# Patient Record
Sex: Male | Born: 1960
Health system: Southern US, Community
[De-identification: ages and names within clinical notes are randomized; demographics above are authoritative.]

## PROBLEM LIST (undated history)

## (undated) DIAGNOSIS — M549 Dorsalgia, unspecified: Secondary | ICD-10-CM

## (undated) DIAGNOSIS — E785 Hyperlipidemia, unspecified: Secondary | ICD-10-CM

## (undated) DIAGNOSIS — Z9189 Other specified personal risk factors, not elsewhere classified: Secondary | ICD-10-CM

## (undated) DIAGNOSIS — Z87442 Personal history of urinary calculi: Secondary | ICD-10-CM

## (undated) DIAGNOSIS — N529 Male erectile dysfunction, unspecified: Secondary | ICD-10-CM

## (undated) DIAGNOSIS — U071 COVID-19: Secondary | ICD-10-CM

## (undated) DIAGNOSIS — M810 Age-related osteoporosis without current pathological fracture: Secondary | ICD-10-CM

## (undated) DIAGNOSIS — I10 Essential (primary) hypertension: Secondary | ICD-10-CM

## (undated) HISTORY — DX: Hyperlipidemia, unspecified: E78.5

## (undated) HISTORY — PX: FRACTURE SURGERY: SHX138

## (undated) HISTORY — PX: CYST EXCISION: SHX5701

## (undated) HISTORY — DX: Other specified personal risk factors, not elsewhere classified: Z91.89

## (undated) HISTORY — DX: Male erectile dysfunction, unspecified: N52.9

## (undated) HISTORY — DX: Dorsalgia, unspecified: M54.9

## (undated) HISTORY — DX: Age-related osteoporosis without current pathological fracture: M81.0

## (undated) HISTORY — PX: SPINE SURGERY: SHX786

## (undated) HISTORY — PX: TONSILLECTOMY: SUR1361

## (undated) HISTORY — DX: Essential (primary) hypertension: I10

## (undated) HISTORY — PX: BACK SURGERY: SHX140

## (undated) HISTORY — PX: VASECTOMY: SHX75

---

## 1993-06-10 DIAGNOSIS — Z87442 Personal history of urinary calculi: Secondary | ICD-10-CM

## 1993-06-10 HISTORY — DX: Personal history of urinary calculi: Z87.442

## 2000-09-30 ENCOUNTER — Ambulatory Visit (HOSPITAL_COMMUNITY): Admission: RE | Admit: 2000-09-30 | Discharge: 2000-09-30 | Payer: Self-pay | Admitting: Neurological Surgery

## 2000-09-30 ENCOUNTER — Encounter: Payer: Self-pay | Admitting: Neurological Surgery

## 2000-10-02 ENCOUNTER — Encounter: Admission: RE | Admit: 2000-10-02 | Discharge: 2000-11-25 | Payer: Self-pay | Admitting: Neurological Surgery

## 2001-03-02 ENCOUNTER — Encounter: Payer: Self-pay | Admitting: Neurological Surgery

## 2001-03-02 ENCOUNTER — Ambulatory Visit (HOSPITAL_COMMUNITY): Admission: RE | Admit: 2001-03-02 | Discharge: 2001-03-02 | Payer: Self-pay | Admitting: Neurological Surgery

## 2001-03-24 ENCOUNTER — Encounter: Payer: Self-pay | Admitting: Internal Medicine

## 2001-03-24 ENCOUNTER — Ambulatory Visit (HOSPITAL_COMMUNITY): Admission: RE | Admit: 2001-03-24 | Discharge: 2001-03-24 | Payer: Self-pay | Admitting: Internal Medicine

## 2003-03-24 ENCOUNTER — Encounter: Payer: Self-pay | Admitting: Emergency Medicine

## 2003-03-24 ENCOUNTER — Observation Stay (HOSPITAL_COMMUNITY): Admission: EM | Admit: 2003-03-24 | Discharge: 2003-03-25 | Payer: Self-pay | Admitting: Emergency Medicine

## 2003-03-25 ENCOUNTER — Encounter: Payer: Self-pay | Admitting: Internal Medicine

## 2004-05-29 ENCOUNTER — Ambulatory Visit: Payer: Self-pay | Admitting: Internal Medicine

## 2004-05-30 ENCOUNTER — Ambulatory Visit: Payer: Self-pay | Admitting: Internal Medicine

## 2005-09-04 ENCOUNTER — Emergency Department (HOSPITAL_COMMUNITY): Admission: EM | Admit: 2005-09-04 | Discharge: 2005-09-04 | Payer: Self-pay | Admitting: Emergency Medicine

## 2006-09-08 ENCOUNTER — Ambulatory Visit: Payer: Self-pay | Admitting: Internal Medicine

## 2006-09-10 LAB — CONVERTED CEMR LAB
Albumin: 4.5 g/dL (ref 3.5–5.2)
BUN: 14 mg/dL (ref 6–23)
Basophils Absolute: 0 10*3/uL (ref 0.0–0.1)
Bilirubin Urine: NEGATIVE
CO2: 32 meq/L (ref 19–32)
Chloride: 98 meq/L (ref 96–112)
Cholesterol: 213 mg/dL (ref 0–200)
Creatinine, Ser: 1.1 mg/dL (ref 0.4–1.5)
Eosinophils Absolute: 0.1 10*3/uL (ref 0.0–0.6)
Eosinophils Relative: 2.2 % (ref 0.0–5.0)
Ketones, ur: NEGATIVE mg/dL
Monocytes Relative: 11.1 % — ABNORMAL HIGH (ref 3.0–11.0)
Neutro Abs: 3.1 10*3/uL (ref 1.4–7.7)
Neutrophils Relative %: 50.6 % (ref 43.0–77.0)
Nitrite: NEGATIVE
Sodium: 136 meq/L (ref 135–145)
Specific Gravity, Urine: 1.015 (ref 1.000–1.03)
Urobilinogen, UA: 0.2 (ref 0.0–1.0)
VLDL: 15 mg/dL (ref 0–40)
pH: 8 (ref 5.0–8.0)

## 2006-10-24 ENCOUNTER — Emergency Department (HOSPITAL_COMMUNITY): Admission: EM | Admit: 2006-10-24 | Discharge: 2006-10-24 | Payer: Self-pay | Admitting: Emergency Medicine

## 2007-10-14 ENCOUNTER — Encounter: Payer: Self-pay | Admitting: *Deleted

## 2007-10-14 DIAGNOSIS — M549 Dorsalgia, unspecified: Secondary | ICD-10-CM | POA: Insufficient documentation

## 2007-10-14 DIAGNOSIS — F528 Other sexual dysfunction not due to a substance or known physiological condition: Secondary | ICD-10-CM | POA: Insufficient documentation

## 2007-10-14 DIAGNOSIS — Z9089 Acquired absence of other organs: Secondary | ICD-10-CM | POA: Insufficient documentation

## 2007-10-14 DIAGNOSIS — I10 Essential (primary) hypertension: Secondary | ICD-10-CM | POA: Insufficient documentation

## 2007-10-14 DIAGNOSIS — Z9189 Other specified personal risk factors, not elsewhere classified: Secondary | ICD-10-CM | POA: Insufficient documentation

## 2007-12-17 ENCOUNTER — Telehealth: Payer: Self-pay | Admitting: Internal Medicine

## 2008-03-11 ENCOUNTER — Ambulatory Visit: Payer: Self-pay | Admitting: Internal Medicine

## 2008-03-11 LAB — CONVERTED CEMR LAB
AST: 27 units/L (ref 0–37)
Albumin: 4.3 g/dL (ref 3.5–5.2)
Basophils Absolute: 0.1 10*3/uL (ref 0.0–0.1)
Bilirubin Urine: NEGATIVE
Bilirubin, Direct: 0.2 mg/dL (ref 0.0–0.3)
Eosinophils Relative: 3.1 % (ref 0.0–5.0)
GFR calc non Af Amer: 85 mL/min
HCT: 40.6 % (ref 39.0–52.0)
Hemoglobin, Urine: NEGATIVE
Leukocytes, UA: NEGATIVE
MCHC: 34.8 g/dL (ref 30.0–36.0)
MCV: 90 fL (ref 78.0–100.0)
Monocytes Absolute: 0.6 10*3/uL (ref 0.1–1.0)
Monocytes Relative: 9.8 % (ref 3.0–12.0)
Neutro Abs: 3.4 10*3/uL (ref 1.4–7.7)
Nitrite: NEGATIVE
PSA: 0.59 ng/mL (ref 0.10–4.00)
TSH: 1.47 microintl units/mL (ref 0.35–5.50)
Total Protein: 7.6 g/dL (ref 6.0–8.3)
Triglycerides: 90 mg/dL (ref 0–149)
Urine Glucose: NEGATIVE mg/dL
Urobilinogen, UA: 0.2 (ref 0.0–1.0)
VLDL: 18 mg/dL (ref 0–40)

## 2009-01-17 ENCOUNTER — Telehealth: Payer: Self-pay | Admitting: Internal Medicine

## 2009-04-14 ENCOUNTER — Ambulatory Visit: Payer: Self-pay | Admitting: Internal Medicine

## 2009-04-14 LAB — CONVERTED CEMR LAB
AST: 22 units/L (ref 0–37)
Albumin: 4.5 g/dL (ref 3.5–5.2)
BUN: 15 mg/dL (ref 6–23)
Basophils Absolute: 0 10*3/uL (ref 0.0–0.1)
Calcium: 9.6 mg/dL (ref 8.4–10.5)
Chloride: 99 meq/L (ref 96–112)
Creatinine, Ser: 1.1 mg/dL (ref 0.4–1.5)
Glucose, Bld: 105 mg/dL — ABNORMAL HIGH (ref 70–99)
HDL: 55.5 mg/dL (ref >39.00)
Ketones, ur: NEGATIVE mg/dL
MCV: 92.1 fL (ref 78.0–100.0)
Monocytes Absolute: 0.6 10*3/uL (ref 0.1–1.0)
Neutrophils Relative %: 59 % (ref 43.0–77.0)
Nitrite: NEGATIVE
RBC: 4.38 M/uL (ref 4.22–5.81)
Specific Gravity, Urine: 1.01 (ref 1.000–1.030)
TSH: 1.87 microintl units/mL (ref 0.35–5.50)
Urine Glucose: NEGATIVE mg/dL
VLDL: 14 mg/dL (ref 0.0–40.0)
WBC: 6.5 10*3/uL (ref 4.5–10.5)

## 2009-04-18 ENCOUNTER — Ambulatory Visit: Payer: Self-pay | Admitting: Internal Medicine

## 2009-04-18 DIAGNOSIS — E785 Hyperlipidemia, unspecified: Secondary | ICD-10-CM | POA: Insufficient documentation

## 2009-05-10 ENCOUNTER — Telehealth: Payer: Self-pay | Admitting: Internal Medicine

## 2009-06-01 ENCOUNTER — Ambulatory Visit: Payer: Self-pay | Admitting: Internal Medicine

## 2009-06-01 LAB — CONVERTED CEMR LAB
Cholesterol: 191 mg/dL (ref 0–200)
HDL: 69.2 mg/dL (ref >39.00)

## 2009-06-05 ENCOUNTER — Encounter: Payer: Self-pay | Admitting: Internal Medicine

## 2010-03-16 ENCOUNTER — Ambulatory Visit: Payer: Self-pay | Admitting: Internal Medicine

## 2010-04-17 ENCOUNTER — Ambulatory Visit: Payer: Self-pay | Admitting: Internal Medicine

## 2010-04-17 LAB — CONVERTED CEMR LAB
Basophils Relative: 0.6 % (ref 0.0–3.0)
Bilirubin Urine: NEGATIVE
Bilirubin, Direct: 0.1 mg/dL (ref 0.0–0.3)
CO2: 30 meq/L (ref 19–32)
Calcium: 10.1 mg/dL (ref 8.4–10.5)
Creatinine, Ser: 0.9 mg/dL (ref 0.4–1.5)
Eosinophils Absolute: 0.3 10*3/uL (ref 0.0–0.7)
GFR calc non Af Amer: 90.32 mL/min (ref >60)
HCT: 39.3 % (ref 39.0–52.0)
HDL: 62.2 mg/dL (ref >39.00)
Hemoglobin, Urine: NEGATIVE
LDL Cholesterol: 99 mg/dL (ref 0–99)
Lymphocytes Relative: 35.6 % (ref 12.0–46.0)
Lymphs Abs: 2.5 10*3/uL (ref 0.7–4.0)
MCV: 91.4 fL (ref 78.0–100.0)
Monocytes Absolute: 0.7 10*3/uL (ref 0.1–1.0)
Neutro Abs: 3.4 10*3/uL (ref 1.4–7.7)
Neutrophils Relative %: 49.4 % (ref 43.0–77.0)
Platelets: 272 10*3/uL (ref 150.0–400.0)
Potassium: 5.4 meq/L — ABNORMAL HIGH (ref 3.5–5.1)
Total CHOL/HDL Ratio: 3
Total Protein, Urine: NEGATIVE mg/dL
Urine Glucose: NEGATIVE mg/dL
VLDL: 22.8 mg/dL (ref 0.0–40.0)
WBC: 6.9 10*3/uL (ref 4.5–10.5)
pH: 5.5 (ref 5.0–8.0)

## 2010-04-23 ENCOUNTER — Encounter: Payer: Self-pay | Admitting: Internal Medicine

## 2010-04-23 ENCOUNTER — Ambulatory Visit: Payer: Self-pay | Admitting: Internal Medicine

## 2010-07-10 NOTE — Assessment & Plan Note (Signed)
Summary: cpx/bcbs/jss   Vital Signs:  Patient profile:   50 year old male Height:      68 inches Weight:      163 pounds BMI:     24.87 O2 Sat:      97 % on Room air Temp:     98.5 degrees F oral Pulse rate:   79 / minute BP sitting:   124 / 90  (left arm) Cuff size:   regular  Vitals Entered By: Bill Salinas CMA (April 23, 2010 1:38 PM)  O2 Flow:  Room air CC: cpx/ ab  Vision Screening:      Vision Comments: normal eye exam with Dr Lorin Picket sept 2011   Primary Care Provider:  Jacques Navy MD  CC:  cpx/ ab.  History of Present Illness: Patient presents for annual physical exam. He is doing well with no new medical problems Shoulder/upper back pain is a lot better. He is interested in Vasectomy.   Current Medications (verified): 1)  Lotensin Hct 20-25 Mg  Tabs (Benazepril-Hydrochlorothiazide) .... Take Once Daily 2)  Simvastatin 20 Mg Tabs (Simvastatin) .Marland Kitchen.. 1 By Mouth Qpm For Cholesterol 3)  Cialis 20 Mg Tabs (Tadalafil) .Marland Kitchen.. 1 Tab Prn 4)  Cyclobenzaprine Hcl 10 Mg Tabs (Cyclobenzaprine Hcl) .Marland Kitchen.. 1 By Mouth Three Times A Day For Back Spasm  Allergies (verified): No Known Drug Allergies  Past History:  Past Medical History: Last updated: 10/14/2007 Hx of ERECTILE DYSFUNCTION, MILD (ICD-302.72) HYPERTENSION (ICD-401.9) Hx of BACK PAIN (ICD-724.5) CHICKENPOX, HX OF (ICD-V15.9)  Past Surgical History: Last updated: 10/14/2007 TONSILLECTOMY, HX OF (ICD-V45.79)    Family History: Last updated: 2008/03/14 Father - deceased @ 45 - leukemia, CAD/PCI-PTCA Mother - 1938: Parkinson's, macular degeneration Neg- Prostate or colon cancer  Social History: [employer] 2nd marriage '06-'08; 2rd marriage Dec '10 1 son - '91, step-son '91, step-daughter '93 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    Physical Exam  General:  WNWD white male in no distress Head:  normocephalic, atraumatic, and no abnormalities observed.   Eyes:  vision grossly intact, pupils equal, pupils round, pupils reactive to light, corneas and lenses clear, and no injection.   Ears:  External ear exam shows no significant lesions or deformities.  Otoscopic examination reveals clear canals, tympanic membranes are intact bilaterally without bulging, retraction, inflammation or discharge. Hearing is grossly normal bilaterally. Nose:  no external deformity and no external erythema.   Mouth:  Oral mucosa and oropharynx without lesions or exudates.  Teeth in good repair. Neck:  supple, full ROM, no masses, no thyromegaly, and no carotid bruits.   Chest Wall:  no deformities, no tenderness, and no masses.   Lungs:  Normal respiratory effort, chest expands symmetrically. Lungs are clear to auscultation, no crackles or wheezes. Heart:  Normal rate and regular rhythm. S1 and S2 normal without gallop, murmur, click, rub or other extra sounds. Abdomen:  soft, non-tender, normal bowel sounds, no distention, no guarding, and no hepatomegaly.   Prostate:  deferred to normal PSA Msk:  normal ROM, no joint tenderness, no joint swelling, no joint warmth, no joint  deformities, and no joint instability.   Pulses:  2+ radial and DP pulses Extremities:  No clubbing, cyanosis, edema, or deformity noted with normal full range of motion of all joints.   Neurologic:  alert & oriented X3, cranial nerves II-XII intact, strength normal in all extremities, sensation intact to light touch, gait normal, and DTRs symmetrical  and normal.   Skin:  turgor normal, color normal, no rashes, no suspicious lesions, and no ulcerations.   Cervical Nodes:  no anterior cervical adenopathy and no posterior cervical adenopathy.   Psych:  Oriented X3, memory intact for recent and remote, normally interactive, good eye contact, and not anxious appearing.     Impression & Recommendations:  Problem # 1:  HYPERLIPIDEMIA, MILD (ICD-272.4) Excellent response to low dose simvastatin with LDL 99!  Plan - continue present dose.  His updated medication list for this problem includes:    Simvastatin 20 Mg Tabs (Simvastatin) .Marland Kitchen... 1 by mouth qpm for cholesterol  Problem # 2:  HYPERTENSION (ICD-401.9)  His updated medication list for this problem includes:    Lotensin Hct 20-25 Mg Tabs (Benazepril-hydrochlorothiazide) .Marland Kitchen... Take once daily  BP today: 124/90 Prior BP: 120/82 (03/16/2010)  Very good control. Tolerating medication without problems.  Plan - continue present medications.  Problem # 3:  Hx of BACK PAIN (ICD-724.5) Doing well.  Plan - myofascial release using tennis ball or alabaster egg to the rhomboids and to the pectoralis major insertion.  His updated medication list for this problem includes:    Cyclobenzaprine Hcl 10 Mg Tabs (Cyclobenzaprine hcl) .Marland Kitchen... 1 by mouth three times a day for back spasm  Problem # 4:  Preventive Health Care (ICD-V70.0) Unremarkable history. Exam is normal. Lab results are excellent. Current with immunizations. Is at the age for colonoscopy. Will refer to GI. EKG with T-wave inversion V1 and V2 which is most likely a normal variant given absence of signs or symptoms of ischemia.   In summary - Patient is doing well.             Will refer to Dr. Isabel Caprice for vasectomy  Complete Medication List: 1)  Lotensin Hct 20-25 Mg Tabs (Benazepril-hydrochlorothiazide) .... Take once daily 2)  Simvastatin 20 Mg Tabs (Simvastatin) .Marland Kitchen.. 1 by mouth qpm for cholesterol 3)  Cialis 20 Mg Tabs  (Tadalafil) .Marland Kitchen.. 1 tab prn 4)  Cyclobenzaprine Hcl 10 Mg Tabs (Cyclobenzaprine hcl) .Marland Kitchen.. 1 by mouth three times a day for back spasm  Other Orders: Admin 1st Vaccine (16109) Flu Vaccine 6yrs + (60454) Urology Referral (Urology) EKG w/ Interpretation (93000)   Patient: Devin Stanton Note: All result statuses are Final unless otherwise noted.  Tests: (1) Lipid Panel (LIPID)   Cholesterol               184 mg/dL                   0-981     ATP III Classification            Desirable:  < 200 mg/dL                    Borderline High:  200 - 239 mg/dL               High:  > = 240 mg/dL   Triglycerides             114.0 mg/dL  0.0-149.0     Normal:  <150 mg/dL     Borderline High:  045 - 199 mg/dL   HDL                       40.98 mg/dL                 >11.91   VLDL Cholesterol          22.8 mg/dL                  4.7-82.9   LDL Cholesterol           99 mg/dL                    5-62  CHO/HDL Ratio:  CHD Risk                             3                    Men          Women     1/2 Average Risk     3.4          3.3     Average Risk          5.0          4.4     2X Average Risk          9.6          7.1     3X Average Risk          15.0          11.0                           Tests: (2) BMP (METABOL)   Sodium                    138 mEq/L                   135-145   Potassium            [H]  5.4 mEq/L                   3.5-5.1     specimen not hemolyzed   Chloride                  101 mEq/L                   96-112   Carbon Dioxide            30 mEq/L                    19-32   Glucose              [H]  104 mg/dL                   13-08   BUN                       22 mg/dL                    6-57   Creatinine                0.9  mg/dL                   1.6-1.0   Calcium                   10.1 mg/dL                  9.6-04.5   GFR                       90.32 mL/min                >60  Tests: (3) CBC Platelet w/Diff (CBCD)   White Cell Count          6.9 K/uL                     4.5-10.5   Red Cell Count            4.30 Mil/uL                 4.22-5.81   Hemoglobin                13.5 g/dL                   40.9-81.1   Hematocrit                39.3 %                      39.0-52.0   MCV                       91.4 fl                     78.0-100.0   MCHC                      34.4 g/dL                   91.4-78.2   RDW                       12.5 %                      11.5-14.6   Platelet Count            272.0 K/uL                  150.0-400.0   Neutrophil %              49.4 %                      43.0-77.0   Lymphocyte %              35.6 %                      12.0-46.0   Monocyte %                10.8 %                      3.0-12.0   Eosinophils%              3.6 %  0.0-5.0   Basophils %               0.6 %                       0.0-3.0   Neutrophill Absolute      3.4 K/uL                    1.4-7.7   Lymphocyte Absolute       2.5 K/uL                    0.7-4.0   Monocyte Absolute         0.7 K/uL                    0.1-1.0  Eosinophils, Absolute                             0.3 K/uL                    0.0-0.7   Basophils Absolute        0.0 K/uL                    0.0-0.1  Tests: (4) Hepatic/Liver Function Panel (HEPATIC)   Total Bilirubin           0.7 mg/dL                   5.3-6.6   Direct Bilirubin          0.1 mg/dL                   4.4-0.3   Alkaline Phosphatase      66 U/L                      39-117   AST                       26 U/L                      0-37   ALT                       31 U/L                      0-53   Total Protein             7.3 g/dL                    4.7-4.2   Albumin                   4.5 g/dL                    5.9-5.6  Tests: (5) TSH (TSH)   FastTSH                   1.40 uIU/mL                 0.35-5.50  Tests: (6) UDip Only (UDIP)   Color                     LT. YELLOW       RANGE:  Yellow;Lt.  Yellow   Clarity                   CLEAR                       Clear   Specific  Gravity          1.025                       1.000 - 1.030   Urine Ph                  5.5                         5.0-8.0   Protein                   NEGATIVE                    Negative   Urine Glucose             NEGATIVE                    Negative   Ketones                   NEGATIVE                    Negative   Urine Bilirubin           NEGATIVE                    Negative   Blood                     NEGATIVE                    Negative   Urobilinogen              0.2                         0.0 - 1.0   Leukocyte Esterace        NEGATIVE                    Negative   Nitrite                   NEGATIVE                    Negative  Tests: (7) Prostate Specific Antigen (PSA)   PSA-Hyb                   0.58 ng/mL                  0.10-4.00  Orders Added: 1)  Admin 1st Vaccine [90471] 2)  Flu Vaccine 42yrs + [29562] 3)  Urology Referral [Urology] 4)  Est. Patient 40-64 years [99396] 5)  EKG w/ Interpretation [93000]  Flu Vaccine Consent Questions     Do you have a history of severe allergic reactions to this vaccine? no    Any prior history of allergic reactions to egg and/or gelatin? no    Do you have a sensitivity to the preservative Thimersol? no    Do you have a past history of Guillan-Barre Syndrome? no    Do you  currently have an acute febrile illness? no    Have you ever had a severe reaction to latex? no    Vaccine information given and explained to patient? yes    Are you currently pregnant? no    Lot Number:[id number]   Exp Date:12/08/2010   Site Given  Left Deltoid [id number]]          .[email or web address]

## 2010-07-10 NOTE — Assessment & Plan Note (Signed)
Summary: ?pulled muscle in the middle of his back-lb   Vital Signs:  Patient profile:   50 year old male Height:      68 inches Weight:      160 pounds BMI:     24.42 O2 Sat:      96 % on Room air Temp:     97.3 degrees F oral Pulse rate:   74 / minute BP sitting:   120 / 82  (left arm) Cuff size:   regular  Vitals Entered By: Bill Salinas CMA (March 16, 2010 4:13 PM)  O2 Flow:  Room air CC: ov for evaluation of pulled muscle in back/ ab   Primary Care Provider:  Jacques Navy MD  CC:  ov for evaluation of pulled muscle in back/ ab.  History of Present Illness: Patient presents for acute on-set low thoracic level back pain. He had pain associated with a golf swing. He has had no paresthesia, muscle weakness. He reports pain with movement and with deep inspiration. He has pain if he coughs or laughs. No prior injury of this type.  Current Medications (verified): 1)  Lotensin Hct 20-25 Mg  Tabs (Benazepril-Hydrochlorothiazide) .... Take Once Daily 2)  Simvastatin 20 Mg Tabs (Simvastatin) .Marland Kitchen.. 1 By Mouth Qpm For Cholesterol 3)  Cialis 20 Mg Tabs (Tadalafil) .Marland Kitchen.. 1 Tab Prn  Allergies (verified): No Known Drug Allergies  Past History:  Past Medical History: Last updated: 10/14/2007 Hx of ERECTILE DYSFUNCTION, MILD (ICD-302.72) HYPERTENSION (ICD-401.9) Hx of BACK PAIN (ICD-724.5) CHICKENPOX, HX OF (ICD-V15.9)  Past Surgical History: Last updated: 10/14/2007 TONSILLECTOMY, HX OF (ICD-V45.79)   PSH reviewed for relevance, FH reviewed for relevance  Review of Systems  The patient denies anorexia, weight loss, chest pain, syncope, dyspnea on exertion, abdominal pain, severe indigestion/heartburn, muscle weakness, difficulty walking, and enlarged lymph nodes.    Physical Exam  General:  WNWD white male who is uncomfortable but in no acute distress Head:  normocephalic and atraumatic.   Neck:  supple and full ROM.   Chest Wall:  no deformities.   Lungs:  normal  respiratory effort.   Heart:  normal rate and regular rhythm.   Msk:  back exam: able to stand without assist, flex to 160 degrees, nl gait, toe/heel walk, step up to exam. Nl DTR's at patellar tendon, nl sensation. Pulses:  2+ radial Neurologic:  alert & oriented X3 and cranial nerves II-XII intact.   Skin:  turgor normal and color normal.   Psych:  normally interactive, good eye contact, and not anxious appearing.     Impression & Recommendations:  Problem # 1:  BACK STRAIN, ACUTE (ICD-847.9) Exam without radicular findings. suspect pulled muscles from forcefull golf swing.  Plan - cyclobenzaprine 10mg  three times a day as needed ( not to fly if taking)           NSIADs - alleve 2 tabs two times a day           heat and massage.  Complete Medication List: 1)  Lotensin Hct 20-25 Mg Tabs (Benazepril-hydrochlorothiazide) .... Take once daily 2)  Simvastatin 20 Mg Tabs (Simvastatin) .Marland Kitchen.. 1 by mouth qpm for cholesterol 3)  Cialis 20 Mg Tabs (Tadalafil) .Marland Kitchen.. 1 tab prn 4)  Cyclobenzaprine Hcl 10 Mg Tabs (Cyclobenzaprine hcl) .Marland Kitchen.. 1 by mouth three times a day for back spasm Prescriptions: CYCLOBENZAPRINE HCL 10 MG TABS (CYCLOBENZAPRINE HCL) 1 by mouth three times a day for back spasm  #30 x 1  Entered and Authorized by:   Jacques Navy MD   Signed by:   Jacques Navy MD on 03/16/2010   Method used:   Electronically to        [facility]* (retail)       [address] [place], [place]  [address]       Ph: [phone] or [phone]       Fax: [phone]   RxID:   [id number]

## 2010-10-26 NOTE — Discharge Summary (Signed)
NAME:  Devin Stanton, Devin Stanton                        ACCOUNT NO.:  [id number]   MEDICAL RECORD NO.:  [id number]                   PATIENT TYPE:  INP   LOCATION:  2029                                 FACILITY:  [facility]   PHYSICIAN:  Dr. Juanda Chance                          DATE OF BIRTH:  [birth year]   DATE OF ADMISSION:  03/24/2003  DATE OF DISCHARGE:  03/25/2003                           DISCHARGE SUMMARY - REFERRING   HISTORY:  Devin Stanton is a 50 year old white male who presented to [facility] Emergency Room complaining of chest discomfort.  He stated that since  the preceding Monday afternoon, he has had a left-sided axilla gas pain or  indigestion.  It initially started while he was spreading pine straw Monday  afternoon and has been constant ever since.  It has waxed and waned between  a 6 and a 3, and he feels that it is worse with minimal activity.  However,  he denies any change with movement or prior occurrences.  He has also noted  shortness of breath since Monday, which has also been constant and again  worse with activity.  He denies any cough or pleuritic component.  He denies  associated nausea, vomiting, or diaphoresis.  He called Dr. Debby Bud' office  on Wednesday and left a message, however, has not heard from him.  His  fiancee called Dr. Trisha Mangle, who is the patient's sister, who in turn  referred him to the emergency room for evaluation.  Once in the emergency  room, he was placed on IV nitroglycerin which reduced his discomfort to a 1.   PAST MEDICAL HISTORY:  Notable for hypertension, hyperlipidemia with the  last check approximately three years ago.  He also has chronic back pain and  he feels that he has disk problems in S1, S3, S4, and T3.  His history is  also notable for an early family history with his brother having coronary  artery disease prior to the age of 68 and his father having his first PCI in  his 73s.   LABORATORY DATA:  Admission H&H was 15.0 and 44.0, PT  12.6, D-dimer less  than 0.22.  Sodium 137, potassium 4.1, BUN 18, creatinine 1.3.  ER panel x3  was negative for myocardial infarction.  CK, total MB, and troponin were  negative.  Fasting lipids showed a fasting cholesterol of 199, triglycerides  slightly elevated at 158, HDL 54, LDL slightly elevated at 113.  Chest x-ray  did not show any active disease.  EKG showed sinus bradycardia, early R  wave, nonspecific ST-T wave changes.   HOSPITAL COURSE:  Devin Stanton was admitted to the unit 2000.  He was  continued on IV nitroglycerin and oxygen as well as a nonsteroidal.  As his  comfort continued, his nitroglycerin was increased once, however, he  complained of severe headache and continued chest  discomfort despite  measures.  On the morning of October 15, he underwent stress Cardiolite  since all his markers were unremarkable.  On stress testing, he exercised  well into stage V using the new Bruce protocol.  The test was terminated  secondary to dyspnea.  He did not have any EKG changes.  Imaging showed an  EF of 50% without wall motion abnormalities, no scarring or signs of  ischemia.  Dr. Juanda Chance felt that since the test was negative, he could be  discharged with followup with Dr. Debby Bud.  We will start him on any  hyperlipidemia agent at this time.  We encouraged the patient to maintain  low-salt-fat-cholesterol diet and to possibly have his cholesterol panel  checked again in several months for improvement of his triglycerides and  LDL.  If his laboratory values remain elevated, he should consider statin therapy.  His activities were not restricted.  He was asked to make a followup  appointment with Dr. Debby Bud.  He was asked to continue his Benazepril HCT  20/25 daily.  We asked him to start baby aspirin 81 mg daily and Motrin 800  mg t.i.d. for one week.      Joellyn Rued, P.A. [facility]                    Dr. Juanda Chance    EW/MEDQ  D:  03/25/2003  T:  03/25/2003  Job:  [id number]   cc:    Rosalyn Gess. Norins, M.D. [facility]

## 2010-10-26 NOTE — H&P (Signed)
NAME:  Devin Stanton, Devin Stanton                        ACCOUNT NO.:  [id number]   MEDICAL RECORD NO.:  [id number]                   PATIENT TYPE:  INP   LOCATION:  1826                                 FACILITY:  [facility]   PHYSICIAN:  Olga Millers, M.D.                DATE OF BIRTH:  [birth year]   DATE OF ADMISSION:  03/24/2003  DATE OF DISCHARGE:                                HISTORY & PHYSICAL   HISTORY OF PRESENT ILLNESS:  Devin Stanton is a pleasant 50 year old male  with no prior cardiac history who we were asked to evaluate for chest pain.  The patient typically does not have chest pain with exertion nor does he  have dyspnea on exertion, orthopnea, PND, pedal edema, palpitations,  presyncope, or syncope.  This past Monday, the patient was unloading pine  needles and developed left-sided chest pain.  It is towards the axilla and  radiates to the left lower extremity and also to the left neck area.  The  pain is described as a pressure and indigestion.  There is no associated  nausea, vomiting, or diaphoresis, but there is shortness of breath.  The  pain is not pleuritic nor is it positional, although it does increase with  exertion.  It is not related to food.  His pain has been continuous since  Monday and he presented to the emergency room today.  It did improve with  Percocet.  He also has significant dyspnea on exertion, but he denies any  orthopnea, PND, pedal edema, palpitations, presyncope, or syncope.  He has  not been on any recent trips and he has not injured his legs.   ALLERGIES:  He has no known drug allergies.   MEDICATIONS:  Benazepril hydrochlorothiazide 20/25 mg tablets one p.o.  daily.   PAST MEDICAL HISTORY:  Significant for hypertension and hyperlipidemia.  There is no diabetes mellitus.   PAST SURGICAL HISTORY:  He has had a prior tonsillectomy.  He has also had a  history of back problems.   SOCIAL HISTORY:  He does not smoke.  He does occasionally consume  alcohol.  There is no drug use.   FAMILY HISTORY:  Strongly positive for coronary artery disease.   REVIEW OF SYSTEMS:  He denies any headaches, fevers, or chills.  There is no  productive cough or hemoptysis.  There is no dysphagia, odynophagia, melena,  or hematochezia.  There is no dysuria or hematuria.  There is no rash or  seizure activity.  There is no orthopnea, PND, or pedal edema.  There is no  claudication.  The remaining systems are negative.   PHYSICAL EXAMINATION:  VITAL SIGNS:  Blood pressure 102/58, pulse 70.  He is  100% on 2 L.  GENERAL APPEARANCE:  He is well developed, well nourished, and in no acute  distress.  He does not appear to be depressed.  There is no peripheral  clubbing.  HEENT:  Unremarkable with normal eyelids.  NECK:  Supple.  No lymphadenopathy bilaterally.  There are no bruits noted.  There is no jugular venous distention and no thyromegaly noted.  CHEST:  Clear to auscultation.  Normal expansion.  CARDIOVASCULAR:  Regular rate and rhythm.  Normal S1 and S2.  There are no  murmurs, rubs, or gallops noted.  ABDOMEN:  Nontender, nondistended.  Positive bowel sounds.  No  hepatosplenomegaly.  No masses appreciated.  There is no abdominal bruit.  Of note, I cannot appreciate tenderness in the left chest area.  EXTREMITIES:  He has 2+ femoral pulses bilaterally and no bruits.  His  extremities show no edema and I can palpate no cords.  He had a negative  Homan's bilaterally.  He has 2+ posterior tibial pulses bilaterally.  NEUROLOGIC:  Exam is grossly intact.   LABORATORY DATA:  His electrocardiogram shows normal sinus rhythm with no  acute ST changes.  The chest x-ray shows no acute disease.  His hemoglobin  and hematocrit are 15 and 44, respectively.  His BUN and creatinine are 18  and 1.3, respectively.  His initial set of enzymes are negative.   DIAGNOSES:  1. Atypical chest pain.  2. Hypertension.  3. Hyperlipidemia.   PLAN:  Devin Stanton  presents with complaints of chest pain.  His symptoms  are somewhat atypical in that they have been continuous for the past 48  hours and his initial enzymes and electrocardiogram are unremarkable.  The  pain may be musculoskeletal in etiology.  We will admit and rule out  myocardial infarction with serial enzymes.  If they are negative, we will  plan a stress Cardiolite tomorrow morning for risk stratification as he does  have multiple risk factors.  We will also check a D-dimer to screen for  pulmonary embolus, although he does not have risk factors.  However, he does  have significant dyspnea.  Finally, we will add a nonsteroidal as he does  have pain in the left neck and left upper extremity area and this may be  musculoskeletal.  We will make further recommendations once we have the  above information.                                                Olga Millers, M.D.    BC/MEDQ  D:  03/24/2003  T:  03/24/2003  Job:  [id number]

## 2010-10-26 NOTE — Assessment & Plan Note (Signed)
[facility]                           PRIMARY CARE OFFICE NOTE   NAME:Devin Stanton, Devin Stanton                     MRN:          [id number]  DATE:09/08/2006                            DOB:          1961/01/15    Devin Stanton is a pleasant 50 year old Caucasian gentleman who presents  for a physical exam.  He was last seen May 30, 2004.  In the  interval the patient has been well with no medical problems of note,  except for mild positional vertigo.  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  He has q. 6 months flight  physical.   PAST SURGICAL HISTORY:  Tonsillectomy.   PAST MEDICAL HISTORY:  1. Chickenpox.  2. Fully immunized.  3. Back pain with radiation to his legs with a full neurosurgical      evaluation, which was unremarkable.  No surgeries were required.      Symptoms are self limited.  4. Hypertension.   CURRENT MEDICATIONS:  Lotensin 20/25 mg once daily.   HABITS:  Tobacco none.  Alcohol rare.   ALLERGIES:  No known drug allergies.   FAMILY HISTORY:  Positive for coronary artery disease, negative for  colon cancer, prostate cancer, diabetes.   SOCIAL HISTORY:  The patient attended [employer].  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  He did have assignments including [place].  Currently he  is a Recruitment consultant for EMS.  He is married now for 2 years, which is  his second marriage.  He has a son 33, he has a stepson 16 and  stepdaughter 15.  His marriage is in good condition.   REVIEW OF SYSTEMS:  Negative for any constitutional, cardiovascular,  respiratory, GI, GU or musculoskeletal complaints, except for mild back  pain.   EXAMINATION:  Temperature was 98.2, blood pressure 132/82, pulse 76,  weight 167.  GENERAL APPEARANCE:  A well-nourished, well-developed gentleman in no  acute distress.  HEENT:  Normocephalic, atraumatic, EAC's  and TM's were normal,  oropharynx with native dentition in good repair, no buccal or palate  lesions were noted, posterior pharynx was clear, conjunctivae and  sclerae were clear, PERRLA, EOMI. Funduscopic exam deferred to  ophthalmology.  NECK:  Supple without thyromegaly.  NODES:  No adenopathy was noted in the cervical, supraclavicular,  inguinal regions.  CHEST:  No CVA tenderness.  LUNGS:  Clear with no rales, wheezes or rhonchi.  CARDIOVASCULAR:  With 2+ radial pulses, no jugular venous distension or  carotid bruits.  He had a quiet pericardium with a regular rate and  rhythm without murmurs, rubs or gallops.  ABDOMEN:  Soft, no guarding, no rebound, no organosplenomegaly was  noted.  GENITALIA:  Normal bilateral distended testicles without masses.  RECTAL:  Normal sphincter tone was noted, prostate was smooth, flat with  no nodules.  EXTREMITIES:  Without clubbing, cyanosis, edema or deformity.  NEUROLOGIC:  Nonfocal.   Laboratory ordered and pending.  Includes a lipid panel, comprehensive  metabolic panel.  ASSESSMENT/PLAN:  This is a healthy appearing gentleman with no active  medical problems, except for mild hypertension that is well controlled  on his present medical regimen.  He will continue the same and refill  prescriptions provided.   Orthostatic hypotension, I discussed with the patient carotid  dysautonomia, reassured him this is a minor problem that did not require  further evaluation or workup.   The patient has asked to return to see me in 2 years for general  physical exam or on an as needed basis.  We will be happy to fill his  prescriptions for him without office visit in the interval.     Rosalyn Gess. Norins, MD  Electronically Signed    MEN/MedQ  DD: 09/09/2006  DT: 09/09/2006  Job #: [id number]   cc:   Ilene Qua

## 2011-01-02 ENCOUNTER — Encounter (HOSPITAL_COMMUNITY): Payer: Self-pay

## 2011-01-02 ENCOUNTER — Ambulatory Visit (INDEPENDENT_AMBULATORY_CARE_PROVIDER_SITE_OTHER): Payer: Federal, State, Local not specified - PPO | Admitting: Internal Medicine

## 2011-01-02 ENCOUNTER — Other Ambulatory Visit (INDEPENDENT_AMBULATORY_CARE_PROVIDER_SITE_OTHER): Payer: Federal, State, Local not specified - PPO

## 2011-01-02 ENCOUNTER — Encounter: Payer: Self-pay | Admitting: Internal Medicine

## 2011-01-02 ENCOUNTER — Ambulatory Visit (HOSPITAL_COMMUNITY)
Admission: RE | Admit: 2011-01-02 | Discharge: 2011-01-02 | Disposition: A | Discharge status: Home / Self Care | Payer: Federal, State, Local not specified - PPO | Source: Ambulatory Visit | Attending: Internal Medicine | Admitting: Internal Medicine

## 2011-01-02 DIAGNOSIS — N509 Disorder of male genital organs, unspecified: Secondary | ICD-10-CM

## 2011-01-02 DIAGNOSIS — N50811 Right testicular pain: Secondary | ICD-10-CM

## 2011-01-02 DIAGNOSIS — I700 Atherosclerosis of aorta: Secondary | ICD-10-CM | POA: Insufficient documentation

## 2011-01-02 DIAGNOSIS — R339 Retention of urine, unspecified: Secondary | ICD-10-CM | POA: Insufficient documentation

## 2011-01-02 DIAGNOSIS — R197 Diarrhea, unspecified: Secondary | ICD-10-CM

## 2011-01-02 DIAGNOSIS — R1031 Right lower quadrant pain: Secondary | ICD-10-CM | POA: Insufficient documentation

## 2011-01-02 DIAGNOSIS — Z87442 Personal history of urinary calculi: Secondary | ICD-10-CM | POA: Insufficient documentation

## 2011-01-02 DIAGNOSIS — N400 Enlarged prostate without lower urinary tract symptoms: Secondary | ICD-10-CM | POA: Insufficient documentation

## 2011-01-02 LAB — COMPREHENSIVE METABOLIC PANEL
ALT: 25 U/L (ref 0–53)
BUN: 17 mg/dL (ref 6–23)
CO2: 31 mEq/L (ref 19–32)
Calcium: 9.5 mg/dL (ref 8.4–10.5)
Chloride: 103 mEq/L (ref 96–112)
Creatinine, Ser: 1 mg/dL (ref 0.4–1.5)
GFR: 84.83 mL/min (ref >60.00)
Glucose, Bld: 105 mg/dL — ABNORMAL HIGH (ref 70–99)
Potassium: 4.9 mEq/L (ref 3.5–5.1)
Total Protein: 7.9 g/dL (ref 6.0–8.3)

## 2011-01-02 LAB — CBC WITH DIFFERENTIAL/PLATELET
Basophils Relative: 0.4 % (ref 0.0–3.0)
Eosinophils Absolute: 0.1 10*3/uL (ref 0.0–0.7)
Eosinophils Relative: 1.5 % (ref 0.0–5.0)
Lymphocytes Relative: 26.5 % (ref 12.0–46.0)
Lymphs Abs: 2 10*3/uL (ref 0.7–4.0)
MCV: 90.6 fl (ref 78.0–100.0)
Monocytes Absolute: 0.8 10*3/uL (ref 0.1–1.0)
Monocytes Relative: 10.8 % (ref 3.0–12.0)
Neutrophils Relative %: 60.8 % (ref 43.0–77.0)
Platelets: 233 10*3/uL (ref 150.0–400.0)
RBC: 4.16 Mil/uL — ABNORMAL LOW (ref 4.22–5.81)
RDW: 12.3 % (ref 11.5–14.6)
WBC: 7.5 10*3/uL (ref 4.5–10.5)

## 2011-01-02 LAB — URINALYSIS
Bilirubin Urine: NEGATIVE
Hgb urine dipstick: NEGATIVE
Leukocytes, UA: NEGATIVE
Nitrite: NEGATIVE
Urine Glucose: NEGATIVE

## 2011-01-02 MED ORDER — SILODOSIN 8 MG PO CAPS: 8.0000 mg | ORAL_CAPSULE | Freq: Every day | ORAL | Status: DC

## 2011-01-02 MED ORDER — IOHEXOL 300 MG/ML  SOLN
100.0000 mL | Freq: Once | INTRAMUSCULAR | Status: AC | PRN
  Administered 2011-01-02: 100 mL via INTRAVENOUS

## 2011-01-02 MED ORDER — CIPROFLOXACIN HCL 500 MG PO TABS: 500.0000 mg | ORAL_TABLET | Freq: Two times a day (BID) | ORAL | Status: AC

## 2011-01-02 NOTE — Assessment & Plan Note (Signed)
Poss stone - get a CT Labs

## 2011-01-02 NOTE — Progress Notes (Signed)
  Subjective:    Patient ID: Devin Stanton, male    DOB: 01/28/1961, 50 y.o.   MRN: [id number]  HPI  C/o LLQ abd pain x 3 d off and 6-7/10; 6 stools a day - large and watery. Pain is irrad in R testicle, severe. Once, his urine flow stopped  Review of Systems  Constitutional: Negative for fever, appetite change, fatigue and unexpected weight change.  HENT: Negative for nosebleeds, congestion, sore throat, sneezing, trouble swallowing and neck pain.   Eyes: Negative for itching and visual disturbance.  Respiratory: Negative for cough.   Cardiovascular: Negative for chest pain, palpitations and leg swelling.  Gastrointestinal: Positive for nausea and diarrhea. Negative for blood in stool and abdominal distention.  Genitourinary: Positive for difficulty urinating and testicular pain (R). Negative for frequency and hematuria.  Musculoskeletal: Negative for back pain, joint swelling and gait problem.  Skin: Negative for rash.  Neurological: Negative for dizziness, tremors, speech difficulty and weakness.  Psychiatric/Behavioral: Negative for confusion, sleep disturbance, dysphoric mood and agitation. The patient is not nervous/anxious.        Objective:   Physical Exam  Constitutional: He is oriented to person, place, and time. He appears well-developed.  HENT:  Mouth/Throat: Oropharynx is clear and moist.  Eyes: Conjunctivae are normal. Pupils are equal, round, and reactive to light.  Neck: Normal range of motion. No JVD present. No thyromegaly present.  Cardiovascular: Normal rate, regular rhythm, normal heart sounds and intact distal pulses.  Exam reveals no gallop and no friction rub.   No murmur heard. Pulmonary/Chest: Effort normal and breath sounds normal. No respiratory distress. He has no wheezes. He has no rales. He exhibits no tenderness.  Abdominal: Soft. Bowel sounds are normal. He exhibits no distension and no mass. There is tenderness (LLQ>RLQ; no mass no rebound). There  is no rebound and no guarding.  Genitourinary: Rectum normal and penis normal. No penile tenderness.       B testes NT  Musculoskeletal: Normal range of motion. He exhibits no edema and no tenderness.  Lymphadenopathy:    He has no cervical adenopathy.  Neurological: He is alert and oriented to person, place, and time. He has normal reflexes. No cranial nerve deficit. He exhibits normal muscle tone. Coordination normal.  Skin: Skin is warm and dry. No rash noted.  Psychiatric: He has a normal mood and affect. His behavior is normal. Judgment and thought content normal.          Assessment & Plan:    It is not clear to me what is going on... R/o stone, appendicitis, colitis etc. Hold diuretic and Simvastatin

## 2011-01-02 NOTE — Assessment & Plan Note (Signed)
Immodium Cipro

## 2011-01-02 NOTE — Patient Instructions (Signed)
Rapaflo 1 a day Imodium 1-2 4 times a day as needed for loose stools

## 2011-01-02 NOTE — Assessment & Plan Note (Signed)
CT abd See meds

## 2011-01-02 NOTE — Assessment & Plan Note (Signed)
Referred pain

## 2011-01-03 ENCOUNTER — Telehealth: Payer: Self-pay | Admitting: Internal Medicine

## 2011-01-03 NOTE — Telephone Encounter (Signed)
Pt informed

## 2011-01-03 NOTE — Telephone Encounter (Signed)
Left mess for patient to call back.  

## 2011-01-03 NOTE — Telephone Encounter (Signed)
Devin Stanton, please, inform patient that all labs and abd CT are normal: no stones and no appendicitis - good news. I would cont to take Cipro and Imodium for diarrhea. May have passed/passing a stone. Take Rapaflo if needed. Thx

## 2011-04-08 ENCOUNTER — Encounter: Payer: Self-pay | Admitting: Internal Medicine

## 2011-04-08 ENCOUNTER — Ambulatory Visit (INDEPENDENT_AMBULATORY_CARE_PROVIDER_SITE_OTHER): Payer: Federal, State, Local not specified - PPO | Admitting: Internal Medicine

## 2011-04-08 ENCOUNTER — Other Ambulatory Visit (INDEPENDENT_AMBULATORY_CARE_PROVIDER_SITE_OTHER): Payer: Federal, State, Local not specified - PPO

## 2011-04-08 DIAGNOSIS — Z0000 Encounter for general adult medical examination without abnormal findings: Secondary | ICD-10-CM

## 2011-04-08 DIAGNOSIS — E785 Hyperlipidemia, unspecified: Secondary | ICD-10-CM

## 2011-04-08 DIAGNOSIS — I10 Essential (primary) hypertension: Secondary | ICD-10-CM

## 2011-04-08 DIAGNOSIS — Z135 Encounter for screening for eye and ear disorders: Secondary | ICD-10-CM

## 2011-04-08 DIAGNOSIS — Z1211 Encounter for screening for malignant neoplasm of colon: Secondary | ICD-10-CM

## 2011-04-08 LAB — COMPREHENSIVE METABOLIC PANEL
ALT: 32 U/L (ref 0–53)
Albumin: 4.7 g/dL (ref 3.5–5.2)
Alkaline Phosphatase: 56 U/L (ref 39–117)
Calcium: 9.5 mg/dL (ref 8.4–10.5)
Creatinine, Ser: 1 mg/dL (ref 0.4–1.5)
GFR: 88.87 mL/min (ref >60.00)
Glucose, Bld: 104 mg/dL — ABNORMAL HIGH (ref 70–99)
Total Protein: 7.9 g/dL (ref 6.0–8.3)

## 2011-04-08 LAB — LIPID PANEL
Total CHOL/HDL Ratio: 2
Triglycerides: 61 mg/dL (ref 0.0–149.0)
VLDL: 12.2 mg/dL (ref 0.0–40.0)

## 2011-04-08 LAB — HEPATIC FUNCTION PANEL
ALT: 32 U/L (ref 0–53)
Alkaline Phosphatase: 56 U/L (ref 39–117)

## 2011-04-08 LAB — TSH: TSH: 1.25 u[IU]/mL (ref 0.35–5.50)

## 2011-04-08 NOTE — Progress Notes (Signed)
Subjective:    Patient ID: Devin Stanton, male    DOB: October 08, 1960, 50 y.o.   MRN: [id number]  HPI Presents for general physical exam. In the interval he has had no major illness, no injury, and no surgery except for vasectomy. He is feeling good and doing well.   Past Medical History  Diagnosis Date  . ED (erectile dysfunction)   . Backache, unspecified   . Unspecified personal history presenting hazards to health   . Unspecified essential hypertension     controlled  . Hyperlipidemia     controlled medically  . Nephrolithiasis     1995   Past Surgical History  Procedure Date  . Tonsillectomy    Family History  Problem Relation Age of Onset  . Parkinsonism Mother   . Macular degeneration Mother   . Leukemia Father   . Coronary artery disease Father   . Heart disease Brother     CABG x 5   History   Social History  . Marital Status: Married    Spouse Name: N/A    Number of Children: 3  . Years of Education: 16   Occupational History  . pilot     Social History Main Topics  . Smoking status: Current Some Day Smoker    Types: Cigars  . Smokeless tobacco: Current User  . Alcohol Use: 1.5 oz/week    3 drink(s) per week     wine or scotch  . Drug Use: No  . Sexually Active: Yes -- Male partner(s)   Other Topics Concern  . Not on file   Social History Narrative   [employer]. 1st -'87 - 6 years/divorced. 2nd marriage- '06-08, 3rd marriage 12/101 son- '91,step son '91, step daughter '93. 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         Review of Systems Constitutional:  Negative for fever, chills, activity change and unexpected weight change.  HEENT:  Negative for hearing loss, ear pain, congestion, neck stiffness and postnasal drip. Negative for sore throat or swallowing problems. Negative for dental complaints.   Eyes: Negative for vision loss or change in visual acuity.    Respiratory: Negative for chest tightness and wheezing. Negative for DOE.   Cardiovascular: Negative for chest pain or palpitations. No decreased exercise tolerance Gastrointestinal: No change in bowel habit. No bloating or gas. No reflux or indigestion Genitourinary: Negative for urgency, frequency, flank pain and difficulty urinating.  Musculoskeletal: Negative for myalgias, back pain, arthralgias and gait problem.  Neurological: Negative for dizziness, tremors, weakness and headaches.  Hematological: Negative for adenopathy.  Psychiatric/Behavioral: Negative for behavioral problems and dysphoric mood.       Objective:   Physical Exam Vital signs reviewed Gen'l: Well nourished well developed white male in no acute distress  HEENT: Head: Normocephalic and atraumatic. Right Ear: External ear normal. EAC/TM nl. Left Ear: External ear normal.  EAC/TM nl. Nose: Nose normal. Mouth/Throat: Oropharynx is clear and moist. Dentition - native, in good repair. No buccal or palatal lesions. Posterior pharynx clear. Eyes: Conjunctivae and sclera clear. EOM intact. Pupils are equal, round, and reactive to light. Right eye exhibits no discharge. Left eye exhibits no discharge. Neck: Normal range of motion. Neck supple. No JVD present. No tracheal deviation present. No thyromegaly present.  Cardiovascular: Normal rate, regular rhythm, no gallop, no friction rub, no murmur heard.      Quiet precordium. 2+ radial and DP pulses .  No carotid bruits Pulmonary/Chest: Effort normal. No respiratory distress or increased WOB, no wheezes, no rales. No chest wall deformity or CVAT. Abdominal: Soft. Bowel sounds are normal in all quadrants. He exhibits no distension, no tenderness, no rebound or guarding, No heptosplenomegaly  Genitourinary:  deferred Musculoskeletal: Normal range of motion. He exhibits no edema and no tenderness.       Small and large joints without redness, synovial thickening or deformity. Full  range of motion preserved about all small, median and large joints.  Lymphadenopathy:    He has no cervical or supraclavicular adenopathy.  Neurological: He is alert and oriented to person, place, and time. CN II-XII intact. DTRs 2+ and symmetrical biceps, radial and patellar tendons. Cerebellar function normal with no tremor, rigidity, normal gait and station.  Skin: Skin is warm and dry. No rash noted. No erythema.  Psychiatric: He has a normal mood and affect. His behavior is normal. Thought content normal.   Lab Results  Component Value Date   WBC 7.5 01/02/2011   HGB 12.9* 01/02/2011   HCT 37.7* 01/02/2011   PLT 233.0 01/02/2011   GLUCOSE 104* 04/08/2011   CHOL 188 04/08/2011   TRIG 61.0 04/08/2011   HDL 77.90 04/08/2011   LDLDIRECT 167.2 04/14/2009   LDLCALC 98 04/08/2011   ALT 32 04/08/2011   ALT 32 04/08/2011   AST 28 04/08/2011   AST 28 04/08/2011   NA 132* 04/08/2011   K 4.4 04/08/2011   CL 95* 04/08/2011   CREATININE 1.0 04/08/2011   BUN 15 04/08/2011   CO2 30 04/08/2011   TSH 1.25 04/08/2011   PSA 0.58 04/17/2010           Assessment & Plan:

## 2011-04-09 DIAGNOSIS — Z0000 Encounter for general adult medical examination without abnormal findings: Secondary | ICD-10-CM | POA: Insufficient documentation

## 2011-04-09 NOTE — Assessment & Plan Note (Signed)
BP Readings from Last 3 Encounters:  04/08/11 132/88  01/02/11 114/84  04/23/10 124/90   Good control of BP on present medications.  Plan- no change in medications.

## 2011-04-09 NOTE — Assessment & Plan Note (Signed)
Lab reveals good control with LDL below goal of 100 or less.  Plan - he will continue his present medication regimen

## 2011-04-09 NOTE — Assessment & Plan Note (Signed)
Interval history is benign. Physical exam is normal. Lab results are in normal limits. He is due, age 50, for colorectal cancer screening. Immunizations: Tetanus June '08; flu shot today.  In summary - a very nice man who is medically stable and doing well. He will continue his healthy lifestyle and is encouraged to find time for regular aerobic exercise. He will return in 1 year or as needed.

## 2011-04-10 ENCOUNTER — Encounter: Payer: Self-pay | Admitting: Internal Medicine

## 2011-04-12 ENCOUNTER — Encounter: Payer: Self-pay | Admitting: Gastroenterology

## 2011-04-24 ENCOUNTER — Encounter: Payer: Self-pay | Admitting: Gastroenterology

## 2011-04-24 ENCOUNTER — Other Ambulatory Visit: Payer: Self-pay | Admitting: Internal Medicine

## 2011-04-24 ENCOUNTER — Ambulatory Visit (AMBULATORY_SURGERY_CENTER): Payer: Federal, State, Local not specified - PPO | Admitting: *Deleted

## 2011-04-24 VITALS — Ht 67.0 in | Wt 165.5 lb

## 2011-04-24 DIAGNOSIS — Z1211 Encounter for screening for malignant neoplasm of colon: Secondary | ICD-10-CM

## 2011-04-24 MED ORDER — PEG-KCL-NACL-NASULF-NA ASC-C 100 G PO SOLR: 1.0000 | Freq: Once | ORAL | Status: DC

## 2011-05-08 ENCOUNTER — Ambulatory Visit (AMBULATORY_SURGERY_CENTER): Payer: Federal, State, Local not specified - PPO | Admitting: Gastroenterology

## 2011-05-08 ENCOUNTER — Encounter: Payer: Self-pay | Admitting: Gastroenterology

## 2011-05-08 VITALS — BP 138/86 | HR 69 | Temp 97.7°F | Resp 17 | Ht 67.0 in | Wt 165.0 lb

## 2011-05-08 DIAGNOSIS — Z1211 Encounter for screening for malignant neoplasm of colon: Secondary | ICD-10-CM

## 2011-05-08 MED ORDER — SODIUM CHLORIDE 0.9 % IV SOLN: 500.0000 mL | INTRAVENOUS | Status: DC

## 2011-05-08 NOTE — Progress Notes (Signed)
Patient did not experience any of the following events: a burn prior to discharge; a fall within the facility; wrong site/side/patient/procedure/implant event; or a hospital transfer or hospital admission upon discharge from the facility. (G8907) Patient did not have preoperative order for IV antibiotic SSI prophylaxis. (G8918)  

## 2011-05-08 NOTE — Patient Instructions (Signed)
FOLLOW THE INSTRUCTIONS ON THE BLUE AND GREEN POST PROCEDURE INSTRUCTIONS SHEETS.  CONTINUE YOUR MEDICATIONS.  NEXT COLONOSCOPY IN 10 YEARS.

## 2011-05-09 ENCOUNTER — Telehealth: Payer: Self-pay | Admitting: *Deleted

## 2011-05-09 NOTE — Telephone Encounter (Signed)
No id on voicemail, no message left 

## 2011-07-17 ENCOUNTER — Encounter: Payer: Self-pay | Admitting: Internal Medicine

## 2011-07-17 ENCOUNTER — Ambulatory Visit (INDEPENDENT_AMBULATORY_CARE_PROVIDER_SITE_OTHER): Payer: Federal, State, Local not specified - PPO | Admitting: Internal Medicine

## 2011-07-17 VITALS — BP 118/84 | HR 91 | Temp 98.3°F | Resp 14 | Wt 163.0 lb

## 2011-07-17 DIAGNOSIS — IMO0002 Reserved for concepts with insufficient information to code with codable children: Secondary | ICD-10-CM

## 2011-07-17 DIAGNOSIS — N509 Disorder of male genital organs, unspecified: Secondary | ICD-10-CM

## 2011-07-17 MED ORDER — BENAZEPRIL-HYDROCHLOROTHIAZIDE 20-25 MG PO TABS: ORAL_TABLET | ORAL | Status: DC

## 2011-07-17 MED ORDER — SIMVASTATIN 20 MG PO TABS: ORAL_TABLET | ORAL | Status: DC

## 2011-07-17 NOTE — Progress Notes (Signed)
  Subjective:    Patient ID: Devin Stanton, male    DOB: 1960/07/09, 51 y.o.   MRN: [id number]  HPI Devin Stanton presents for a 3 month h/o pain in the left scrotum with intercourse. He has had no blood in his ejaculate, no low abdomen or low back pain, no urinary track symptoms, no pain at other times. He has had no trauma, no weight loss, no night sweats, no adenopathy. He has had vasectomy  Past Medical History  Diagnosis Date  . ED (erectile dysfunction)   . Backache, unspecified   . Unspecified personal history presenting hazards to health   . Unspecified essential hypertension     controlled  . Hyperlipidemia     controlled medically  . Nephrolithiasis     1995   Past Surgical History  Procedure Date  . Tonsillectomy   . Vasectomy    Family History  Problem Relation Age of Onset  . Parkinsonism Mother   . Macular degeneration Mother   . Leukemia Father   . Coronary artery disease Father   . Heart disease Brother     CABG x 5  . Colon cancer Neg Hx   . Esophageal cancer Neg Hx   . Stomach cancer Neg Hx    History   Social History  . Marital Status: Married    Spouse Name: N/A    Number of Children: 3  . Years of Education: 16   Occupational History  . pilot     Social History Main Topics  . Smoking status: Never Smoker   . Smokeless tobacco: Current User  . Alcohol Use: 1.5 oz/week    3 drink(s) per week     wine or scotch  . Drug Use: No  . Sexually Active: Yes -- Male partner(s)   Other Topics Concern  . Not on file   Social History Narrative   [place]. 1st -'87 - 6 years/divorced. 2nd marriage- '06-08, 3rd marriage 12/101 son- '91,step son '91, step daughter '93. 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       Review of Systems System review is negative for any constitutional, cardiac, pulmonary, GI or neuro symptoms or complaints other than as described in the HPI.     Objective:   Physical Exam Filed Vitals:   07/17/11 1602  BP: 118/84  Pulse: 91  Temp: 98.3 F (36.8 C)  Resp: 14   Gen'l - WNWD white man in no distress Pulm - normal Cor - RRR GU - normal penis, bilaterally descended mildly atrophic testicles, easily palpated vesectomy clip, no tenderness at the epididymis, tender at the spermatic cord above what feels like a vasectomy clip. No varicocele or hydrocele.       Assessment & Plan:  Testicular/spermatic cord tenderness - may be a symptomatic spermatocele that is small, neoplasm vs other.  Plan - U/s scrotum            May need urology consult.

## 2011-07-19 ENCOUNTER — Ambulatory Visit
Admission: RE | Admit: 2011-07-19 | Discharge: 2011-07-19 | Disposition: A | Discharge status: Home / Self Care | Payer: Federal, State, Local not specified - PPO | Source: Ambulatory Visit | Attending: Internal Medicine | Admitting: Internal Medicine

## 2011-07-19 ENCOUNTER — Other Ambulatory Visit: Payer: Self-pay | Admitting: Internal Medicine

## 2011-07-19 DIAGNOSIS — IMO0002 Reserved for concepts with insufficient information to code with codable children: Secondary | ICD-10-CM

## 2011-07-22 ENCOUNTER — Telehealth: Payer: Self-pay

## 2011-07-22 DIAGNOSIS — N50811 Right testicular pain: Secondary | ICD-10-CM

## 2011-07-22 NOTE — Telephone Encounter (Signed)
Pt called requesting test results from 02/08.

## 2011-07-22 NOTE — Telephone Encounter (Signed)
Called pt - U/S normal. Will refer to Dr. Isabel Caprice

## 2011-09-04 ENCOUNTER — Telehealth: Payer: Self-pay

## 2011-09-04 DIAGNOSIS — E785 Hyperlipidemia, unspecified: Secondary | ICD-10-CM

## 2011-09-04 MED ORDER — ROSUVASTATIN CALCIUM 10 MG PO TABS: 10.0000 mg | ORAL_TABLET | Freq: Every day | ORAL | Status: DC

## 2011-09-04 NOTE — Telephone Encounter (Signed)
Pt called stating that although he has ben taking Simvastatin for several years he is now noticing muscle pain and cramps. Pt is requesting Rx be switched to Crestor, please advise.

## 2011-09-04 NOTE — Telephone Encounter (Signed)
Unusual to have late ons-set "Statin" related symptoms but OK.  Crestor 10 mg qd, #30, refill x 3. Ordered, med list updated Lab in 3 weeks - entered

## 2011-09-05 NOTE — Telephone Encounter (Signed)
Patient informed. 

## 2011-10-08 ENCOUNTER — Other Ambulatory Visit (INDEPENDENT_AMBULATORY_CARE_PROVIDER_SITE_OTHER): Payer: Federal, State, Local not specified - PPO

## 2011-10-08 DIAGNOSIS — E785 Hyperlipidemia, unspecified: Secondary | ICD-10-CM

## 2011-10-08 LAB — LIPID PANEL
Cholesterol: 161 mg/dL (ref 0–200)
LDL Cholesterol: 82 mg/dL (ref 0–99)
Triglycerides: 66 mg/dL (ref 0.0–149.0)
VLDL: 13.2 mg/dL (ref 0.0–40.0)

## 2011-10-08 LAB — HEPATIC FUNCTION PANEL
ALT: 32 U/L (ref 0–53)
Albumin: 3.9 g/dL (ref 3.5–5.2)
Total Protein: 7 g/dL (ref 6.0–8.3)

## 2011-10-11 ENCOUNTER — Encounter: Payer: Self-pay | Admitting: Internal Medicine

## 2012-01-13 ENCOUNTER — Other Ambulatory Visit: Payer: Self-pay | Admitting: Internal Medicine

## 2012-03-02 ENCOUNTER — Ambulatory Visit (INDEPENDENT_AMBULATORY_CARE_PROVIDER_SITE_OTHER): Payer: Federal, State, Local not specified - PPO | Admitting: Internal Medicine

## 2012-03-02 ENCOUNTER — Encounter: Payer: Self-pay | Admitting: Internal Medicine

## 2012-03-02 VITALS — BP 128/80 | HR 82 | Temp 97.8°F | Resp 16 | Wt 166.0 lb

## 2012-03-02 DIAGNOSIS — I10 Essential (primary) hypertension: Secondary | ICD-10-CM

## 2012-03-02 DIAGNOSIS — Z125 Encounter for screening for malignant neoplasm of prostate: Secondary | ICD-10-CM

## 2012-03-02 DIAGNOSIS — Z23 Encounter for immunization: Secondary | ICD-10-CM

## 2012-03-02 DIAGNOSIS — E785 Hyperlipidemia, unspecified: Secondary | ICD-10-CM

## 2012-03-02 DIAGNOSIS — Z0000 Encounter for general adult medical examination without abnormal findings: Secondary | ICD-10-CM

## 2012-03-02 NOTE — Assessment & Plan Note (Signed)
Interval hx is negative. Physical exam in normal. Lab results are in normal limits. He is current with colorectal cancer screening. Discussed pros and cons of prostate cancer screening (USPHCTF recommendations reviewed and ACU April '13 recommendations) and he defers evaluation at this time with last PSA in '11 of 0.58. Immunizations are up to date.  In summary - a very nice man who is medically stable and doing well. He is due for a repeat physical exam in 2 years, labs in 1 year.

## 2012-03-02 NOTE — Assessment & Plan Note (Signed)
Great response to Crestor with LDL better than goal of 100 or less. LFTs normal  Plan- continue present medications.

## 2012-03-02 NOTE — Patient Instructions (Addendum)
Good exam. Report to follow. ACA (ObamaCare) - better than doing nothing.

## 2012-03-02 NOTE — Progress Notes (Signed)
Subjective:    Patient ID: Devin Stanton, male    DOB: 1960-07-27, 51 y.o.   MRN: [id number]  HPI Mr. Blosser presents for routine medical exam. In the interval since his last visit he has been healthy with no major illness, surgery or injury. Feeling good, doing good.  Past Medical History  Diagnosis Date  . ED (erectile dysfunction)   . Backache, unspecified   . Unspecified personal history presenting hazards to health   . Unspecified essential hypertension     controlled  . Hyperlipidemia     controlled medically  . Nephrolithiasis     1995   Past Surgical History  Procedure Date  . Tonsillectomy   . Vasectomy    Family History  Problem Relation Age of Onset  . Parkinsonism Mother   . Macular degeneration Mother   . Leukemia Father   . Coronary artery disease Father   . Heart disease Brother     CABG x 5  . Colon cancer Neg Hx   . Esophageal cancer Neg Hx   . Stomach cancer Neg Hx    History   Social History  . Marital Status: Married    Spouse Name: N/A    Number of Children: 3  . Years of Education: 16   Occupational History  . pilot     Social History Main Topics  . Smoking status: Never Smoker   . Smokeless tobacco: Current User  . Alcohol Use: 1.5 oz/week    3 drink(s) per week     wine or scotch  . Drug Use: No  . Sexually Active: Yes -- Male partner(s)   Other Topics Concern  . Not on file   Social History Narrative   [place]. 1st -'87 - 6 years/divorced. 2nd marriage- '06-08, 3rd marriage 12/101 son- '91,step son '91, step daughter '93. 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    Current Outpatient Prescriptions on File Prior to Visit  Medication Sig Dispense Refill  . benazepril-hydrochlorthiazide (LOTENSIN HCT) 20-25 MG per tablet TAKE 1 TABLET BY MOUTH ONCE DAILY  90 tablet  1  . CRESTOR 10 MG tablet TAKE 1 TABLET BY MOUTH DAILY  30 tablet  6      Review  of Systems Constitutional:  Negative for fever, chills, activity change and unexpected weight change.  HEENT:  Negative for hearing loss, ear pain, congestion, neck stiffness and postnasal drip. Negative for sore throat or swallowing problems. Negative for dental complaints.   Eyes: Negative for vision loss or change in visual acuity.  Respiratory: Negative for chest tightness and wheezing. Negative for DOE.   Cardiovascular: Negative for chest pain or palpitations. No decreased exercise tolerance Gastrointestinal: No change in bowel habit. No bloating or gas. No reflux or indigestion Genitourinary: Negative for urgency, frequency, flank pain and difficulty urinating.  Musculoskeletal: Negative for myalgias, back pain, arthralgias and gait problem.  Neurological: Negative for dizziness, tremors, weakness and headaches.  Hematological: Negative for adenopathy.  Psychiatric/Behavioral: Negative for behavioral problems and dysphoric mood.       Objective:   Physical Exam Filed Vitals:   03/02/12 1404  BP: 128/80  Pulse: 82  Temp: 97.8 F (36.6 C)  Resp: 16   Wt Readings from Last 3 Encounters:  03/02/12 166 lb (75.297 kg)  07/17/11 163 lb (73.936 kg)  05/08/11 165 lb (74.844 kg)   Gen'l: Well nourished well developed white male in no  acute distress  HEENT: Head: Normocephalic and atraumatic. Right Ear: External ear normal. EAC/TM nl. Left Ear: External ear normal.  EAC/TM nl. Nose: Nose normal. Mouth/Throat: Oropharynx is clear and moist. Dentition - native, in good repair. No buccal or palatal lesions. Posterior pharynx clear. Eyes: Conjunctivae and sclera clear. EOM intact. Pupils are equal, round, and reactive to light. Right eye exhibits no discharge. Left eye exhibits no discharge. Neck: Normal range of motion. Neck supple. No JVD present. No tracheal deviation present. No thyromegaly present.  Cardiovascular: Normal rate, regular rhythm, no gallop, no friction rub, no murmur  heard.      Quiet precordium. 2+ radial and DP pulses . No carotid bruits Pulmonary/Chest: Effort normal. No respiratory distress or increased WOB, no wheezes, no rales. No chest wall deformity or CVAT. Abdomen: Soft. Bowel sounds are normal in all quadrants. He exhibits no distension, no tenderness, no rebound or guarding, No heptosplenomegaly  Genitourinary:   Musculoskeletal: Normal range of motion. He exhibits no edema and no tenderness.       Small and large joints without redness, synovial thickening or deformity. Full range of motion preserved about all small, median and large joints.  Lymphadenopathy:    He has no cervical or supraclavicular adenopathy.  Neurological: He is alert and oriented to person, place, and time. CN II-XII intact. DTRs 2+ and symmetrical biceps, radial and patellar tendons. Cerebellar function normal with no tremor, rigidity, normal gait and station.  Skin: Skin is warm and dry. No rash noted. No erythema.  Psychiatric: He has a normal mood and affect. His behavior is normal. Thought content normal.   Lab Results  Component Value Date   WBC 7.5 01/02/2011   HGB 12.9* 01/02/2011   HCT 37.7* 01/02/2011   PLT 233.0 01/02/2011   GLUCOSE 104* 04/08/2011   CHOL 161 10/08/2011   TRIG 66.0 10/08/2011   HDL 65.80 10/08/2011   LDLDIRECT 167.2 04/14/2009   LDLCALC 82 10/08/2011   ALT 32 10/08/2011   AST 23 10/08/2011   NA 132* 04/08/2011   K 4.4 04/08/2011   CL 95* 04/08/2011   CREATININE 1.0 04/08/2011   BUN 15 04/08/2011   CO2 30 04/08/2011   TSH 1.25 04/08/2011   PSA 0.58 04/17/2010         Assessment & Plan:

## 2012-03-02 NOTE — Assessment & Plan Note (Signed)
BP Readings from Last 3 Encounters:  03/02/12 128/80  07/17/11 118/84  05/08/11 138/86   Very good control. Chemistries are normal  Plan- continue present medicatons

## 2012-03-05 ENCOUNTER — Telehealth: Payer: Self-pay | Admitting: Internal Medicine

## 2012-03-05 NOTE — Telephone Encounter (Signed)
New Problem:    Patient's wife called in wanting to know if Dr. Patty Sermons would be willing to see her husband.  Please call back, was aware that you were out of the office when this message was taken.

## 2012-03-06 NOTE — Telephone Encounter (Signed)
Patient has strong family history and his family sees  Dr. Patty Sermons.  Scheduled an appointment for patient to be seen in November.  Did offer appointment next week but wife said he had to work. Per wife patient just needs cardiac workup

## 2012-03-11 ENCOUNTER — Other Ambulatory Visit (INDEPENDENT_AMBULATORY_CARE_PROVIDER_SITE_OTHER): Payer: Federal, State, Local not specified - PPO

## 2012-03-11 DIAGNOSIS — I10 Essential (primary) hypertension: Secondary | ICD-10-CM

## 2012-03-11 DIAGNOSIS — Z125 Encounter for screening for malignant neoplasm of prostate: Secondary | ICD-10-CM

## 2012-03-11 DIAGNOSIS — E785 Hyperlipidemia, unspecified: Secondary | ICD-10-CM

## 2012-03-11 LAB — COMPREHENSIVE METABOLIC PANEL
CO2: 28 mEq/L (ref 19–32)
Calcium: 9.8 mg/dL (ref 8.4–10.5)
Chloride: 95 mEq/L — ABNORMAL LOW (ref 96–112)
Creatinine, Ser: 1.1 mg/dL (ref 0.4–1.5)
GFR: 74.77 mL/min (ref >60.00)
Glucose, Bld: 104 mg/dL — ABNORMAL HIGH (ref 70–99)
Sodium: 132 mEq/L — ABNORMAL LOW (ref 135–145)
Total Bilirubin: 1.1 mg/dL (ref 0.3–1.2)
Total Protein: 7.5 g/dL (ref 6.0–8.3)

## 2012-03-11 LAB — PSA: PSA: 0.47 ng/mL (ref 0.10–4.00)

## 2012-03-11 LAB — HEPATIC FUNCTION PANEL
AST: 27 U/L (ref 0–37)
Albumin: 4.5 g/dL (ref 3.5–5.2)
Total Bilirubin: 1.1 mg/dL (ref 0.3–1.2)

## 2012-03-11 LAB — LIPID PANEL
HDL: 75.4 mg/dL (ref >39.00)
Triglycerides: 72 mg/dL (ref 0.0–149.0)

## 2012-04-20 ENCOUNTER — Other Ambulatory Visit: Payer: Self-pay | Admitting: Internal Medicine

## 2012-04-23 ENCOUNTER — Encounter: Payer: Self-pay | Admitting: Cardiology

## 2012-04-23 ENCOUNTER — Ambulatory Visit (HOSPITAL_COMMUNITY): Payer: Federal, State, Local not specified - PPO | Attending: Cardiovascular Disease

## 2012-04-23 ENCOUNTER — Ambulatory Visit (INDEPENDENT_AMBULATORY_CARE_PROVIDER_SITE_OTHER): Payer: Federal, State, Local not specified - PPO | Admitting: Cardiology

## 2012-04-23 VITALS — BP 130/88 | HR 70 | Resp 18 | Ht 67.0 in | Wt 167.4 lb

## 2012-04-23 DIAGNOSIS — R0989 Other specified symptoms and signs involving the circulatory and respiratory systems: Secondary | ICD-10-CM | POA: Insufficient documentation

## 2012-04-23 DIAGNOSIS — I369 Nonrheumatic tricuspid valve disorder, unspecified: Secondary | ICD-10-CM | POA: Insufficient documentation

## 2012-04-23 DIAGNOSIS — Z8249 Family history of ischemic heart disease and other diseases of the circulatory system: Secondary | ICD-10-CM

## 2012-04-23 DIAGNOSIS — E7800 Pure hypercholesterolemia, unspecified: Secondary | ICD-10-CM

## 2012-04-23 DIAGNOSIS — I10 Essential (primary) hypertension: Secondary | ICD-10-CM | POA: Insufficient documentation

## 2012-04-23 DIAGNOSIS — I119 Hypertensive heart disease without heart failure: Secondary | ICD-10-CM

## 2012-04-23 DIAGNOSIS — I080 Rheumatic disorders of both mitral and aortic valves: Secondary | ICD-10-CM | POA: Insufficient documentation

## 2012-04-23 DIAGNOSIS — I379 Nonrheumatic pulmonary valve disorder, unspecified: Secondary | ICD-10-CM | POA: Insufficient documentation

## 2012-04-23 DIAGNOSIS — R0609 Other forms of dyspnea: Secondary | ICD-10-CM | POA: Insufficient documentation

## 2012-04-23 NOTE — Progress Notes (Signed)
Devin Stanton is an 51 y.o. male.   Chief Complaint: Dyspnea and a strong family history of cardiovascular disease HPI: This 51 year old gentleman is seen by me for the first time today.  He is self-referred.  Dr. Debby Bud is his primary care provider.  I am familiar with his family because I have helped to look after 2 of his brothers, his mother, and his deceased father.  There is a strong history of premature cardiovascular history in the family.  One of his brothers recently had a emergency repair of an aortic dissection and another brother has coronary artery disease.  His father died of ischemic heart disease.  His mother has hypercholesterolemia. The patient has been very concerned about his own health particularly since his brother had his aortic dissection.  The patient has a history of essential hypertension and a history of hypercholesterolemia.  He does not have any history of known ischemic heart disease.  He had a nuclear stress test in 2004 which showed no ischemia and his ejection fraction was 50%.  That study was done at [facility] hospital.    The patient has had an abnormal EKG with anterior wall T wave inversion in the V1 through V3 leads.  His electrocardiogram has also shown possible left atrial enlargement.  The patient has been on medication for high blood pressure and his blood pressure today is slightly high which he attributes to having just finished a prednisone Dosepak for treatment of a skin rash.  The patient has noted exertional dyspnea.  He has not been experiencing any chest pain.  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  He works 6 days and and 6 days off.  On his off days he plays golf but does not get any regular aerobic exercise.  Past Medical History  Diagnosis Date  . ED (erectile dysfunction)   . Backache, unspecified   . Unspecified personal history presenting hazards to health   . Unspecified essential hypertension     controlled  .  Hyperlipidemia     controlled medically  . Nephrolithiasis     1995    Past Surgical History  Procedure Date  . Tonsillectomy   . Vasectomy     Family History  Problem Relation Age of Onset  . Parkinsonism Mother   . Macular degeneration Mother   . Leukemia Father   . Coronary artery disease Father   . Heart disease Brother     CABG x 5  . Colon cancer Neg Hx   . Esophageal cancer Neg Hx   . Stomach cancer Neg Hx    Social History:  reports that he has never smoked. He uses smokeless tobacco. He reports that he drinks about 1.5 ounces of alcohol per week. He reports that he does not use illicit drugs.  Allergies: No Known Allergies  Medications: Benazepril 20/25 one daily                      Crestor 10 mg daily    Review of systems reveals no history of peptic ulcer disease or GI bleeding.  No cough or sputum production.  He has had a recent persistent skin rash which has responded to a recent prednisone Dosepak.  All other systems negative except as noted above  Blood pressure 130/88, pulse 70, resp. rate 18, height 5\' 7"  (1.702 m), weight 167 lb 6.4 oz (75.932 kg), SpO2 98.00%. The patient appears to be in  no distress.  Repeat blood pressure by me sitting using the right arm was 132/92 Head and neck exam reveals that the pupils are equal and reactive.  The extraocular movements are full.  There is no scleral icterus.  Mouth and pharynx are benign.  No lymphadenopathy.  No carotid bruits.  The jugular venous pressure is normal.  Thyroid is not enlarged or tender.  Chest is clear to percussion and auscultation.  No rales or rhonchi.  Expansion of the chest is symmetrical.  Heart reveals no abnormal lift or heave.  First and second heart sounds are normal.  There is no murmur gallop rub or click.  The abdomen is soft and nontender.  Bowel sounds are normoactive.  There is no hepatosplenomegaly or mass.  There are no abdominal bruits.  Extremities reveal no phlebitis or  edema.  Pedal pulses are good.  There is no cyanosis or clubbing.  Neurologic exam is normal strength and no lateralizing weakness.  No sensory deficits.  Integument reveals no rash.  EKG shows normal sinus rhythm, possible left atrial enlargement, and anterior T wave inversion unchanged since 04/08/11  Assessment/Plan 1.  Essential hypertension 2.  hypercholesterolemia on Crestor with most recent LDL 101 3. Dyspnea 4.. abnormal EKG 5. strong family history of ischemic heart disease 6.. tobacco abuse--smokeless tobacco  Plan: I have encouraged him to start getting more regular moderate exercise.  We will have him return for an echocardiogram and for a treadmill Myoview.  He will watch his diet more carefully in regards to dietary salt intake and continue low-cholesterol diet.  No change in his medications at this point other than to and a baby aspirin daily.  After receiving the results of the stress test we will plan to get him a more formalized exercise prescription.  Fortunately a new health club and fitness center is opening next month close to his home.  Lorice Lafave 04/23/2012, 52:15 PM

## 2012-04-23 NOTE — Patient Instructions (Addendum)
Your physician has requested that you have an echocardiogram. Echocardiography is a painless test that uses sound waves to create images of your heart. It provides your doctor with information about the size and shape of your heart and how well your heart's chambers and valves are working. This procedure takes approximately one hour. There are no restrictions for this procedure.  Your physician has requested that you have en exercise stress myoview. For further information please visit [email or web address] Please follow instruction sheet, as given.   ADD ASPIRIN 81 MG (BABY ASPIRIN) DAILY  Work on low cholesterol and sodium (salt) diet

## 2012-04-23 NOTE — Progress Notes (Signed)
Echocardiogram performed.  

## 2012-04-24 ENCOUNTER — Other Ambulatory Visit (HOSPITAL_COMMUNITY): Payer: Federal, State, Local not specified - PPO

## 2012-04-28 ENCOUNTER — Ambulatory Visit (HOSPITAL_COMMUNITY): Payer: Federal, State, Local not specified - PPO | Attending: Cardiology | Admitting: Radiology

## 2012-04-28 ENCOUNTER — Telehealth: Payer: Self-pay | Admitting: *Deleted

## 2012-04-28 VITALS — Ht 67.0 in | Wt 160.0 lb

## 2012-04-28 DIAGNOSIS — R0602 Shortness of breath: Secondary | ICD-10-CM

## 2012-04-28 DIAGNOSIS — Z8249 Family history of ischemic heart disease and other diseases of the circulatory system: Secondary | ICD-10-CM

## 2012-04-28 DIAGNOSIS — R079 Chest pain, unspecified: Secondary | ICD-10-CM

## 2012-04-28 DIAGNOSIS — I119 Hypertensive heart disease without heart failure: Secondary | ICD-10-CM | POA: Insufficient documentation

## 2012-04-28 DIAGNOSIS — E7800 Pure hypercholesterolemia, unspecified: Secondary | ICD-10-CM

## 2012-04-28 MED ORDER — TECHNETIUM TC 99M SESTAMIBI GENERIC - CARDIOLITE
10.0000 | Freq: Once | INTRAVENOUS | Status: AC | PRN
  Administered 2012-04-28: 10 via INTRAVENOUS

## 2012-04-28 MED ORDER — TECHNETIUM TC 99M SESTAMIBI GENERIC - CARDIOLITE
30.0000 | Freq: Once | INTRAVENOUS | Status: AC | PRN
  Administered 2012-04-28: 30 via INTRAVENOUS

## 2012-04-28 NOTE — Telephone Encounter (Signed)
Advised of echo  

## 2012-04-28 NOTE — Progress Notes (Signed)
[facility] SITE 3 NUCLEAR MED [id number] [place] [address] [phone]  Cardiology Nuclear Med Study  Devin Stanton is a 51 y.o. male     MRN : [id number]     DOB: 1961-05-02  Procedure Date: 04/28/2012  Nuclear Med Background Indication for Stress Test:  Evaluation for Ischemia History: No prior known history of CAD, Abnormal EKG, '04 MPS: (-) ischemia EF: 50%, and 04-23-12 Echo: EF=60-65%, mild LVH Cardiac Risk Factors: Strong, Premature Family History - CAD, Hypertension, Lipids and Smoker  Symptoms: Chronic Chest Pain (dull-ache) with/without exertion intermittent x years, worse with certain movements at present 1-2/10,  DOE, Fatigue and Fatigue with Exertion   Nuclear Pre-Procedure Caffeine/Decaff Intake:  None NPO After: 8:30pm   Lungs:  clear O2 Sat: 97% on room air. IV 0.9% NS with Angio Cath:  20g  IV Site: R Antecubital  IV Started by:  Milana Na, EMT-P  Chest Size (in):  42/44 Cup Size: n/a  Height: 5\' 7"  (1.702 m)  Weight:  160 lb (72.576 kg)  BMI:  Body mass index is 25.06 kg/(m^2). Tech Comments:  Took Lotensin Hct this am    Nuclear Med Study 1 or 2 day study: 1 day  Stress Test Type:  Stress  Reading MD: Marca Ancona, MD  Order Authorizing Provider:  Cassell Clement, MD  Resting Radionuclide: Technetium 8m Sestamibi  Resting Radionuclide Dose: 11.0 mCi   Stress Radionuclide:  Technetium 15m Sestamibi  Stress Radionuclide Dose: 33.0 mCi           Stress Protocol Rest HR: 76 Stress HR: 166  Rest BP: 125/94 Stress BP: 178/79  Exercise Time (min): 9:30 METS: 10.9   Predicted Max HR: 169 bpm % Max HR: 98.22 bpm Rate Pressure Product: 41324   Dose of Adenosine (mg):  n/a Dose of Lexiscan: n/a mg  Dose of Atropine (mg): n/a Dose of Dobutamine: n/a mcg/kg/min (at max HR)  Stress Test Technologist: Irean Hong, RN  Nuclear Technologist:  Domenic Polite, CNMT     Rest Procedure:  Myocardial perfusion  imaging was performed at rest 45 minutes following the intravenous administration of Technetium 80m Sestamibi. Rest ECG: NSR with anterior T wave inversion  Stress Procedure:  The patient performed treadmill exercise using a Bruce  Protocol for 9 minutes, and 30 seconds, RPE=16. The patient stopped due to bilateral entire leg fatigue, DOE, and baseline chest pain improved with exercise to 1/10.There were nonspecific  ST-T wave changes. There was a rare PVC. Technetium 64m Sestamibi was injected at peak exercise and myocardial perfusion imaging was performed after a brief delay. Stress ECG: No significant change from baseline ECG  QPS Raw Data Images:  Normal; no motion artifact; normal heart/lung ratio. Stress Images:  Normal homogeneous uptake in all areas of the myocardium. Rest Images:  Normal homogeneous uptake in all areas of the myocardium. Subtraction (SDS):  There is no evidence of scar or ischemia. Transient Ischemic Dilatation (Normal <1.22):  0.85 Lung/Heart Ratio (Normal <0.45):  0.25  Quantitative Gated Spect Images QGS EDV:  88 ml QGS ESV:  38 ml  Impression Exercise Capacity:  Good exercise capacity. BP Response:  Normal blood pressure response. Clinical Symptoms:  Dyspnea.  ECG Impression:  No ischemic changes.  Comparison with Prior Nuclear Study: No significant change from previous study  Overall Impression:  Normal stress nuclear study.  LV Ejection Fraction: 57%.  LV Wall Motion:  NL LV Function; NL Wall Motion  [employer]  04/28/2012        

## 2012-04-28 NOTE — Telephone Encounter (Signed)
Message copied by Burnell Blanks on Tue Apr 28, 2012  2:23 PM ------      Message from: Cassell Clement      Created: Thu Apr 23, 2012  7:48 PM       Please report.  LV systolic function is normal. Mild LVH. The aortic root is normal. The left atrium is normal.

## 2012-04-29 ENCOUNTER — Telehealth: Payer: Self-pay | Admitting: Cardiology

## 2012-04-29 NOTE — Telephone Encounter (Signed)
Pt called for myoview results. I have given him the preliminary results as it has been read by cardiology. Pt understands Dr. Patty Sermons will review and Juliette Alcide will return call for final results. Pt agrees with plan. Mylo Red RN

## 2012-04-29 NOTE — Telephone Encounter (Signed)
plz return call to pt [phone] regarding test results

## 2012-04-30 NOTE — Telephone Encounter (Signed)
Advised patient  Notes Recorded by Cassell Clement, MD on 04/29/2012 at 3:16 PM Please report. The treadmill Myoview stress test was normal. The left ventricular function is normal. There is no evidence of ischemia. It is okay for him to embark on a program of regular aerobic exercise at his gym.

## 2012-05-01 ENCOUNTER — Telehealth: Payer: Self-pay | Admitting: Cardiology

## 2012-05-01 NOTE — Telephone Encounter (Signed)
plz return call pt wife [phone] regarding test results

## 2012-05-01 NOTE — Telephone Encounter (Signed)
Spoke with wife and we will see patient in 4 months

## 2012-07-27 ENCOUNTER — Other Ambulatory Visit: Payer: Self-pay | Admitting: Internal Medicine

## 2012-09-02 ENCOUNTER — Ambulatory Visit: Payer: Federal, State, Local not specified - PPO | Admitting: Cardiology

## 2012-09-07 ENCOUNTER — Encounter: Payer: Self-pay | Admitting: Cardiology

## 2012-09-07 ENCOUNTER — Ambulatory Visit (INDEPENDENT_AMBULATORY_CARE_PROVIDER_SITE_OTHER): Payer: Federal, State, Local not specified - PPO | Admitting: Cardiology

## 2012-09-07 VITALS — BP 120/78 | HR 80 | Ht 67.0 in | Wt 168.2 lb

## 2012-09-07 DIAGNOSIS — I119 Hypertensive heart disease without heart failure: Secondary | ICD-10-CM

## 2012-09-07 DIAGNOSIS — I10 Essential (primary) hypertension: Secondary | ICD-10-CM

## 2012-09-07 DIAGNOSIS — E7800 Pure hypercholesterolemia, unspecified: Secondary | ICD-10-CM

## 2012-09-07 DIAGNOSIS — E785 Hyperlipidemia, unspecified: Secondary | ICD-10-CM

## 2012-09-07 NOTE — Patient Instructions (Addendum)
Your physician recommends that you continue on your current medications as directed. Please refer to the Current Medication list given to you today.  Your physician wants you to follow-up in: 1 year ov/ekg You will receive a reminder letter in the mail two months in advance. If you don't receive a letter, please call our office to schedule the follow-up appointment.  

## 2012-09-07 NOTE — Progress Notes (Signed)
Devin Stanton Date of Birth:  [birth year] [facility] [address] [place], [place]  [address] [phone]  Fax   [phone]  HPI: This pleasant 52 year old gentleman is seen back for a scheduled followup office visit. There is a strong history of premature cardiovascular history in the family. One of his brothers recently had a emergency repair of an aortic dissection and another brother has coronary artery disease. His father died of ischemic heart disease. His mother has hypercholesterolemia.  Because of his family history we had seen him 6 months ago.  He was very concerned about getting evaluated for possible ischemic heart disease.  On 04/23/12 he had an echocardiogram showing normal left her car function with an ejection fraction of 60-65% and he had a normal aortic root dimension.  On 04/28/12 he underwent treadmill Myoview stress test which showed no ischemia and his ejection fraction was 57%.  Since last visit she's been doing well.  He's been exercising regularly at the health club near where he lives.  He gets his heart rate up into the 140s without difficulty on the elliptical machine.  He denies any chest discomfort.  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   Current Outpatient Prescriptions  Medication Sig Dispense Refill  . aspirin 81 MG tablet Take 81 mg by mouth daily.      . benazepril-hydrochlorthiazide (LOTENSIN HCT) 20-25 MG per tablet TAKE 1 TABLET BY MOUTH ONCE DAILY  30 tablet  5  . CRESTOR 10 MG tablet TAKE 1 TABLET BY MOUTH DAILY  30 tablet  5   No current facility-administered medications for this visit.    No Known Allergies  Patient Active Problem List  Diagnosis  . HYPERLIPIDEMIA, MILD  . ERECTILE DYSFUNCTION, MILD  . HYPERTENSION  . BACK PAIN  . CHICKENPOX, HX OF  . TONSILLECTOMY, HX OF  . Testicular pain, right  . Routine health maintenance  . Need for prophylactic vaccination and  inoculation against influenza    History  Smoking status  . Never Smoker   Smokeless tobacco  . Current User    History  Alcohol Use  . 1.5 oz/week  . 3 drink(s) per week    Comment: wine or scotch    Family History  Problem Relation Age of Onset  . Parkinsonism Mother   . Macular degeneration Mother   . Leukemia Father   . Coronary artery disease Father   . Heart disease Brother     CABG x 5  . Colon cancer Neg Hx   . Esophageal cancer Neg Hx   . Stomach cancer Neg Hx     Review of Systems: The patient denies any heat or cold intolerance.  No weight gain or weight loss.  The patient denies headaches or blurry vision.  There is no cough or sputum production.  The patient denies dizziness.  There is no hematuria or hematochezia.  The patient denies any muscle aches or arthritis.  The patient denies any rash.  The patient denies frequent falling or instability.  There is no history of depression or anxiety.  All other systems were reviewed and are negative.   Physical Exam: Filed Vitals:   09/07/12 1508  BP: 120/78  Pulse: 80   the general appearance reveals a well-developed well-nourished gentleman in no distress.The head and neck exam reveals pupils equal and reactive.  Extraocular movements are full.  There is no scleral icterus.  The mouth  and pharynx are normal.  The neck is supple.  The carotids reveal no bruits.  The jugular venous pressure is normal.  The  thyroid is not enlarged.  There is no lymphadenopathy.  The chest is clear to percussion and auscultation.  There are no rales or rhonchi.  Expansion of the chest is symmetrical.  The precordium is quiet.  The first heart sound is normal.  The second heart sound is physiologically split.  There is no murmur gallop rub or click.  There is no abnormal lift or heave.  The abdomen is soft and nontender.  The bowel sounds are normal.  The liver and spleen are not enlarged.  There are no abdominal masses.  There are no  abdominal bruits.  Extremities reveal good pedal pulses.  There is no phlebitis or edema.  There is no cyanosis or clubbing.  Strength is normal and symmetrical in all extremities.  There is no lateralizing weakness.  There are no sensory deficits.  The skin is warm and dry.  There is no rash.      Assessment / Plan: Continue on same medication.  Recheck in one year for office visit and EKG, or sooner when necessary

## 2012-09-07 NOTE — Assessment & Plan Note (Signed)
His lipids have been checked recently by Dr. Debby Bud and are satisfactory.

## 2012-09-07 NOTE — Assessment & Plan Note (Signed)
Blood pressure has been stable on current therapy. 

## 2012-10-20 ENCOUNTER — Other Ambulatory Visit: Payer: Self-pay | Admitting: Internal Medicine

## 2013-02-18 ENCOUNTER — Other Ambulatory Visit: Payer: Self-pay | Admitting: Internal Medicine

## 2013-04-15 ENCOUNTER — Other Ambulatory Visit: Payer: Self-pay

## 2013-04-23 ENCOUNTER — Other Ambulatory Visit: Payer: Self-pay | Admitting: Internal Medicine

## 2013-05-13 ENCOUNTER — Encounter: Payer: Self-pay | Admitting: Internal Medicine

## 2013-05-13 ENCOUNTER — Ambulatory Visit (INDEPENDENT_AMBULATORY_CARE_PROVIDER_SITE_OTHER): Payer: Federal, State, Local not specified - PPO | Admitting: Internal Medicine

## 2013-05-13 VITALS — BP 144/96 | HR 75 | Temp 98.2°F | Ht 67.0 in | Wt 168.0 lb

## 2013-05-13 DIAGNOSIS — Z0000 Encounter for general adult medical examination without abnormal findings: Secondary | ICD-10-CM

## 2013-05-13 DIAGNOSIS — I10 Essential (primary) hypertension: Secondary | ICD-10-CM

## 2013-05-13 DIAGNOSIS — M549 Dorsalgia, unspecified: Secondary | ICD-10-CM

## 2013-05-13 DIAGNOSIS — E785 Hyperlipidemia, unspecified: Secondary | ICD-10-CM

## 2013-05-13 NOTE — Progress Notes (Signed)
Subjective:    Patient ID: Devin Stanton, male    DOB: 12-Jan-1961, 52 y.o.   MRN: [id number]  HPI Devin Stanton presents for a general wellness exam. In the interval he has seen Dr. Patty Sermons for a full cardiac evaluation due to a very positive family history. Fortunately the testing was normal including EKG, Myoview, 2 D echo all normal.   He denies any major illness, surgery or injury. He has been fit. Current with dentist, last eye exam 1 year ago with exam coming up on Dec. 11th. He does exercise some.   Past Medical History  Diagnosis Date  . ED (erectile dysfunction)   . Backache, unspecified   . Unspecified personal history presenting hazards to health   . Unspecified essential hypertension     controlled  . Hyperlipidemia     controlled medically  . Nephrolithiasis     1995   Past Surgical History  Procedure Laterality Date  . Tonsillectomy    . Vasectomy     Family History  Problem Relation Age of Onset  . Parkinsonism Mother   . Macular degeneration Mother   . Leukemia Father   . Coronary artery disease Father   . Heart disease Brother     CABG x 5  . Colon cancer Neg Hx   . Esophageal cancer Neg Hx   . Stomach cancer Neg Hx    History   Social History  . Marital Status: Married    Spouse Name: N/A    Number of Children: 3  . Years of Education: 16   Occupational History  . pilot     Social History Main Topics  . Smoking status: Never Smoker   . Smokeless tobacco: Current User  . Alcohol Use: 1.5 oz/week    3 drink(s) per week     Comment: wine or scotch  . Drug Use: No  . Sexual Activity: Yes    Partners: Female   Other Topics Concern  . Not on file   Social History Narrative   Devin Stanton. 1st -'87 - 6 years/divorced. 2nd marriage- '06-08, 3rd marriage 12/10   1 son- '91,step son '91, step daughter '93. 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    Current Outpatient  Prescriptions on File Prior to Visit  Medication Sig Dispense Refill  . aspirin 81 MG tablet Take 81 mg by mouth daily.      . benazepril-hydrochlorthiazide (LOTENSIN HCT) 20-25 MG per tablet TAKE 1 TABLET BY MOUTH ONCE DAILY  30 tablet  5  . CRESTOR 10 MG tablet TAKE 1 TABLET BY MOUTH DAILY  30 tablet  5   No current facility-administered medications on file prior to visit.      Review of Systems Constitutional:  Negative for fever, chills, activity change and unexpected weight change.  HEENT:  Negative for hearing loss, ear pain, congestion, neck stiffness and postnasal drip. Negative for sore throat or swallowing problems. Negative for dental complaints.   Eyes: Negative for vision loss or change in visual acuity.  Respiratory: Negative for chest tightness and wheezing. Negative for DOE.   Cardiovascular: Negative for chest pain or palpitations. No decreased exercise tolerance Gastrointestinal: No change in bowel habit. No bloating or gas. No reflux or indigestion Genitourinary: Negative for urgency, frequency, flank pain and difficulty urinating.  Musculoskeletal: Negative for myalgias, back pain, arthralgias and gait problem.  Neurological: Negative for dizziness, tremors, weakness and  headaches.  Hematological: Negative for adenopathy.  Psychiatric/Behavioral: Negative for behavioral problems and dysphoric mood.       Objective:   Physical Exam Filed Vitals:   05/13/13 1339  BP: 144/96  Pulse: 75  Temp: 98.2 F (36.8 C)   Wt Readings from Last 3 Encounters:  05/13/13 168 lb (76.204 kg)  09/07/12 168 lb 3.2 oz (76.295 kg)  04/28/12 160 lb (72.576 kg)   Gen'l: Well nourished well developed     male in no acute distress  HEENT: Head: Normocephalic and atraumatic. Right Ear: External ear normal. EAC/TM nl. Left Ear: External ear normal.  EAC/TM nl. Nose: Nose normal. Mouth/Throat: Oropharynx is clear and moist. Dentition - native, in good repair. No buccal or palatal  lesions. Posterior pharynx clear. Eyes: Conjunctivae and sclera clear. EOM intact. Pupils are equal, round, and reactive to light. Right eye exhibits no discharge. Left eye exhibits no discharge. Neck: Normal range of motion. Neck supple. No JVD present. No tracheal deviation present. No thyromegaly present.  Cardiovascular: Normal rate, regular rhythm, no gallop, no friction rub, no murmur heard.      Quiet precordium. 2+ radial and DP pulses . No carotid bruits Pulmonary/Chest: Effort normal. No respiratory distress or increased WOB, no wheezes, no rales. No chest wall deformity or CVAT. Abdomen: Soft. Bowel sounds are normal in all quadrants. He exhibits no distension, no tenderness, no rebound or guarding, No heptosplenomegaly  Genitourinary:   Musculoskeletal: Normal range of motion. He exhibits no edema and no tenderness.       Small and large joints without redness, synovial thickening or deformity. Full range of motion preserved about all small, median and large joints.  Lymphadenopathy:    He has no cervical or supraclavicular adenopathy.  Neurological: He is alert and oriented to person, place, and time. CN II-XII intact. DTRs 2+ and symmetrical biceps, radial and patellar tendons. Cerebellar function normal with no tremor, rigidity, normal gait and station.  Skin: Skin is warm and dry. No rash noted. No erythema.  Psychiatric: He has a normal mood and affect. His behavior is normal. Thought content normal.          Assessment & Plan:

## 2013-05-13 NOTE — Progress Notes (Signed)
Pre visit review using our clinic review tool, if applicable. No additional management support is needed unless otherwise documented below in the visit note. 

## 2013-05-13 NOTE — Patient Instructions (Signed)
Thanks for coming to see me.  Your exam is normal. Will check labs: lipids, liver functions and Bmet to day - results will be posted to MyChart.  You are current with immunizations, colorectal cancer screening, prostate cancer screening with a normal PSA last year and in 2011 - due for repeat in 2015.  Keep up the investment in your health, it pays great dividends.

## 2013-05-14 ENCOUNTER — Telehealth: Payer: Self-pay | Admitting: *Deleted

## 2013-05-14 NOTE — Telephone Encounter (Signed)
Pt called requesting 30day supply of Cialis for urinary urgency.  Please advise

## 2013-05-15 NOTE — Assessment & Plan Note (Signed)
Interval history notable for thorough Cardiology work up: normal 2 D echo, normal Myoview w/ no evidence of ischemia, normal :G and exam by Dr. Patty Sermons. He has had no major illness, surgery or injury. Physical exam is normal. Reviewed recent lab results: July '12 normal CBC; October 2, '13 - normal Cmet w/ glucose 104, Na 132 (stable to previous years), Lipids TC 191 HDL 72 LDL 75.4. He is current with colorectal cancer screening. Discussed pros and cons of prostate cancer screening (USPHCTF recommendations reviewed and ACU April '13 recommendations) and he defers evaluation at this time with last PSA Oct '13 = 0.47. Immunizations are up to date.   In summary A very healthy man. He will return in 18-24 months for general examination, sooner as needed.

## 2013-05-15 NOTE — Assessment & Plan Note (Signed)
Doing well with no complaint of active back pain, no limitations in activity

## 2013-05-15 NOTE — Assessment & Plan Note (Signed)
BP Readings from Last 3 Encounters:  05/13/13 144/96  09/07/12 120/78  04/23/12 130/88   Taking and tolerating medication. Good control. Recent lab with normal Bmet  Plan Continue present medications.

## 2013-05-17 MED ORDER — TADALAFIL 5 MG PO TABS: 5.0000 mg | ORAL_TABLET | Freq: Every day | ORAL | Status: DC | PRN

## 2013-05-17 NOTE — Telephone Encounter (Signed)
Ok for cialis 5 mg qd #30 refill x 3. May need a PA documenting BPH with [facility].

## 2013-05-17 NOTE — Telephone Encounter (Signed)
.  Left message on VM Rx sent to pharmacy.

## 2013-06-14 ENCOUNTER — Other Ambulatory Visit (INDEPENDENT_AMBULATORY_CARE_PROVIDER_SITE_OTHER): Payer: Federal, State, Local not specified - PPO

## 2013-06-14 DIAGNOSIS — E785 Hyperlipidemia, unspecified: Secondary | ICD-10-CM

## 2013-06-14 DIAGNOSIS — I10 Essential (primary) hypertension: Secondary | ICD-10-CM

## 2013-06-14 LAB — COMPREHENSIVE METABOLIC PANEL
ALBUMIN: 4.9 g/dL (ref 3.5–5.2)
ALK PHOS: 49 U/L (ref 39–117)
ALT: 28 U/L (ref 0–53)
AST: 23 U/L (ref 0–37)
BUN: 15 mg/dL (ref 6–23)
CO2: 28 mEq/L (ref 19–32)
Calcium: 10 mg/dL (ref 8.4–10.5)
Chloride: 98 mEq/L (ref 96–112)
Creatinine, Ser: 1.1 mg/dL (ref 0.4–1.5)
GFR: 76 mL/min (ref >60.00)
Glucose, Bld: 107 mg/dL — ABNORMAL HIGH (ref 70–99)
POTASSIUM: 4.8 meq/L (ref 3.5–5.1)
SODIUM: 135 meq/L (ref 135–145)
TOTAL PROTEIN: 8.1 g/dL (ref 6.0–8.3)
Total Bilirubin: 0.9 mg/dL (ref 0.3–1.2)

## 2013-06-14 LAB — LIPID PANEL
CHOL/HDL RATIO: 2
Cholesterol: 162 mg/dL (ref 0–200)
HDL: 74.1 mg/dL (ref >39.00)
LDL CALC: 81 mg/dL (ref 0–99)
Triglycerides: 36 mg/dL (ref 0.0–149.0)
VLDL: 7.2 mg/dL (ref 0.0–40.0)

## 2013-06-14 LAB — HEPATIC FUNCTION PANEL
ALT: 28 U/L (ref 0–53)
AST: 23 U/L (ref 0–37)
Albumin: 4.9 g/dL (ref 3.5–5.2)
Alkaline Phosphatase: 49 U/L (ref 39–117)
Bilirubin, Direct: 0.1 mg/dL (ref 0.0–0.3)
TOTAL PROTEIN: 8.1 g/dL (ref 6.0–8.3)
Total Bilirubin: 0.9 mg/dL (ref 0.3–1.2)

## 2013-08-24 ENCOUNTER — Other Ambulatory Visit: Payer: Self-pay | Admitting: Internal Medicine

## 2013-10-19 ENCOUNTER — Other Ambulatory Visit: Payer: Self-pay | Admitting: *Deleted

## 2013-10-19 MED ORDER — BENAZEPRIL-HYDROCHLOROTHIAZIDE 20-25 MG PO TABS: ORAL_TABLET | ORAL | Status: DC

## 2014-05-13 ENCOUNTER — Telehealth: Payer: Self-pay

## 2014-05-13 ENCOUNTER — Other Ambulatory Visit: Payer: Self-pay | Admitting: Internal Medicine

## 2014-05-13 NOTE — Telephone Encounter (Signed)
Pt has appt scheduled with Webb Silversmith NP on 05/16/14.

## 2014-05-13 NOTE — Telephone Encounter (Signed)
PLEASE NOTE: All timestamps contained within this report are represented as [place] Standard Time. CONFIDENTIALTY NOTICE: This fax transmission is intended only for the addressee. It contains information that is legally privileged, confidential or otherwise protected from use or disclosure. If you are not the intended recipient, you are strictly prohibited from reviewing, disclosing, copying using or disseminating any of this information or taking any action in reliance on or regarding this information. If you have received this fax in error, please notify us immediately by telephone so that we can arrange for its return to [place]. Phone: [phone], Toll-Free: [phone], Fax: [phone] Page: 1 of 1 Call Id: [id number] [place] Patient Name: Devin Stanton Gender: Male DOB: 06-09-1961 Age: 53 Y 11 M 25 D Return Phone Number: [phone] (Primary), [phone] (Secondary) Address: [address] City/State/Zip: [place] [address] Client  [facility] - Client Client Site [place] Type Call Caller Name Adir Schicker Phone Number [phone] Relationship To Patient Spouse Is this call to report lab results? No Call Type General Information Initial Comment Caller states her husband has been having pain in his back. Transferred back to the office line per directives. General Information Type Appointment Nurse Assessment Guidelines Guideline Title Affirmed Question Affirmed Notes Nurse Date/Time (Eastern Time) Disp. Time Eilene Ghazi Time) Disposition Final User 05/13/2014 11:21:29 AM General Information Provided Yes Laney Pastor

## 2014-05-16 ENCOUNTER — Ambulatory Visit (INDEPENDENT_AMBULATORY_CARE_PROVIDER_SITE_OTHER)
Admission: RE | Admit: 2014-05-16 | Discharge: 2014-05-16 | Disposition: A | Discharge status: Home / Self Care | Payer: Federal, State, Local not specified - PPO | Source: Ambulatory Visit | Attending: Internal Medicine | Admitting: Internal Medicine

## 2014-05-16 ENCOUNTER — Ambulatory Visit (INDEPENDENT_AMBULATORY_CARE_PROVIDER_SITE_OTHER): Payer: Federal, State, Local not specified - PPO | Admitting: Internal Medicine

## 2014-05-16 ENCOUNTER — Encounter: Payer: Self-pay | Admitting: Internal Medicine

## 2014-05-16 VITALS — BP 158/100 | HR 71 | Temp 98.5°F | Wt 161.0 lb

## 2014-05-16 DIAGNOSIS — M792 Neuralgia and neuritis, unspecified: Secondary | ICD-10-CM

## 2014-05-16 DIAGNOSIS — M25511 Pain in right shoulder: Secondary | ICD-10-CM

## 2014-05-16 DIAGNOSIS — I10 Essential (primary) hypertension: Secondary | ICD-10-CM

## 2014-05-16 MED ORDER — HYDROCODONE-ACETAMINOPHEN 5-325 MG PO TABS: 1.0000 | ORAL_TABLET | Freq: Four times a day (QID) | ORAL | Status: DC | PRN

## 2014-05-16 MED ORDER — BENAZEPRIL-HYDROCHLOROTHIAZIDE 20-25 MG PO TABS: ORAL_TABLET | ORAL | Status: DC

## 2014-05-16 NOTE — Progress Notes (Signed)
Pre visit review using our clinic review tool, if applicable. No additional management support is needed unless otherwise documented below in the visit note. 

## 2014-05-16 NOTE — Progress Notes (Signed)
Subjective:    Patient ID: Devin Stanton, male    DOB: 1961/02/23, 53 y.o.   MRN: [id number]  HPI  Pt presents to the clinic today with c/o right shoulder pain. This started 2-3 weeks. He describes it as a sharp, shooting pain. The pain does radiate down the right arm. He does have associated numbness and tingling in that arm. He has tried Aleve, Tylenol, Ibuprofen and Flexeril without much relief. He does report multiple injuries in the past but nothing recent.  Additionally, he has a new patient appt with Dr. Diona Browner 05/31/2014. He wants to know if he can get a 30 day supply of his medications to last him until his new pt appt.  HTN: 158/100. He has been out of his medication for the last 5 days. He denies chest pain, chest tightness, shortness of breath, headache, blurred vision or dizziness.    Review of Systems      Past Medical History  Diagnosis Date  . ED (erectile dysfunction)   . Backache, unspecified   . Unspecified personal history presenting hazards to health   . Unspecified essential hypertension     controlled  . Hyperlipidemia     controlled medically  . Nephrolithiasis     1995    Current Outpatient Prescriptions  Medication Sig Dispense Refill  . aspirin 81 MG tablet Take 81 mg by mouth daily.    . benazepril-hydrochlorthiazide (LOTENSIN HCT) 20-25 MG per tablet TAKE 1 TABLET BY MOUTH ONCE DAILY 30 tablet 5  . CRESTOR 10 MG tablet TAKE 1 TABLET BY MOUTH ONCE DAILY 30 tablet 10  . tadalafil (CIALIS) 5 MG tablet Take 1 tablet (5 mg total) by mouth daily as needed. Take 1 tablet (5 mg total) by mouth daily as needed for BPH with AUA. 30 tablet 3   No current facility-administered medications for this visit.    No Known Allergies  Family History  Problem Relation Age of Onset  . Parkinsonism Mother   . Macular degeneration Mother   . Leukemia Father   . Coronary artery disease Father   . Heart disease Brother     CABG x 5  . Colon cancer Neg Hx     . Esophageal cancer Neg Hx   . Stomach cancer Neg Hx     History   Social History  . Marital Status: Married    Spouse Name: N/A    Number of Children: 3  . Years of Education: 16   Occupational History  . pilot     Social History Main Topics  . Smoking status: Never Smoker   . Smokeless tobacco: Current User  . Alcohol Use: 1.5 oz/week    3 drink(s) per week     Comment: wine or scotch  . Drug Use: No  . Sexual Activity:    Partners: Female   Other Topics Concern  . Not on file   Social History Narrative   [employer]. 1st -'87 - 6 years/divorced. 2nd marriage- '06-08, 3rd marriage 12/10   1 son- '35,step son '91, step daughter '93. 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     Constitutional: Denies fever, malaise, fatigue, headache or abrupt weight changes.  Respiratory: Denies difficulty breathing, shortness of breath, cough or sputum production.   Cardiovascular: Denies chest pain, chest tightness, palpitations or swelling in the hands or feet.  Musculoskeletal: Pt reports right shoulder pain. Denies difficulty with  gait, muscle pain or joint swelling.    No other specific complaints in a complete review of systems (except as listed in HPI above).  Objective:   Physical Exam   BP 158/100 mmHg  Pulse 71  Temp(Src) 98.5 F (36.9 C) (Oral)  Wt 161 lb (73.029 kg)  SpO2 99% Wt Readings from Last 3 Encounters:  05/16/14 161 lb (73.029 kg)  05/13/13 168 lb (76.204 kg)  09/07/12 168 lb 3.2 oz (76.295 kg)    General: Appears his stated age, well developed, well nourished in NAD. Cardiovascular: Normal rate and rhythm. S1,S2 noted.  No murmur, rubs or gallops noted.  Pulmonary/Chest: Normal effort and positive vesicular breath sounds. No respiratory distress. No wheezes, rales or ronchi noted.  Musculoskeletal: Normal internal and external rotation of the right shoulder. Pain with palpation of  the clavicle and AC joint. Pain with palpation over the trapezius. Decreased flexion of the cervical spine. Normal extension. Decreased lateral rotation. Pain with palpation over the cervical spine. Strength 5/5 BUE.  Neurological: Sensation intact to bilateral hands.  +DTRs bilaterally.   BMET    Component Value Date/Time   NA 135 06/14/2013 1056   K 4.8 06/14/2013 1056   CL 98 06/14/2013 1056   CO2 28 06/14/2013 1056   GLUCOSE 107* 06/14/2013 1056   BUN 15 06/14/2013 1056   CREATININE 1.1 06/14/2013 1056   CALCIUM 10.0 06/14/2013 1056   GFRNONAA 90.32 04/17/2010 1042   GFRAA 103 03/11/2008 1122    Lipid Panel     Component Value Date/Time   CHOL 162 06/14/2013 1056   TRIG 36.0 06/14/2013 1056   HDL 74.10 06/14/2013 1056   CHOLHDL 2 06/14/2013 1056   VLDL 7.2 06/14/2013 1056   LDLCALC 81 06/14/2013 1056    CBC    Component Value Date/Time   WBC 7.5 01/02/2011 1520   RBC 4.16* 01/02/2011 1520   HGB 12.9* 01/02/2011 1520   HCT 37.7* 01/02/2011 1520   PLT 233.0 01/02/2011 1520   MCV 90.6 01/02/2011 1520   MCHC 34.3 01/02/2011 1520   RDW 12.3 01/02/2011 1520   LYMPHSABS 2.0 01/02/2011 1520   MONOABS 0.8 01/02/2011 1520   EOSABS 0.1 01/02/2011 1520   BASOSABS 0.0 01/02/2011 1520    Hgb A1C No results found for: HGBA1C      Assessment & Plan:   HTN:  Will give 30 day supply of BP medication eRx sent to pharmacy  Right shoulder pain/radicular pain in right arm:  Will xray cervical spine and right shoulder today RX for Norco given today No steroids at this time due to elevated blood pressure  Will follow up with you after xrays are done

## 2014-05-16 NOTE — Patient Instructions (Signed)

## 2014-05-17 ENCOUNTER — Telehealth: Payer: Self-pay | Admitting: Internal Medicine

## 2014-05-17 NOTE — Telephone Encounter (Signed)
emmi emailed °

## 2014-05-31 ENCOUNTER — Encounter: Payer: Self-pay | Admitting: Family Medicine

## 2014-05-31 ENCOUNTER — Ambulatory Visit (INDEPENDENT_AMBULATORY_CARE_PROVIDER_SITE_OTHER): Payer: Federal, State, Local not specified - PPO | Admitting: Family Medicine

## 2014-05-31 VITALS — BP 120/80 | HR 94 | Temp 98.5°F | Ht 67.0 in | Wt 159.0 lb

## 2014-05-31 DIAGNOSIS — Z23 Encounter for immunization: Secondary | ICD-10-CM

## 2014-05-31 DIAGNOSIS — M5412 Radiculopathy, cervical region: Secondary | ICD-10-CM | POA: Insufficient documentation

## 2014-05-31 DIAGNOSIS — E785 Hyperlipidemia, unspecified: Secondary | ICD-10-CM

## 2014-05-31 DIAGNOSIS — I10 Essential (primary) hypertension: Secondary | ICD-10-CM

## 2014-05-31 DIAGNOSIS — Z8249 Family history of ischemic heart disease and other diseases of the circulatory system: Secondary | ICD-10-CM

## 2014-05-31 DIAGNOSIS — Z125 Encounter for screening for malignant neoplasm of prostate: Secondary | ICD-10-CM

## 2014-05-31 LAB — LIPID PANEL
Cholesterol: 214 mg/dL — ABNORMAL HIGH (ref 0–200)
HDL: 91 mg/dL (ref >39.00)
LDL Cholesterol: 113 mg/dL — ABNORMAL HIGH (ref 0–99)
NonHDL: 123
Total CHOL/HDL Ratio: 2
Triglycerides: 48 mg/dL (ref 0.0–149.0)
VLDL: 9.6 mg/dL (ref 0.0–40.0)

## 2014-05-31 LAB — PSA: PSA: 0.8 ng/mL (ref 0.10–4.00)

## 2014-05-31 LAB — COMPREHENSIVE METABOLIC PANEL
ALBUMIN: 5.1 g/dL (ref 3.5–5.2)
ALT: 47 U/L (ref 0–53)
AST: 33 U/L (ref 0–37)
Alkaline Phosphatase: 51 U/L (ref 39–117)
BUN: 21 mg/dL (ref 6–23)
CALCIUM: 10.4 mg/dL (ref 8.4–10.5)
CO2: 29 meq/L (ref 19–32)
Chloride: 98 mEq/L (ref 96–112)
Creatinine, Ser: 1.1 mg/dL (ref 0.4–1.5)
GFR: 76.54 mL/min (ref >60.00)
Glucose, Bld: 109 mg/dL — ABNORMAL HIGH (ref 70–99)
POTASSIUM: 5.1 meq/L (ref 3.5–5.1)
SODIUM: 136 meq/L (ref 135–145)
TOTAL PROTEIN: 7.9 g/dL (ref 6.0–8.3)
Total Bilirubin: 0.7 mg/dL (ref 0.2–1.2)

## 2014-05-31 MED ORDER — ROSUVASTATIN CALCIUM 10 MG PO TABS: 10.0000 mg | ORAL_TABLET | Freq: Every day | ORAL | Status: DC

## 2014-05-31 MED ORDER — BENAZEPRIL-HYDROCHLOROTHIAZIDE 20-25 MG PO TABS: ORAL_TABLET | ORAL | Status: DC

## 2014-05-31 MED ORDER — DICLOFENAC SODIUM 75 MG PO TBEC: 75.0000 mg | DELAYED_RELEASE_TABLET | Freq: Two times a day (BID) | ORAL | Status: DC

## 2014-05-31 NOTE — Patient Instructions (Addendum)
Schedule CPX in next 3-4 months. Stop at lab on way out. Start diclofenac twice daily x 3-4 days, then as needed.  Call if not improving as expected in 2 weeks.

## 2014-05-31 NOTE — Progress Notes (Signed)
Pre visit review using our clinic review tool, if applicable. No additional management support is needed unless otherwise documented below in the visit note. 

## 2014-05-31 NOTE — Assessment & Plan Note (Signed)
Well controlled. Continue current medication.  

## 2014-05-31 NOTE — Progress Notes (Signed)
Subjective:    Patient ID: Devin Stanton, male    DOB: 09/16/1960, 53 y.o.   MRN: [id number]  HPI  53 year old male presents to establish.  Previously seen by Dr. Linda Hedges. Last CPX 05/2013.   Most recently seen on 12/8 by Cecille Po for HTN med refill and new onset  Right shoulder pain. No specific trigger or injury. Pain  in right posterior shoulder, radiates up neck.  Right hand numb at times when playing piano. No decreased grip strength. Pain radiates down right arm into 1 and 2nd digit.  Gradually worse in 2-3 months. At that appt he was evaluated with X-ray of neck  (1. Osseous foraminal narrowing bilaterally at C3-4 and C4-5 secondary to uncovertebral spurring. 2. Slight anterolisthesis at C4-5 is degenerative.)  and shoulder ( neg):    Started on flexeril and norco for pain. He also tried ibuprofen with some relief.  He reports continued pain despite this.  Hx of past football injury in highschool. He had similar pain in right shoulder in past.. Got better with muscle relaxers and time.    Followed by cardiology  ( Dr. Mare Ferrari) for early family hx of CAD.  Hypertension:  Well controlled on benazepril HCTZ BP Readings from Last 3 Encounters:  05/31/14 120/80  05/16/14 158/100  05/13/13 144/96  Using medication without problems or lightheadedness:  None Chest pain with exertion:None Edema:None Short of breath: Average home BPs: Other issues:  Elevated Cholesterol: Due for re-eval. On crestor. Lab Results  Component Value Date   CHOL 162 06/14/2013   HDL 74.10 06/14/2013   LDLCALC 81 06/14/2013   LDLDIRECT 167.2 04/14/2009   TRIG 36.0 06/14/2013   CHOLHDL 2 06/14/2013  Christel Mormon medications without problems:none Muscle aches: None Diet compliance:healthy Exercise:Golf, has not been able to play with shoulder pain Other complaints:    Review of Systems  Constitutional: Negative for fever, fatigue and unexpected weight change.  HENT: Negative for  congestion, ear pain, postnasal drip, rhinorrhea, sore throat and trouble swallowing.   Eyes: Negative for pain.  Respiratory: Negative for cough, shortness of breath and wheezing.   Cardiovascular: Negative for chest pain, palpitations and leg swelling.  Gastrointestinal: Negative for nausea, abdominal pain, diarrhea, constipation and blood in stool.  Genitourinary: Negative for dysuria, urgency, hematuria, discharge, penile swelling, scrotal swelling, difficulty urinating, penile pain and testicular pain.  Skin: Negative for rash.  Neurological: Negative for syncope, weakness, light-headedness, numbness and headaches.  Psychiatric/Behavioral: Negative for behavioral problems and dysphoric mood. The patient is not nervous/anxious.        Objective:   Physical Exam  Constitutional: Vital signs are normal. He appears well-developed and well-nourished.  HENT:  Head: Normocephalic.  Right Ear: Hearing normal.  Left Ear: Hearing normal.  Nose: Nose normal.  Mouth/Throat: Oropharynx is clear and moist and mucous membranes are normal.  Neck: Trachea normal. Carotid bruit is not present. No thyroid mass and no thyromegaly present.  Cardiovascular: Normal rate, regular rhythm and normal pulses.  Exam reveals no gallop, no distant heart sounds and no friction rub.   No murmur heard. No peripheral edema  Pulmonary/Chest: Effort normal and breath sounds normal. No respiratory distress.  Musculoskeletal:       Right shoulder: He exhibits normal range of motion, no tenderness, no bony tenderness and no swelling.       Cervical back: He exhibits decreased range of motion and tenderness. He exhibits no bony tenderness.  Positive spurling on right  no atrophy  Neurological: He has normal strength. He displays no atrophy. No sensory deficit. He exhibits normal muscle tone.  Skin: Skin is warm, dry and intact. No rash noted.  Psychiatric: He has a normal mood and affect. His speech is normal and  behavior is normal. Thought content normal.          Assessment & Plan:

## 2014-05-31 NOTE — Assessment & Plan Note (Signed)
Causing shoulder, upper back and neck pain as well as radiating to right arm and numbness in rigth hand.  Treat with NSIADS, flexeril as needed. Limit motion of neck as able.  if not improving consider MRI neck and referral  to PT as well as consideration of ESI.

## 2014-05-31 NOTE — Assessment & Plan Note (Signed)
Due for re-eval on crestor

## 2014-09-13 ENCOUNTER — Encounter: Payer: Self-pay | Admitting: Family Medicine

## 2014-09-13 ENCOUNTER — Ambulatory Visit (INDEPENDENT_AMBULATORY_CARE_PROVIDER_SITE_OTHER): Payer: Federal, State, Local not specified - PPO | Admitting: Family Medicine

## 2014-09-13 VITALS — BP 148/80 | HR 86 | Temp 98.3°F | Ht 67.5 in | Wt 161.8 lb

## 2014-09-13 DIAGNOSIS — R7303 Prediabetes: Secondary | ICD-10-CM

## 2014-09-13 DIAGNOSIS — Z0000 Encounter for general adult medical examination without abnormal findings: Secondary | ICD-10-CM

## 2014-09-13 DIAGNOSIS — I10 Essential (primary) hypertension: Secondary | ICD-10-CM | POA: Diagnosis not present

## 2014-09-13 DIAGNOSIS — E785 Hyperlipidemia, unspecified: Secondary | ICD-10-CM | POA: Diagnosis not present

## 2014-09-13 DIAGNOSIS — R7309 Other abnormal glucose: Secondary | ICD-10-CM

## 2014-09-13 DIAGNOSIS — Z1159 Encounter for screening for other viral diseases: Secondary | ICD-10-CM

## 2014-09-13 MED ORDER — DICLOFENAC SODIUM 75 MG PO TBEC: 75.0000 mg | DELAYED_RELEASE_TABLET | Freq: Two times a day (BID) | ORAL | Status: DC

## 2014-09-13 MED ORDER — FLUTICASONE PROPIONATE 0.005 % EX OINT: 1.0000 "application " | TOPICAL_OINTMENT | Freq: Two times a day (BID) | CUTANEOUS | Status: DC

## 2014-09-13 NOTE — Progress Notes (Signed)
Subjective:    Patient ID: Devin Stanton, male    DOB: [birth year], 54 y.o.   MRN: [id number]  HPI  54 year old male presents for wellness.  Hypertension:   Moderate control on benazapril HCTZ BP Readings from Last 3 Encounters:  09/13/14 148/80  05/31/14 120/80  05/16/14 158/100  Using medication without problems or lightheadedness:  none Chest pain with exertion:None Edema:None Short of breath:None Average home BPs:118-148/74-88 Other issues: Followed by Dr. Mare Ferrari for early family CAD hx.  Prediabetes, stable.  No family history of diabetes.   Elevated Cholesterol: On crestor  at goal LD 130. Lab Results  Component Value Date   CHOL 214* 05/31/2014   HDL 91.00 05/31/2014   LDLCALC 113* 05/31/2014   LDLDIRECT 167.2 04/14/2009   TRIG 48.0 05/31/2014   CHOLHDL 2 05/31/2014  Using medications without problems:None Muscle aches: None Diet compliance: moderate, avoids carbs Exercise:none, some golf Other complaints:  History   Social History  . Marital Status: Married    Spouse Name: N/A  . Number of Children: 3  . Years of Education: 16   Occupational History  . pilot     Social History Main Topics  . Smoking status: Never Smoker   . Smokeless tobacco: Current User  . Alcohol Use: 2.4 oz/week    4 Standard drinks or equivalent per week     Comment: wine or scotch  . Drug Use: No  . Sexual Activity:    Partners: Female   Other Topics Concern  . None   Social History Narrative   [employer] / [employer] school 1st -'63 - 6 years/divorced. 2nd marriage- '06-08, 3rd marriage 12/10   1 son- '46,step son '91, step daughter '93.    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        Review of Systems  Constitutional: Negative for fever, fatigue and unexpected weight change.  HENT: Negative for congestion, ear pain, postnasal drip and rhinorrhea.        Uses zyrtec occ for allergies  Eyes:  Negative for pain.  Respiratory: Negative for cough, shortness of breath and wheezing.   Cardiovascular: Negative for chest pain, palpitations and leg swelling.  Gastrointestinal: Negative for nausea, abdominal pain, diarrhea, constipation and blood in stool.  Genitourinary: Negative for dysuria, hematuria, discharge, difficulty urinating, penile pain and testicular pain.  Skin: Negative for rash.  Neurological: Negative for syncope, weakness, light-headedness, numbness and headaches.  Psychiatric/Behavioral: Negative for behavioral problems and dysphoric mood. The patient is not nervous/anxious.        Objective:   Physical Exam  Constitutional: He appears well-developed and well-nourished.  Non-toxic appearance. He does not appear ill. No distress.  HENT:  Head: Normocephalic and atraumatic.  Right Ear: Hearing, tympanic membrane, external ear and ear canal normal.  Left Ear: Hearing, tympanic membrane, external ear and ear canal normal.  Nose: Nose normal.  Mouth/Throat: Uvula is midline, oropharynx is clear and moist and mucous membranes are normal.  Eyes: Conjunctivae, EOM and lids are normal. Pupils are equal, round, and reactive to light. Lids are everted and swept, no foreign bodies found.  Neck: Trachea normal, normal range of motion and phonation normal. Neck supple. Carotid bruit is not present. No thyroid mass and no thyromegaly present.  Cardiovascular: Normal rate, regular rhythm, S1 normal, S2 normal, intact distal pulses and normal pulses.  Exam reveals no gallop.   No murmur heard. Pulmonary/Chest: Breath  sounds normal. He has no wheezes. He has no rhonchi. He has no rales.  Abdominal: Soft. Normal appearance and bowel sounds are normal. There is no hepatosplenomegaly. There is no tenderness. There is no rebound, no guarding and no CVA tenderness. No hernia. Hernia confirmed negative in the right inguinal area and confirmed negative in the left inguinal area.    Genitourinary: Prostate normal, testes normal and penis normal. Rectal exam shows no external hemorrhoid, no internal hemorrhoid, no fissure, no mass, no tenderness and anal tone normal. Guaiac negative stool. Prostate is not enlarged and not tender. Right testis shows no mass and no tenderness. Left testis shows no mass and no tenderness. No paraphimosis or penile tenderness.  Lymphadenopathy:    He has no cervical adenopathy.       Right: No inguinal adenopathy present.       Left: No inguinal adenopathy present.  Neurological: He is alert. He has normal strength and normal reflexes. No cranial nerve deficit or sensory deficit. Gait normal.  Skin: Skin is warm, dry and intact. No rash noted.  Psychiatric: He has a normal mood and affect. His speech is normal and behavior is normal. Judgment normal.          Assessment & Plan:  The patient's preventative maintenance and recommended screening tests for an annual wellness exam were reviewed in full today. Brought up to date unless services declined.  Counselled on the importance of diet, exercise, and its role in overall health and mortality. The patient's FH and SH was reviewed, including their home life, tobacco status, and drug and alcohol status.   Vaccines:uptodate  Colon:04/2011 nml, repeat in 10 years  Prostate: stable Lab Results  Component Value Date   PSA 0.80 05/31/2014   PSA 0.47 03/11/2012   PSA 0.58 04/17/2010  Use smokeless tobacco Hep C: will do today. HIV : refused

## 2014-09-13 NOTE — Progress Notes (Signed)
Pre visit review using our clinic review tool, if applicable. No additional management support is needed unless otherwise documented below in the visit note. 

## 2014-09-13 NOTE — Assessment & Plan Note (Signed)
Well controlled. Continue current medication.  

## 2014-09-13 NOTE — Assessment & Plan Note (Signed)
LDL at goal < 130 on crestor. Encouraged exercise, weight loss, healthy eating habits.

## 2014-09-13 NOTE — Assessment & Plan Note (Signed)
Info given and reviewed.

## 2014-09-13 NOTE — Patient Instructions (Addendum)
Try to incease exercise as able. Work on low [employer]. Work on quitting smokeless tobacco.  Stop at lab on way out for hep C screening.

## 2014-09-14 LAB — HEPATITIS C ANTIBODY (REFLEX): HCV Ab: <0.1 s/co ratio (ref 0.0–0.9)

## 2014-09-14 LAB — HCV COMMENT:

## 2015-01-02 ENCOUNTER — Other Ambulatory Visit: Payer: Self-pay | Admitting: Orthopedic Surgery

## 2015-01-02 DIAGNOSIS — M4722 Other spondylosis with radiculopathy, cervical region: Secondary | ICD-10-CM

## 2015-01-02 DIAGNOSIS — M5412 Radiculopathy, cervical region: Secondary | ICD-10-CM

## 2015-01-07 ENCOUNTER — Ambulatory Visit
Admission: RE | Admit: 2015-01-07 | Discharge: 2015-01-07 | Disposition: A | Discharge status: Home / Self Care | Payer: Federal, State, Local not specified - PPO | Source: Ambulatory Visit | Attending: Orthopedic Surgery | Admitting: Orthopedic Surgery

## 2015-01-07 DIAGNOSIS — M5030 Other cervical disc degeneration, unspecified cervical region: Secondary | ICD-10-CM | POA: Insufficient documentation

## 2015-01-07 DIAGNOSIS — M4722 Other spondylosis with radiculopathy, cervical region: Secondary | ICD-10-CM

## 2015-01-07 DIAGNOSIS — M5022 Other cervical disc displacement, mid-cervical region: Secondary | ICD-10-CM | POA: Diagnosis not present

## 2015-01-07 DIAGNOSIS — M5412 Radiculopathy, cervical region: Secondary | ICD-10-CM | POA: Diagnosis present

## 2015-06-11 HISTORY — PX: ANTERIOR CERVICAL DECOMP/DISCECTOMY FUSION: SHX1161

## 2015-06-21 ENCOUNTER — Other Ambulatory Visit: Payer: Self-pay | Admitting: *Deleted

## 2015-06-21 DIAGNOSIS — I10 Essential (primary) hypertension: Secondary | ICD-10-CM

## 2015-06-21 MED ORDER — ROSUVASTATIN CALCIUM 10 MG PO TABS: 10.0000 mg | ORAL_TABLET | Freq: Every day | ORAL | Status: DC

## 2015-06-21 MED ORDER — BENAZEPRIL-HYDROCHLOROTHIAZIDE 20-25 MG PO TABS: ORAL_TABLET | ORAL | Status: DC

## 2015-09-22 ENCOUNTER — Other Ambulatory Visit: Payer: Self-pay | Admitting: Family Medicine

## 2015-09-22 DIAGNOSIS — I10 Essential (primary) hypertension: Secondary | ICD-10-CM

## 2015-09-24 MED ORDER — ROSUVASTATIN CALCIUM 10 MG PO TABS: 10.0000 mg | ORAL_TABLET | Freq: Every day | ORAL | Status: DC

## 2015-09-24 MED ORDER — BENAZEPRIL-HYDROCHLOROTHIAZIDE 20-25 MG PO TABS: ORAL_TABLET | ORAL | Status: DC

## 2015-10-20 DIAGNOSIS — M542 Cervicalgia: Secondary | ICD-10-CM | POA: Diagnosis not present

## 2015-10-20 DIAGNOSIS — M4310 Spondylolisthesis, site unspecified: Secondary | ICD-10-CM | POA: Insufficient documentation

## 2015-10-20 DIAGNOSIS — Z6825 Body mass index (BMI) 25.0-25.9, adult: Secondary | ICD-10-CM | POA: Diagnosis not present

## 2015-10-20 DIAGNOSIS — M4312 Spondylolisthesis, cervical region: Secondary | ICD-10-CM | POA: Diagnosis not present

## 2015-10-20 DIAGNOSIS — I10 Essential (primary) hypertension: Secondary | ICD-10-CM | POA: Diagnosis not present

## 2015-11-21 DIAGNOSIS — Z85828 Personal history of other malignant neoplasm of skin: Secondary | ICD-10-CM | POA: Diagnosis not present

## 2015-11-21 DIAGNOSIS — L570 Actinic keratosis: Secondary | ICD-10-CM | POA: Diagnosis not present

## 2015-12-01 ENCOUNTER — Encounter: Payer: Self-pay | Admitting: Family Medicine

## 2016-03-20 ENCOUNTER — Telehealth: Payer: Self-pay | Admitting: Family Medicine

## 2016-03-20 NOTE — Telephone Encounter (Signed)
Patient Name: Devin Stanton  DOB: Jan 15, 1961    Initial Comment Caller's BP is 150/90-100 , no s/s    Nurse Assessment  Nurse: Christel Mormon, RN, Levada Dy Date/Time (Eastern Time): 03/20/2016 3:05:54 PM  Confirm and document reason for call. If symptomatic, describe symptoms. You must click the next button to save text entered. ---2 hrs ago - BP 158/98. He has been running this the past 2-3 days. He is currently on BP medication.  Has the patient traveled out of the country within the last 30 days? ---No  Does the patient have any new or worsening symptoms? ---Yes  Will a triage be completed? ---Yes  Related visit to physician within the last 2 weeks? ---No  Does the PT have any chronic conditions? (i.e. diabetes, asthma, etc.) ---Yes  List chronic conditions. ---HTN, high cholesterol.  Is this a behavioral health or substance abuse call? ---No     Guidelines    Guideline Title Affirmed Question Affirmed Notes  High Blood Pressure [1] BP ? 140/90 AND [2] taking BP medications    Final Disposition User   See PCP within [place], RN, Levada Dy    Referrals  REFERRED TO PCP OFFICE   Disagree/Comply: Leta Baptist

## 2016-03-20 NOTE — Telephone Encounter (Signed)
Agree with dispo 

## 2016-03-20 NOTE — Telephone Encounter (Signed)
Pt has appt with Dr Diona Browner on 03/22/16 at 3pm.

## 2016-03-22 ENCOUNTER — Encounter: Payer: Self-pay | Admitting: Family Medicine

## 2016-03-22 ENCOUNTER — Ambulatory Visit (INDEPENDENT_AMBULATORY_CARE_PROVIDER_SITE_OTHER): Payer: Federal, State, Local not specified - PPO | Admitting: Family Medicine

## 2016-03-22 VITALS — BP 142/96 | HR 97 | Temp 98.4°F | Ht 67.5 in | Wt 167.2 lb

## 2016-03-22 DIAGNOSIS — Z23 Encounter for immunization: Secondary | ICD-10-CM

## 2016-03-22 DIAGNOSIS — R7303 Prediabetes: Secondary | ICD-10-CM | POA: Diagnosis not present

## 2016-03-22 DIAGNOSIS — E782 Mixed hyperlipidemia: Secondary | ICD-10-CM | POA: Diagnosis not present

## 2016-03-22 DIAGNOSIS — I10 Essential (primary) hypertension: Secondary | ICD-10-CM

## 2016-03-22 LAB — CBC WITH DIFFERENTIAL/PLATELET
BASOS ABS: 0 {cells}/uL (ref 0–200)
Basophils Relative: 0 %
EOS PCT: 2 %
Eosinophils Absolute: 154 cells/uL (ref 15–500)
HEMATOCRIT: 41.6 % (ref 38.5–50.0)
HEMOGLOBIN: 14.5 g/dL (ref 13.2–17.1)
LYMPHS ABS: 2079 {cells}/uL (ref 850–3900)
Lymphocytes Relative: 27 %
MCH: 31.1 pg (ref 27.0–33.0)
MCHC: 34.9 g/dL (ref 32.0–36.0)
MCV: 89.3 fL (ref 80.0–100.0)
MONO ABS: 924 {cells}/uL (ref 200–950)
MPV: 10.3 fL (ref 7.5–12.5)
Monocytes Relative: 12 %
NEUTROS ABS: 4543 {cells}/uL (ref 1500–7800)
NEUTROS PCT: 59 %
Platelets: 301 10*3/uL (ref 140–400)
RBC: 4.66 MIL/uL (ref 4.20–5.80)
RDW: 12.7 % (ref 11.0–15.0)
WBC: 7.7 10*3/uL (ref 3.8–10.8)

## 2016-03-22 LAB — COMPREHENSIVE METABOLIC PANEL
ALT: 30 U/L (ref 9–46)
AST: 20 U/L (ref 10–35)
Albumin: 4.7 g/dL (ref 3.6–5.1)
Alkaline Phosphatase: 66 U/L (ref 40–115)
BUN: 13 mg/dL (ref 7–25)
CALCIUM: 10.3 mg/dL (ref 8.6–10.3)
CHLORIDE: 91 mmol/L — AB (ref 98–110)
CO2: 26 mmol/L (ref 20–31)
Creat: 1.09 mg/dL (ref 0.70–1.33)
GLUCOSE: 100 mg/dL — AB (ref 65–99)
POTASSIUM: 4.5 mmol/L (ref 3.5–5.3)
Sodium: 128 mmol/L — ABNORMAL LOW (ref 135–146)
Total Bilirubin: 0.6 mg/dL (ref 0.2–1.2)
Total Protein: 7.6 g/dL (ref 6.1–8.1)

## 2016-03-22 LAB — TSH: TSH: 1.58 m[IU]/L (ref 0.40–4.50)

## 2016-03-22 MED ORDER — BENAZEPRIL-HYDROCHLOROTHIAZIDE 20-25 MG PO TABS: ORAL_TABLET | ORAL | 0 refills | Status: DC

## 2016-03-22 MED ORDER — METOPROLOL SUCCINATE ER 25 MG PO TB24: 25.0000 mg | ORAL_TABLET | Freq: Every day | ORAL | 3 refills | Status: DC

## 2016-03-22 MED ORDER — ROSUVASTATIN CALCIUM 10 MG PO TABS: 10.0000 mg | ORAL_TABLET | Freq: Every day | ORAL | 0 refills | Status: DC

## 2016-03-22 NOTE — Progress Notes (Signed)
   Subjective:    Patient ID: Devin Stanton, male    DOB: 1960/09/14, 55 y.o.   MRN: [id number]  HPI  55 year old male with HTN presents with recently elevated BP measurements.  He has been checking BP in last few  Days.. Has noted BP elevated. Was feeling dizzy.  No new chest pain, no SOB, no HA.  No vision changes. At home he has measured 145-180/104-116  Hypertension, dx 18 years ago: On benazapril HCTZ  20/25. BP Readings from Last 3 Encounters:  03/22/16 (!) 142/96  09/13/14 (!) 148/80  05/31/14 120/80  Followed by Dr. Mare Ferrari in past for early family CAD hx. Has not seen in years. Using medication without problems or lightheadedness:  Chest pain with exertion:None Edema:None Short of breath: None Average home BPs: see above. Other issues:  Salt  Intake: not sure, wife avoids salt in foods.  Diet: healthy diet  Caffeine: 4 cups of coffee  smoking:  Smokeless tobacco.  Wt Readings from Last 3 Encounters:  03/22/16 167 lb 4 oz (75.9 kg)  09/13/14 161 lb 12 oz (73.4 kg)  05/31/14 159 lb (72.1 kg)  Body mass index is 25.81 kg/m. No new OTC meds.  Strong family history of HTN in Mom, DAD, brothers, sister.  Nml stress test several years ago. Stable inverted T wave years for years. Review of Systems  Constitutional: Negative for fatigue and fever.  HENT: Negative for ear pain.   Eyes: Negative for pain.  Respiratory: Negative for cough and shortness of breath.   Cardiovascular: Negative for chest pain, palpitations and leg swelling.  Gastrointestinal: Negative for abdominal pain.  Genitourinary: Negative for dysuria.  Musculoskeletal: Negative for arthralgias.  Neurological: Negative for syncope, light-headedness and headaches.  Psychiatric/Behavioral: Negative for dysphoric mood.       Objective:   Physical Exam  Constitutional: Vital signs are normal. He appears well-developed and well-nourished.  HENT:  Head: Normocephalic.  Right Ear: Hearing normal.   Left Ear: Hearing normal.  Nose: Nose normal.  Mouth/Throat: Oropharynx is clear and moist and mucous membranes are normal.  Neck: Trachea normal. Carotid bruit is not present. No thyroid mass and no thyromegaly present.  Cardiovascular: Normal rate, regular rhythm and normal pulses.  Exam reveals no gallop, no distant heart sounds and no friction rub.   No murmur heard. No peripheral edema  Pulmonary/Chest: Effort normal and breath sounds normal. No respiratory distress.  Skin: Skin is warm, dry and intact. No rash noted.  Psychiatric: He has a normal mood and affect. His speech is normal and behavior is normal. Thought content normal.          Assessment & Plan:

## 2016-03-22 NOTE — Assessment & Plan Note (Signed)
Due for re-eval. 

## 2016-03-22 NOTE — Progress Notes (Signed)
Pre visit review using our clinic review tool, if applicable. No additional management support is needed unless otherwise documented below in the visit note. 

## 2016-03-22 NOTE — Assessment & Plan Note (Signed)
Eval with labs.  Lifestyle modification.  Start Toprol XL 25 mg daily. Continue other BP meds.  Follow at home.  Follow up in 2 weeks for re-eval.

## 2016-03-22 NOTE — Patient Instructions (Addendum)
Stop at lab on way out. Start Toprol XL daily. Continue other medications. Decreased caffeine,  Salt, increase potassium foods. Decrease alcohol as able.  Stop smokeless tobacco.  Work on stress reduction. Follow BP at home, record and bring bring in measurements.

## 2016-03-25 ENCOUNTER — Telehealth: Payer: Self-pay | Admitting: Family Medicine

## 2016-03-25 NOTE — Telephone Encounter (Signed)
Patient returned Donna's call.  Patient said he already read Dr.Bedsole's note on lab work, so if that was the reason for the call you don't need to call patient back.

## 2016-03-25 NOTE — Telephone Encounter (Signed)
Noted  

## 2016-03-31 ENCOUNTER — Encounter: Payer: Self-pay | Admitting: Family Medicine

## 2016-04-05 ENCOUNTER — Encounter: Payer: Self-pay | Admitting: Family Medicine

## 2016-04-05 ENCOUNTER — Ambulatory Visit (INDEPENDENT_AMBULATORY_CARE_PROVIDER_SITE_OTHER): Payer: Federal, State, Local not specified - PPO | Admitting: Family Medicine

## 2016-04-05 DIAGNOSIS — I10 Essential (primary) hypertension: Secondary | ICD-10-CM

## 2016-04-05 MED ORDER — METOPROLOL SUCCINATE ER 50 MG PO TB24: 50.0000 mg | ORAL_TABLET | Freq: Every day | ORAL | 3 refills | Status: DC

## 2016-04-05 NOTE — Progress Notes (Signed)
   Subjective:    Patient ID: Devin Stanton, male    DOB: 1960-12-19, 55 y.o.   MRN: [id number]  HPI  55 year old male presents for 2 week follow up HTN.  Hypertension: Titrated up new start metoprolol XL to  50 mg daily  BP now under control. HR 71 Pt reports no fatigue or other SE. BP Readings from Last 3 Encounters:  04/05/16 132/80  03/22/16 (!) 142/96  09/13/14 (!) 148/80  Was feeling dizzy. Now better.  No new chest pain, no SOB, no HA.  No vision changes. N At home he has measured Using medication without problems or lightheadedness:  Chest pain with exertion: None Edema:None Short of breath: None Average home BPs: 150s/100s, 70s Other issues:   Review of Systems  Constitutional: Negative for fatigue.  HENT: Negative for ear pain.   Eyes: Negative for pain.  Respiratory: Negative for shortness of breath.   Cardiovascular: Negative for chest pain.       Objective:   Physical Exam  Constitutional: Vital signs are normal. He appears well-developed and well-nourished.  HENT:  Head: Normocephalic.  Right Ear: Hearing normal.  Left Ear: Hearing normal.  Nose: Nose normal.  Mouth/Throat: Oropharynx is clear and moist and mucous membranes are normal.  Neck: Trachea normal. Carotid bruit is not present. No thyroid mass and no thyromegaly present.  Cardiovascular: Normal rate, regular rhythm and normal pulses.  Exam reveals no gallop, no distant heart sounds and no friction rub.   No murmur heard. No peripheral edema  Pulmonary/Chest: Effort normal and breath sounds normal. No respiratory distress.  Skin: Skin is warm, dry and intact. No rash noted.  Psychiatric: He has a normal mood and affect. His speech is normal and behavior is normal. Thought content normal.          Assessment & Plan:

## 2016-04-05 NOTE — Progress Notes (Signed)
Pre visit review using our clinic review tool, if applicable. No additional management support is needed unless otherwise documented below in the visit note. 

## 2016-04-05 NOTE — Patient Instructions (Addendum)
Follow BP at home, call if with new  Meter BP > 140/90. For now continue metoprolol.

## 2016-04-05 NOTE — Assessment & Plan Note (Signed)
Improved control and no SE with addition of BBlocker.  No sure if home cuff accurate given still measuring high.. Will get new one.

## 2016-04-08 ENCOUNTER — Observation Stay (HOSPITAL_COMMUNITY)
Admission: EM | Admit: 2016-04-08 | Discharge: 2016-04-09 | Disposition: A | Discharge status: Home / Self Care | Payer: Federal, State, Local not specified - PPO | Attending: Internal Medicine | Admitting: Internal Medicine

## 2016-04-08 ENCOUNTER — Emergency Department (HOSPITAL_COMMUNITY): Payer: Federal, State, Local not specified - PPO

## 2016-04-08 ENCOUNTER — Other Ambulatory Visit: Payer: Self-pay

## 2016-04-08 ENCOUNTER — Encounter (HOSPITAL_COMMUNITY): Payer: Self-pay

## 2016-04-08 DIAGNOSIS — Z83518 Family history of other specified eye disorder: Secondary | ICD-10-CM | POA: Diagnosis not present

## 2016-04-08 DIAGNOSIS — Z9889 Other specified postprocedural states: Secondary | ICD-10-CM | POA: Insufficient documentation

## 2016-04-08 DIAGNOSIS — I200 Unstable angina: Secondary | ICD-10-CM | POA: Diagnosis not present

## 2016-04-08 DIAGNOSIS — R079 Chest pain, unspecified: Secondary | ICD-10-CM | POA: Diagnosis not present

## 2016-04-08 DIAGNOSIS — Z806 Family history of leukemia: Secondary | ICD-10-CM | POA: Diagnosis not present

## 2016-04-08 DIAGNOSIS — I4891 Unspecified atrial fibrillation: Secondary | ICD-10-CM | POA: Diagnosis not present

## 2016-04-08 DIAGNOSIS — Z820 Family history of epilepsy and other diseases of the nervous system: Secondary | ICD-10-CM | POA: Insufficient documentation

## 2016-04-08 DIAGNOSIS — E785 Hyperlipidemia, unspecified: Secondary | ICD-10-CM | POA: Diagnosis not present

## 2016-04-08 DIAGNOSIS — R0989 Other specified symptoms and signs involving the circulatory and respiratory systems: Secondary | ICD-10-CM

## 2016-04-08 DIAGNOSIS — Z720 Tobacco use: Secondary | ICD-10-CM | POA: Insufficient documentation

## 2016-04-08 DIAGNOSIS — I771 Stricture of artery: Secondary | ICD-10-CM | POA: Insufficient documentation

## 2016-04-08 DIAGNOSIS — Z823 Family history of stroke: Secondary | ICD-10-CM | POA: Insufficient documentation

## 2016-04-08 DIAGNOSIS — I10 Essential (primary) hypertension: Secondary | ICD-10-CM | POA: Diagnosis present

## 2016-04-08 DIAGNOSIS — Z9852 Vasectomy status: Secondary | ICD-10-CM | POA: Diagnosis not present

## 2016-04-08 DIAGNOSIS — I454 Nonspecific intraventricular block: Secondary | ICD-10-CM | POA: Insufficient documentation

## 2016-04-08 DIAGNOSIS — F419 Anxiety disorder, unspecified: Secondary | ICD-10-CM | POA: Insufficient documentation

## 2016-04-08 DIAGNOSIS — Z87442 Personal history of urinary calculi: Secondary | ICD-10-CM | POA: Insufficient documentation

## 2016-04-08 DIAGNOSIS — Z8249 Family history of ischemic heart disease and other diseases of the circulatory system: Secondary | ICD-10-CM

## 2016-04-08 DIAGNOSIS — R7303 Prediabetes: Secondary | ICD-10-CM | POA: Diagnosis not present

## 2016-04-08 DIAGNOSIS — I700 Atherosclerosis of aorta: Secondary | ICD-10-CM | POA: Insufficient documentation

## 2016-04-08 HISTORY — DX: Personal history of urinary calculi: Z87.442

## 2016-04-08 LAB — BASIC METABOLIC PANEL
Anion gap: 8 (ref 5–15)
BUN: 17 mg/dL (ref 6–20)
CALCIUM: 9.3 mg/dL (ref 8.9–10.3)
CO2: 26 mmol/L (ref 22–32)
Chloride: 98 mmol/L — ABNORMAL LOW (ref 101–111)
Creatinine, Ser: 1.14 mg/dL (ref 0.61–1.24)
GFR calc Af Amer: >60 mL/min (ref >60)
GFR calc non Af Amer: >60 mL/min (ref >60)
GLUCOSE: 107 mg/dL — AB (ref 65–99)
Potassium: 3.7 mmol/L (ref 3.5–5.1)
Sodium: 132 mmol/L — ABNORMAL LOW (ref 135–145)

## 2016-04-08 LAB — TROPONIN I: Troponin I: <0.03 ng/mL (ref <0.03)

## 2016-04-08 LAB — CBC
HCT: 40.2 % (ref 39.0–52.0)
HEMOGLOBIN: 13.8 g/dL (ref 13.0–17.0)
MCH: 30.7 pg (ref 26.0–34.0)
MCHC: 34.3 g/dL (ref 30.0–36.0)
MCV: 89.3 fL (ref 78.0–100.0)
PLATELETS: 275 10*3/uL (ref 150–400)
RBC: 4.5 MIL/uL (ref 4.22–5.81)
RDW: 12.1 % (ref 11.5–15.5)
WBC: 7.3 10*3/uL (ref 4.0–10.5)

## 2016-04-08 LAB — I-STAT TROPONIN, ED: TROPONIN I, POC: 0 ng/mL (ref 0.00–0.08)

## 2016-04-08 LAB — LIPID PANEL
CHOL/HDL RATIO: 2.5 ratio
CHOLESTEROL: 157 mg/dL (ref 0–200)
HDL: 62 mg/dL (ref >40)
LDL Cholesterol: 70 mg/dL (ref 0–99)
Triglycerides: 125 mg/dL (ref <150)
VLDL: 25 mg/dL (ref 0–40)

## 2016-04-08 LAB — PROTIME-INR
INR: 0.95
PROTHROMBIN TIME: 12.7 s (ref 11.4–15.2)

## 2016-04-08 MED ORDER — METOPROLOL SUCCINATE ER 50 MG PO TB24
50.0000 mg | ORAL_TABLET | Freq: Every day | ORAL | Status: DC
  Administered 2016-04-09: 50 mg via ORAL
  Filled 2016-04-08 (×2): qty 1

## 2016-04-08 MED ORDER — ENOXAPARIN SODIUM 40 MG/0.4ML ~~LOC~~ SOLN
40.0000 mg | SUBCUTANEOUS | Status: DC
  Administered 2016-04-08: 40 mg via SUBCUTANEOUS
  Filled 2016-04-08: qty 0.4

## 2016-04-08 MED ORDER — MORPHINE SULFATE (PF) 4 MG/ML IV SOLN: 1.0000 mg | INTRAVENOUS | Status: DC | PRN

## 2016-04-08 MED ORDER — SODIUM CHLORIDE 0.9 % IV SOLN
INTRAVENOUS | Status: DC
  Administered 2016-04-08 – 2016-04-09 (×2): via INTRAVENOUS

## 2016-04-08 MED ORDER — SODIUM CHLORIDE 0.9 % WEIGHT BASED INFUSION: 1.0000 mL/kg/h | INTRAVENOUS | Status: DC

## 2016-04-08 MED ORDER — SODIUM CHLORIDE 0.9 % IV SOLN: 250.0000 mL | INTRAVENOUS | Status: DC | PRN

## 2016-04-08 MED ORDER — METOPROLOL SUCCINATE ER 50 MG PO TB24: 50.0000 mg | ORAL_TABLET | Freq: Every day | ORAL | Status: DC

## 2016-04-08 MED ORDER — ROSUVASTATIN CALCIUM 10 MG PO TABS
10.0000 mg | ORAL_TABLET | Freq: Every day | ORAL | Status: DC
  Administered 2016-04-09: 09:00:00 10 mg via ORAL
  Filled 2016-04-08 (×2): qty 1

## 2016-04-08 MED ORDER — ANGIOPLASTY BOOK
Freq: Once | Status: AC
  Administered 2016-04-08: 18:00:00
  Filled 2016-04-08: qty 1

## 2016-04-08 MED ORDER — HYDROCHLOROTHIAZIDE 25 MG PO TABS
25.0000 mg | ORAL_TABLET | Freq: Every day | ORAL | Status: DC
  Administered 2016-04-09: 09:00:00 25 mg via ORAL
  Filled 2016-04-08 (×2): qty 1

## 2016-04-08 MED ORDER — BENAZEPRIL-HYDROCHLOROTHIAZIDE 20-25 MG PO TABS: 1.0000 | ORAL_TABLET | Freq: Every day | ORAL | Status: DC

## 2016-04-08 MED ORDER — GI COCKTAIL ~~LOC~~
30.0000 mL | Freq: Four times a day (QID) | ORAL | Status: DC | PRN
Start: 2016-04-08 — End: 2016-04-09

## 2016-04-08 MED ORDER — SODIUM CHLORIDE 0.9 % WEIGHT BASED INFUSION: 3.0000 mL/kg/h | INTRAVENOUS | Status: DC

## 2016-04-08 MED ORDER — ASPIRIN EC 81 MG PO TBEC
81.0000 mg | DELAYED_RELEASE_TABLET | Freq: Every day | ORAL | Status: DC
  Filled 2016-04-08 (×2): qty 1

## 2016-04-08 MED ORDER — SODIUM CHLORIDE 0.9% FLUSH
3.0000 mL | Freq: Two times a day (BID) | INTRAVENOUS | Status: DC
  Administered 2016-04-08: 3 mL via INTRAVENOUS

## 2016-04-08 MED ORDER — ONDANSETRON HCL 4 MG/2ML IJ SOLN: 4.0000 mg | Freq: Four times a day (QID) | INTRAMUSCULAR | Status: DC | PRN

## 2016-04-08 MED ORDER — BENAZEPRIL HCL 20 MG PO TABS
20.0000 mg | ORAL_TABLET | Freq: Every day | ORAL | Status: DC
  Administered 2016-04-09: 09:00:00 20 mg via ORAL
  Filled 2016-04-08 (×2): qty 1

## 2016-04-08 MED ORDER — SODIUM CHLORIDE 0.9% FLUSH: 3.0000 mL | INTRAVENOUS | Status: DC | PRN

## 2016-04-08 MED ORDER — ACETAMINOPHEN 325 MG PO TABS: 650.0000 mg | ORAL_TABLET | ORAL | Status: DC | PRN

## 2016-04-08 MED ORDER — ASPIRIN 81 MG PO CHEW
81.0000 mg | CHEWABLE_TABLET | ORAL | Status: AC
  Administered 2016-04-09: 06:00:00 81 mg via ORAL
  Filled 2016-04-08: qty 1

## 2016-04-08 NOTE — H&P (Signed)
History and Physical    Devin Stanton [id number] DOB: December 19, 1960 DOA: 04/08/2016   PCP: Eliezer Lofts, MD   Patient coming from:  Home  Chief Complaint: Chest Pain   HPI: Devin Stanton is a 55 y.o. male, husband of ER physician at [facility]  who presented to the ED with intermittent, 3 week history of substernal chest pain, with radiation to the left arm and jaw, intermittent numbness 5/10 in nature. Pain notworsened with deep inspiration, movement or exertion. Till presentation to the ED, he had about no further episodes. Patient took ASA 324 mg prior to arrival without relief. He did not take any NTG relief. Denies any dizziness or falls. No syncope or presyncope. Denies any  dyspnea or cough. Denies interscapular pain. Denies any fever or chills. Denies any nausea, vomiting or abdominal pain. Appetite is normal and eats salt rich foods. Denies any leg swelling or calf pain. Denies any headaches or vision changes. Denies any seizures. No confusion reported. He was only once  seen by a cardiologist about 4 years ago due to strong family history of heart disease, with unremarkable workup  Never had a catheterization. No recent long distance trips. Denies any new stressors. No new meds. Not on hormonal therapy.  No new herbal supplements.No tobaco. No ETOH or recreational drugs.  ED Course:  BP 144/97   Pulse 67   Resp 14   SpO2 99%    creatinine 1.14 glucose 107  EKG unremarkable for acute coronary syndrome. troponin 0  white count 7.3 hemoglobin 13.8 platelets 275  chest x-ray negative for acute disease  Review of Systems: As per HPI otherwise 10 point review of systems negative.    Past Medical History:  Diagnosis Date  . Backache, unspecified   . ED (erectile dysfunction)   . Hyperlipidemia    controlled medically  . Nephrolithiasis    1995  . Unspecified essential hypertension    controlled  . Unspecified personal history presenting hazards to health     Past  Surgical History:  Procedure Laterality Date  . FINGER SURGERY    . TONSILLECTOMY    . VASECTOMY      Social History Social History   Social History  . Marital status: Married    Spouse name: N/A  . Number of children: 3  . Years of education: 54   Occupational History  . Designer, fashion/clothing Methods   Social History Main Topics  . Smoking status: Never Smoker  . Smokeless tobacco: Current User  . Alcohol use 2.4 oz/week    4 Standard drinks or equivalent per week     Comment: wine or scotch  . Drug use: No  . Sexual activity: Yes    Partners: Female   Other Topics Concern  . 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 2nd marriage- '06-08, 3rd marriage 12/10   1 son- '59,step son '91, step daughter '93.    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        No Known Allergies  Family History  Problem Relation Age of Onset  . Parkinsonism Mother   . Macular degeneration Mother   . Leukemia Father   . Coronary artery disease Father   . Heart disease Brother 62    CABG x 5  . Heart disease Brother   . Stroke Brother 49  .  Colon cancer Neg Hx   . Esophageal cancer Neg Hx   . Stomach cancer Neg Hx       Prior to Admission medications   Medication Sig Start Date End Date Taking? Authorizing Provider  aspirin 81 MG tablet Take 81 mg by mouth daily.   Yes Historical Provider, MD  benazepril-hydrochlorthiazide (LOTENSIN HCT) 20-25 MG tablet TAKE 1 TABLET BY MOUTH ONCE DAILY 03/22/16  Yes Amy E Bedsole, MD  fluticasone (CUTIVATE) 0.005 % ointment Apply 1 application topically 2 (two) times daily. 09/13/14  Yes Amy Cletis [place], MD  metoprolol succinate (TOPROL-XL) 50 MG 24 hr tablet Take 1 tablet (50 mg total) by mouth daily. 04/05/16  Yes Amy Cletis [place], MD  rosuvastatin (CRESTOR) 10 MG tablet Take 1 tablet (10 mg total) by mouth daily. 03/22/16  Yes Jinny Sanders,  MD    Physical Exam:    Vitals:   04/08/16 1215 04/08/16 1300 04/08/16 1330 04/08/16 1331  BP: 147/95   144/97  Pulse: 63 64 65 67  Resp: 13 16 17 14   SpO2: 98% 98% 98% 99%       Constitutional: NAD, calm, comfortable Vitals:   04/08/16 1215 04/08/16 1300 04/08/16 1330 04/08/16 1331  BP: 147/95   144/97  Pulse: 63 64 65 67  Resp: 13 16 17 14   SpO2: 98% 98% 98% 99%   Eyes: PERRL, lids and conjunctivae normal ENMT: Mucous membranes are moist. Posterior pharynx clear of any exudate or lesions.Normal dentition.  Neck: normal, supple, no masses, no thyromegaly Respiratory: clear to auscultation bilaterally, no wheezing, no crackles. Normal respiratory effort. No accessory muscle use.  Cardiovascular: Regular rate and rhythm, no murmurs / rubs / gallops. No extremity edema. 2+ pedal pulses. No carotid bruits.  Abdomen: no tenderness, no masses palpated. No hepatosplenomegaly. Bowel sounds positive.  Musculoskeletal: no clubbing / cyanosis. No joint deformity upper and lower extremities. Good ROM, no contractures. Normal muscle tone.  Skin: no rashes, lesions, ulcers.  Neurologic: CN 2-12 grossly intact. Sensation intact, DTR normal. Strength 5/5 in all 4.  Psychiatric: Normal judgment and insight. Alert and oriented x 3. Normal mood.     Labs on Admission: I have personally reviewed following labs and imaging studies  CBC:  Recent Labs Lab 04/08/16 1128  WBC 7.3  HGB 13.8  HCT 40.2  MCV 89.3  PLT 123XX123    Basic Metabolic Panel:  Recent Labs Lab 04/08/16 1128  NA 132*  K 3.7  CL 98*  CO2 26  GLUCOSE 107*  BUN 17  CREATININE 1.14  CALCIUM 9.3    GFR: Estimated Creatinine Clearance: 69.7 mL/min (by C-G formula based on SCr of 1.14 mg/dL).  Liver Function Tests: No results for input(s): AST, ALT, ALKPHOS, BILITOT, PROT, ALBUMIN in the last 168 hours. No results for input(s): LIPASE, AMYLASE in the last 168 hours. No results for input(s): AMMONIA in the  last 168 hours.  Coagulation Profile: No results for input(s): INR, PROTIME in the last 168 hours.  Cardiac Enzymes: No results for input(s): CKTOTAL, CKMB, CKMBINDEX, TROPONINI in the last 168 hours.  BNP (last 3 results) No results for input(s): PROBNP in the last 8760 hours.  HbA1C: No results for input(s): HGBA1C in the last 72 hours.  CBG: No results for input(s): GLUCAP in the last 168 hours.  Lipid Profile: No results for input(s): CHOL, HDL, LDLCALC, TRIG, CHOLHDL, LDLDIRECT in the last 72 hours.  Thyroid Function Tests: No results for input(s): TSH, T4TOTAL, FREET4, T3FREE,  THYROIDAB in the last 72 hours.  Anemia Panel: No results for input(s): VITAMINB12, FOLATE, FERRITIN, TIBC, IRON, RETICCTPCT in the last 72 hours.  Urine analysis:    Component Value Date/Time   COLORURINE LT. YELLOW 01/02/2011 1520   APPEARANCEUR CLEAR 01/02/2011 1520   LABSPEC 1.020 01/02/2011 1520   PHURINE 5.5 01/02/2011 1520   GLUCOSEU NEGATIVE 01/02/2011 1520   HGBUR NEGATIVE 01/02/2011 1520   BILIRUBINUR NEGATIVE 01/02/2011 1520   KETONESUR NEGATIVE 01/02/2011 1520   UROBILINOGEN 0.2 01/02/2011 1520   NITRITE NEGATIVE 01/02/2011 1520   LEUKOCYTESUR NEGATIVE 01/02/2011 1520    Sepsis Labs: @LABRCNTIP (procalcitonin:4,lacticidven:4) )No results found for this or any previous visit (from the past 240 hour(s)).   Radiological Exams on Admission: Dg Chest 2 View  Result Date: 04/08/2016 CLINICAL DATA:  Left-sided chest pain over the last 3 weeks. Pain radiates down the left arm. EXAM: CHEST  2 VIEW COMPARISON:  None. FINDINGS: Heart size is normal. Mediastinal shadows are normal. The lungs are clear. No bronchial thickening. No infiltrate, mass, effusion or collapse. Pulmonary vascularity is normal. No bony abnormality. IMPRESSION: Normal chest Electronically Signed   By: Nelson Chimes M.D.   On: 04/08/2016 12:43    EKG: Independently reviewed.  Assessment/Plan Active Problems:    Chest pain   Hyperlipidemia   Essential hypertension, benign   Family history of premature CAD   Prediabetes    Chest pain syndrome, cardiac versus musculoskeletal vs anxiety . Strong family history of heart disease. HEART score 4 . Troponin 0 , EKG without evidence of ACS. CP unrelieved by aspirin. Did not have any new events in the ED, not receiving NTG  CXR unrevealing. LAst 2 D echo in 2013 with Gr1 DD and nl LVF with EF 60  and stress test negative for cardiac findings   Admit to Telemetry/ Observation Chest pain order set Cycle troponins EKG    ASA, O2 and NTG as needed Statins  GI cocktail Check Lipid panel  Hb A1C Consult to Cards.  Xanax for anxiety   Hypertension BP 144/97   Pulse 67   Resp 14   SpO2 99%  Controlled Continue home anti-hypertensive medications   Hyperlipidemia Continue home statins     DVT prophylaxis: Lovenox  Code Status:   Full   Family Communication:  Discussed with patient Disposition Plan: Expect patient to be discharged to home after condition improves Consults called:    Cards  Admission status:Tele  Obs  Daphnie Venturini E, PA-C [facility]   04/08/2016, 1:45 PM

## 2016-04-08 NOTE — ED Notes (Signed)
Dr. Laverta Baltimore MD at bedside.

## 2016-04-08 NOTE — ED Notes (Signed)
Patient transported to X-ray 

## 2016-04-08 NOTE — Consult Note (Signed)
Cardiology Consult    Patient ID: Devin Stanton MRN: [id number], DOB/AGE: 56-05-1961   Admit date: 04/08/2016 Date of Consult: 04/08/2016  Primary Physician: Eliezer Lofts, MD Primary Cardiologist: Leeanne Deed in the past) Requesting Provider: Dr. Laverta Baltimore Reason for Consultation: Chest pain  Patient Profile    55 yo male with PMH of HTN, HLD who presented to the [facility] ED with ongoing reports of intermittent chest pain.   Past Medical History   Past Medical History:  Diagnosis Date  . Backache, unspecified   . ED (erectile dysfunction)   . Hyperlipidemia    controlled medically  . Nephrolithiasis    1995  . Unspecified essential hypertension    controlled  . Unspecified personal history presenting hazards to health     Past Surgical History:  Procedure Laterality Date  . FINGER SURGERY    . TONSILLECTOMY    . VASECTOMY       Allergies  No Known Allergies  History of Present Illness    Devin Stanton is a 55 yo male with PMH of HTN and HLD who saw Dr. Mare Ferrari back in 2013. He had a normal exercise stress test and echo that showed normal EF with G1DD at that time. Reports he has been following with his PCP since that time. 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 Reports over the past couple of weeks he has had intermittent episodes of left sided chest pressure, with radiation into the left jaw and down into the left arm. Episodes seem to present with exertion and linger for period of time. Eventually subside with rest, but they seem to be coming more often and lasting for longer periods of time. Reports a strong family hx of father with "heart surgery" in his 40s. Brothers, one with 5v CABG at age 1, and the other with AAA repair and cardiac tamponade at age 64.   Today reports he had another episode, took 324 ASA with minimal relief. Has associated dyspnea, and diaphoresis. In the ED his labs showed stable electrolytes, trop neg x2, stable Hgb, Lipid panel  with good control. EKG with SR, no acute ST/T wave abnormalities. He was admitted to Internal Medicine for further work up.   Inpatient Medications    . aspirin EC  81 mg Oral Daily  . benazepril  20 mg Oral Daily   And  . hydrochlorothiazide  25 mg Oral Daily  . enoxaparin (LOVENOX) injection  40 mg Subcutaneous Q24H  . [START ON 04/09/2016] metoprolol succinate  50 mg Oral Daily  . rosuvastatin  10 mg Oral Daily    Family History    Family History  Problem Relation Age of Onset  . Parkinsonism Mother   . Macular degeneration Mother   . Leukemia Father   . Coronary artery disease Father   . Heart disease Brother 67    CABG x 5  . Heart disease Brother   . Stroke Brother 31  . Colon cancer Neg Hx   . Esophageal cancer Neg Hx   . Stomach cancer Neg Hx     Social History    Social History   Social History  . Marital status: Married    Spouse name: N/A  . Number of children: 3  . Years of education: 102   Occupational History  . Designer, fashion/clothing Methods   Social History Main Topics  . Smoking status: Never Smoker  . Smokeless tobacco: Current User  . Alcohol use 2.4 oz/week  4 Standard drinks or equivalent per week     Comment: wine or scotch  . Drug use: No  . Sexual activity: Yes    Partners: Female   Other Topics Concern  . 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 2nd marriage- '06-08, 3rd marriage 12/10   1 son- '6,step son '91, step daughter '93.    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        Review of Systems    General:  No chills, fever, night sweats or weight changes.  Cardiovascular: See HPI Dermatological: No rash, lesions/masses Respiratory: No cough, dyspnea Urologic: No hematuria, dysuria Abdominal:   No nausea, vomiting, diarrhea, bright red blood per rectum, melena, or hematemesis Neurologic:  No visual changes,  wkns, changes in mental status. All other systems reviewed and are otherwise negative except as noted above.  Physical Exam    Blood pressure (!) 145/103, pulse 74, temperature 98 F (36.7 C), temperature source Oral, resp. rate 19, SpO2 94 %.  General: Pleasant, NAD Psych: Normal affect. Neuro: Alert and oriented X 3. Moves all extremities spontaneously. HEENT: Normal  Neck: Supple without bruits or JVD. Lungs:  Resp regular and unlabored, CTA. Heart: RRR no s3, s4, or murmurs. Abdomen: Soft, non-tender, non-distended, BS + x 4.  Extremities: No clubbing, cyanosis or edema. DP/PT/Radials 2+ and equal bilaterally.  Labs    Troponin [facility] of Care Test)  Recent Labs  04/08/16 1153  TROPIPOC 0.00    Recent Labs  04/08/16 1355  TROPONINI <0.03   Lab Results  Component Value Date   WBC 7.3 04/08/2016   HGB 13.8 04/08/2016   HCT 40.2 04/08/2016   MCV 89.3 04/08/2016   PLT 275 04/08/2016    Recent Labs Lab 04/08/16 1128  NA 132*  K 3.7  CL 98*  CO2 26  BUN 17  CREATININE 1.14  CALCIUM 9.3  GLUCOSE 107*   Lab Results  Component Value Date   CHOL 157 04/08/2016   HDL 62 04/08/2016   LDLCALC 70 04/08/2016   TRIG 125 04/08/2016   No results found for: [facility]   Radiology Studies    Dg Chest 2 View  Result Date: 04/08/2016 CLINICAL DATA:  Left-sided chest pain over the last 3 weeks. Pain radiates down the left arm. EXAM: CHEST  2 VIEW COMPARISON:  None. FINDINGS: Heart size is normal. Mediastinal shadows are normal. The lungs are clear. No bronchial thickening. No infiltrate, mass, effusion or collapse. Pulmonary vascularity is normal. No bony abnormality. IMPRESSION: Normal chest Electronically Signed   By: Nelson Chimes M.D.   On: 04/08/2016 12:43    ECG & Cardiac Imaging    EKG: SR  Echo: 11/13  Study Conclusions  - Left ventricle: The cavity size was normal. Wall thickness was increased in a pattern of mild LVH. Systolic function was normal.  The estimated ejection fraction was in the range of 60% to 65%. Wall motion was normal; there were no regional wall motion abnormalities. Doppler parameters are consistent with abnormal left ventricular relaxation (grade 1 diastolic dysfunction). - Aortic valve: Trivial regurgitation. - Atrial septum: No defect or patent foramen ovale was identified.  Assessment & Plan    55 yo male with PMH of HTN, HLD who presented to the [facility] ED with ongoing reports of intermittent chest pain.  1. Unstable Angina: Reports  episodes have been coming more often and lasting longer. Currently without chest pain, trops neg x2. EKG is non-acute. Does have RF including HTN and HLD, along with very strong family hx with brother having cardiac interventions in the past couple of years. Also had calcification of aorta noted on CT scan in 2012. -- has very typical symptoms and strong family hx. Had a normal exercise stress test in 2013. In this case, seems reasonable to proceed with LHC. Will plan for LHC in the morning.  -- The patient understands that risks included but are not limited to stroke (1 in 1000), death (1 in 1000), kidney failure [usually temporary] (1 in 500), bleeding (1 in 200), allergic reaction [possibly serious] (1 in 200).   2. HTN: Well controlled.   3. HLD: Well controlled, on statin.   Devin Pall, NP-C Pager [phone] 04/08/2016, 4:09 PM   Pt seen and examined  I agree with findings as noted above by Devin Stanton. Pt with no known CAD  Did have mild plaquing on CT of aorta in 2012  Strong FHx of heart problesm  Presents with history very concerning for [place] Currently CP free  Has r/o for MI ON exam Lungs are CTA  Cardiac exam:  RRR  No S3  Ext without edema  I reviewd with pt testing options  I would favor with hx and job a LHC to define  Risks /benefits described  PT understands and agrees to proceed.  Continue meds for now.

## 2016-04-08 NOTE — ED Triage Notes (Signed)
Patient here with chest pain intermittently for greater than 1 week. Has had diaphoresis and shortness of breath with same. States that the pain radiates to neck and jaw. This am no resolution with the pain. Took 2 adult aspirin pta

## 2016-04-08 NOTE — ED Notes (Signed)
Admitting PA at bedside.

## 2016-04-08 NOTE — ED Provider Notes (Signed)
Emergency Department Provider Note   I have reviewed the triage vital signs and the nursing notes.   HISTORY  Chief Complaint Chest Pain   HPI Devin Stanton is a 55 y.o. male with past nuchal history of hyperlipidemia, HTN, and strong family history of CAD presents to the emergent department for evaluation of intermittent left shoulder discomfort with associated left jaw and left arm numbness. Patient states that symptoms have been worsening significantly over the past 3 weeks. They do not seem associated with exertion. No tenderness to palpation or movement. He has noted associated blood pressure increase that has now responded to increasing his metoprolol dose. Chest pain and numbness seem to go along with increased blood pressure per family at bedside. No fever or shaking chills. The patient notes prior stress testing done several years ago. No history of acute MI or cardiac catheterization.   Past Medical History:  Diagnosis Date  . Backache, unspecified   . ED (erectile dysfunction)   . Hyperlipidemia    controlled medically  . Nephrolithiasis    1995  . Unspecified essential hypertension    controlled  . Unspecified personal history presenting hazards to health     Patient Active Problem List   Diagnosis Date Noted  . Chest pain 04/08/2016  . Prediabetes 09/13/2014  . Family history of premature CAD 05/31/2014  . Right cervical radiculopathy 05/31/2014  . Routine health maintenance 04/09/2011  . Hyperlipidemia 04/18/2009  . ERECTILE DYSFUNCTION, MILD 10/14/2007  . Essential hypertension, benign 10/14/2007    Past Surgical History:  Procedure Laterality Date  . FINGER SURGERY    . TONSILLECTOMY    . VASECTOMY      Current Outpatient Rx  . Order #: [id number] Class: Historical Med  . Order #: [id number] Class: Normal  . Order #: [id number] Class: Normal  . Order #: [id number] Class: Normal  . Order #: [id number] Class: Normal    Allergies Review of patient's  allergies indicates no known allergies.  Family History  Problem Relation Age of Onset  . Parkinsonism Mother   . Macular degeneration Mother   . Leukemia Father   . Coronary artery disease Father   . Heart disease Brother 15    CABG x 5  . Heart disease Brother   . Stroke Brother 62  . Colon cancer Neg Hx   . Esophageal cancer Neg Hx   . Stomach cancer Neg Hx     Social History Social History  Substance Use Topics  . Smoking status: Never Smoker  . Smokeless tobacco: Current User  . Alcohol use 2.4 oz/week    4 Standard drinks or equivalent per week     Comment: wine or scotch    Review of Systems  Constitutional: No fever/chills Eyes: No visual changes. ENT: No sore throat. Cardiovascular: Denies chest pain. Respiratory: Denies shortness of breath. Gastrointestinal: No abdominal pain.  No nausea, no vomiting.  No diarrhea.  No constipation. Genitourinary: Negative for dysuria. Musculoskeletal: Negative for back pain. Skin: Negative for rash. Neurological: Negative for headaches, focal weakness or numbness.  10-point ROS otherwise negative.  ____________________________________________   PHYSICAL EXAM:  VITAL SIGNS: ED Triage Vitals  Enc Vitals Group     BP 04/08/16 1200 159/94     Pulse Rate 04/08/16 1142 62     Resp 04/08/16 1142 16     SpO2 04/08/16 1142 100 %     Pain Score 04/08/16 1126 3   Constitutional: Alert and oriented. Well appearing and in no acute distress.  Eyes: Conjunctivae are normal.  Head: Atraumatic. Nose: No congestion/rhinnorhea. Mouth/Throat: Mucous membranes are moist.  Oropharynx non-erythematous. Neck: No stridor.   Cardiovascular: Normal rate, regular rhythm. Good peripheral circulation. Grossly normal heart sounds.   Respiratory: Normal respiratory effort.  No retractions. Lungs CTAB. Gastrointestinal: Soft and nontender. No distention.  Musculoskeletal: No lower extremity tenderness nor edema. No gross deformities of  extremities. No tenderness to palpation of the chest wall.  Neurologic:  Normal speech and language. No gross focal neurologic deficits are appreciated.  Skin:  Skin is warm, dry and intact. No rash noted.  ____________________________________________   LABS (all labs ordered are listed, but only abnormal results are displayed)  Labs Reviewed  BASIC METABOLIC PANEL - Abnormal; Notable for the following:       Result Value   Sodium 132 (*)    Chloride 98 (*)    Glucose, Bld 107 (*)    All other components within normal limits  CBC  TROPONIN I  TROPONIN I  TROPONIN I  LIPID PANEL  HEMOGLOBIN A1C  I-STAT TROPOININ, ED   ____________________________________________  EKG   EKG Interpretation  Date/Time:  Monday April 08 2016 14:03:00 EDT Ventricular Rate:  64 PR Interval:  182 QRS Duration: 116 QT Interval:  419 QTC Calculation: 433 R Axis:   21 Text Interpretation:  Sinus rhythm Probable left atrial enlargement Nonspecific intraventricular conduction delay Low voltage, precordial leads Borderline T abnormalities, anterior leads No STEMI.  Similar to prior.  Confirmed by Yenifer Saccente MD, Enio Hornback [phone]) on 04/08/2016 2:10:07 PM       ____________________________________________  RADIOLOGY  Dg Chest 2 View  Result Date: 04/08/2016 CLINICAL DATA:  Left-sided chest pain over the last 3 weeks. Pain radiates down the left arm. EXAM: CHEST  2 VIEW COMPARISON:  None. FINDINGS: Heart size is normal. Mediastinal shadows are normal. The lungs are clear. No bronchial thickening. No infiltrate, mass, effusion or collapse. Pulmonary vascularity is normal. No bony abnormality. IMPRESSION: Normal chest Electronically Signed   By: Nelson Chimes M.D.   On: 04/08/2016 12:43    ____________________________________________   PROCEDURES  Procedure(s) performed:   Procedures  None ____________________________________________   INITIAL IMPRESSION / ASSESSMENT AND PLAN / ED  COURSE  Pertinent labs & imaging results that were available during my care of the patient were reviewed by me and considered in my medical decision making (see chart for details).  Patient resents to the emergency department for evaluation of intermittent chest pain over the past 3 weeks. Seems to be worsening significantly. His associated numbness in his jaw and arm with chest pain episodes. HEART score of 5 (moderate risk) with no recent chest pain evaluation. Plan to follow enzymes and CXR. With possible admission for further enzyme trending and consideration of stress testing.   01:00 PM Patient had one episode of pain while in the emergency department. It is since resolved. Labs and imaging unremarkable. Given his risk factors plan to admit for chest pain rule out. I discussed this in detail with the patient and family at bedside.   Discussed patient's case with hospitalist, Dr. Barbaraann Faster.  Recommend admission to obs, tele bed.  I will place holding orders per their request. Patient and family (if present) updated with plan. Care transferred to hospitalist service.  I reviewed all nursing notes, vitals, pertinent old records, EKGs, labs, imaging (as available).  ____________________________________________  FINAL CLINICAL IMPRESSION(S) / ED DIAGNOSES  Final diagnoses:  Chest pain, unspecified type     MEDICATIONS GIVEN  DURING THIS VISIT:  Medications  metoprolol succinate (TOPROL-XL) 24 hr tablet 50 mg (not administered)  benazepril-hydrochlorthiazide (LOTENSIN HCT) 20-25 MG per tablet 1 tablet (not administered)  rosuvastatin (CRESTOR) tablet 10 mg (not administered)  aspirin tablet 81 mg (not administered)  0.9 %  sodium chloride infusion (not administered)  morphine 4 MG/ML injection 1 mg (not administered)  gi cocktail (Maalox,Lidocaine,Donnatal) (not administered)  acetaminophen (TYLENOL) tablet 650 mg (not administered)  ondansetron (ZOFRAN) injection 4 mg (not  administered)  enoxaparin (LOVENOX) injection 40 mg (not administered)     NEW OUTPATIENT MEDICATIONS STARTED DURING THIS VISIT:  None   Note:  This document was prepared using Dragon voice recognition software and may include unintentional dictation errors.  Nanda Quinton, MD Emergency Medicine   Margette Fast, MD 04/08/16 [phone]

## 2016-04-09 ENCOUNTER — Encounter (HOSPITAL_COMMUNITY)
Admission: EM | Disposition: A | Discharge status: Home / Self Care | Payer: Self-pay | Source: Home / Self Care | Attending: Family Medicine

## 2016-04-09 ENCOUNTER — Ambulatory Visit (HOSPITAL_COMMUNITY)
Admission: RE | Admit: 2016-04-09 | Payer: Federal, State, Local not specified - PPO | Source: Ambulatory Visit | Admitting: Cardiology

## 2016-04-09 ENCOUNTER — Other Ambulatory Visit (HOSPITAL_COMMUNITY): Payer: Federal, State, Local not specified - PPO

## 2016-04-09 ENCOUNTER — Encounter (HOSPITAL_COMMUNITY): Payer: Self-pay | Admitting: Cardiology

## 2016-04-09 DIAGNOSIS — R0989 Other specified symptoms and signs involving the circulatory and respiratory systems: Secondary | ICD-10-CM

## 2016-04-09 DIAGNOSIS — I10 Essential (primary) hypertension: Secondary | ICD-10-CM | POA: Diagnosis not present

## 2016-04-09 DIAGNOSIS — Z8249 Family history of ischemic heart disease and other diseases of the circulatory system: Secondary | ICD-10-CM

## 2016-04-09 DIAGNOSIS — E785 Hyperlipidemia, unspecified: Secondary | ICD-10-CM

## 2016-04-09 DIAGNOSIS — I200 Unstable angina: Secondary | ICD-10-CM | POA: Diagnosis not present

## 2016-04-09 DIAGNOSIS — I4891 Unspecified atrial fibrillation: Secondary | ICD-10-CM | POA: Diagnosis not present

## 2016-04-09 DIAGNOSIS — Z87442 Personal history of urinary calculi: Secondary | ICD-10-CM | POA: Diagnosis not present

## 2016-04-09 DIAGNOSIS — I771 Stricture of artery: Secondary | ICD-10-CM | POA: Diagnosis not present

## 2016-04-09 DIAGNOSIS — I739 Peripheral vascular disease, unspecified: Secondary | ICD-10-CM

## 2016-04-09 HISTORY — PX: PERIPHERAL VASCULAR CATHETERIZATION: SHX172C

## 2016-04-09 HISTORY — PX: CARDIAC CATHETERIZATION: SHX172

## 2016-04-09 LAB — HEMOGLOBIN A1C
HEMOGLOBIN A1C: 5.7 % — AB (ref 4.8–5.6)
MEAN PLASMA GLUCOSE: 117 mg/dL

## 2016-04-09 SURGERY — LEFT HEART CATH AND CORONARY ANGIOGRAPHY

## 2016-04-09 MED ORDER — HEPARIN SODIUM (PORCINE) 1000 UNIT/ML IJ SOLN
INTRAMUSCULAR | Status: AC
  Filled 2016-04-09: qty 1

## 2016-04-09 MED ORDER — VERAPAMIL HCL 2.5 MG/ML IV SOLN
INTRAVENOUS | Status: AC
  Filled 2016-04-09: qty 2

## 2016-04-09 MED ORDER — LIDOCAINE HCL (PF) 1 % IJ SOLN
INTRAMUSCULAR | Status: DC | PRN
  Administered 2016-04-09: 2 mL via INTRADERMAL

## 2016-04-09 MED ORDER — SODIUM CHLORIDE 0.9% FLUSH: 3.0000 mL | INTRAVENOUS | Status: DC | PRN

## 2016-04-09 MED ORDER — FENTANYL CITRATE (PF) 100 MCG/2ML IJ SOLN
INTRAMUSCULAR | Status: DC | PRN
  Administered 2016-04-09: 50 ug via INTRAVENOUS

## 2016-04-09 MED ORDER — LIDOCAINE HCL (PF) 1 % IJ SOLN
INTRAMUSCULAR | Status: AC
  Filled 2016-04-09: qty 30

## 2016-04-09 MED ORDER — SODIUM CHLORIDE 0.9 % WEIGHT BASED INFUSION: 3.0000 mL/kg/h | INTRAVENOUS | Status: AC

## 2016-04-09 MED ORDER — HEPARIN (PORCINE) IN NACL 2-0.9 UNIT/ML-% IJ SOLN
INTRAMUSCULAR | Status: AC
  Filled 2016-04-09: qty 1000

## 2016-04-09 MED ORDER — HEPARIN (PORCINE) IN NACL 2-0.9 UNIT/ML-% IJ SOLN
INTRAMUSCULAR | Status: DC | PRN
  Administered 2016-04-09: 1000 mL via INTRA_ARTERIAL

## 2016-04-09 MED ORDER — SODIUM CHLORIDE 0.9 % IV SOLN: 250.0000 mL | INTRAVENOUS | Status: DC | PRN

## 2016-04-09 MED ORDER — MIDAZOLAM HCL 2 MG/2ML IJ SOLN
INTRAMUSCULAR | Status: DC | PRN
  Administered 2016-04-09: 2 mg via INTRAVENOUS

## 2016-04-09 MED ORDER — MIDAZOLAM HCL 2 MG/2ML IJ SOLN
INTRAMUSCULAR | Status: AC
  Filled 2016-04-09: qty 2

## 2016-04-09 MED ORDER — HEPARIN SODIUM (PORCINE) 1000 UNIT/ML IJ SOLN
INTRAMUSCULAR | Status: DC | PRN
  Administered 2016-04-09: 4000 [IU] via INTRAVENOUS

## 2016-04-09 MED ORDER — AMLODIPINE BESYLATE 2.5 MG PO TABS: 2.5000 mg | ORAL_TABLET | Freq: Every day | ORAL | 0 refills | Status: DC

## 2016-04-09 MED ORDER — VERAPAMIL HCL 2.5 MG/ML IV SOLN
INTRAVENOUS | Status: DC | PRN
  Administered 2016-04-09: 08:00:00 via INTRA_ARTERIAL

## 2016-04-09 MED ORDER — IOPAMIDOL (ISOVUE-370) INJECTION 76%
INTRAVENOUS | Status: DC | PRN
  Administered 2016-04-09: 130 mL via INTRA_ARTERIAL

## 2016-04-09 MED ORDER — SODIUM CHLORIDE 0.9% FLUSH
3.0000 mL | Freq: Two times a day (BID) | INTRAVENOUS | Status: DC
Start: 2016-04-09 — End: 2016-04-09

## 2016-04-09 MED ORDER — FENTANYL CITRATE (PF) 100 MCG/2ML IJ SOLN
INTRAMUSCULAR | Status: AC
  Filled 2016-04-09: qty 2

## 2016-04-09 MED ORDER — IOPAMIDOL (ISOVUE-370) INJECTION 76%
INTRAVENOUS | Status: AC
  Filled 2016-04-09: qty 100

## 2016-04-09 MED ORDER — IOPAMIDOL (ISOVUE-370) INJECTION 76%
INTRAVENOUS | Status: AC
Start: 2016-04-09 — End: 2016-04-09
  Filled 2016-04-09: qty 100

## 2016-04-09 MED ORDER — AMLODIPINE BESYLATE 5 MG PO TABS: 2.5000 mg | ORAL_TABLET | Freq: Every day | ORAL | Status: DC

## 2016-04-09 SURGICAL SUPPLY — 13 items
CATH INFINITI 5 FR JL3.5 (CATHETERS) ×1 IMPLANT
CATH INFINITI 5FR ANG PIGTAIL (CATHETERS) ×1 IMPLANT
CATH INFINITI JR4 5F (CATHETERS) ×1 IMPLANT
CATH OPTITORQUE TIG 4.0 5F (CATHETERS) ×1 IMPLANT
DEVICE RAD COMP TR BAND LRG (VASCULAR PRODUCTS) ×1 IMPLANT
GLIDESHEATH SLEND A-KIT 6F 22G (SHEATH) ×1 IMPLANT
KIT HEART LEFT (KITS) ×2 IMPLANT
PACK CARDIAC CATHETERIZATION (CUSTOM PROCEDURE TRAY) ×2 IMPLANT
SYR MEDRAD MARK V 150ML (SYRINGE) ×2 IMPLANT
TRANSDUCER W/STOPCOCK (MISCELLANEOUS) ×2 IMPLANT
TUBING CIL FLEX 10 FLL-RA (TUBING) ×2 IMPLANT
WIRE HI TORQ VERSACORE-J 145CM (WIRE) ×1 IMPLANT
WIRE SAFE-T 1.5MM-J .035X260CM (WIRE) ×1 IMPLANT

## 2016-04-09 NOTE — Discharge Summary (Addendum)
Physician Discharge Summary  Devin Stanton [id number] DOB: [birth year] DOA: 04/08/2016  PCP: Devin Lofts, MD  Admit date: 04/08/2016 Discharge date: 04/09/2016   Recommendations for Outpatient Follow-Up:   1. Outpatient evaluation with GI if pain continues 2.  Per cards rec: add low dose amlodipine 2.5 mg  Back off of beta blocker if able as outpateint   Discharge Diagnosis:   Principal Problem:   Unstable angina ([place]) Active Problems:   Hyperlipidemia   Essential hypertension, benign   Family history of premature CAD   Prediabetes   Chest pain   Labile hypertension   Discharge disposition:  Home:  Discharge Condition: Improved.  Diet recommendation: Low sodium, heart healthy  Wound care: None.   History of Present Illness:   Devin Stanton is a 55 y.o. 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 Pain notworsened with deep inspiration, movement or exertion. Till presentation to the ED, he had about no further episodes. Patient took ASA 324 mg prior to arrival without relief. He did not take any NTG relief. Denies any dizziness or falls. No syncope or presyncope. Denies any  dyspnea or cough. Denies interscapular pain. Denies any fever or chills. Denies any nausea, vomiting or abdominal pain. Appetite is normal and eats salt rich foods. Denies any leg swelling or calf pain. Denies any headaches or vision changes. Denies any seizures. No confusion reported. He was only once  seen by a cardiologist about 4 years ago due to strong family history of heart disease, with unremarkable workup  Never had a catheterization. No recent long distance trips. Denies any new stressors. No new meds. Not on hormonal therapy.  No new herbal supplements.No tobaco. No ETOH or recreational drugs.    Hospital Course by  Problem:   Chest pain  S/p cath: Angiographically minimal if any CAD. Moderate to significant tortuosity noted throughout.  The left ventricular systolic function is normal.  LV end diastolic pressure is normal.  Normal dominant aorta with widely patent renal arteries.  The left ventricular ejection fraction is 55-65% by visual estimate.   Patient has widely patent coronary arteries, but are quite tortuous.  -starting PPI OTC--- GI eval if continues -added low dose norvasc   Medical Consultants:    cards   Discharge Exam:   Vitals:   04/09/16 1100 04/09/16 1103  BP: 128/74 128/74  Pulse: (!) 59 64  Resp: 14 15  Temp:  98.1 F (36.7 C)   Vitals:   04/09/16 0930 04/09/16 1000 04/09/16 1100 04/09/16 1103  BP: 130/89 (!) 150/90 128/74 128/74  Pulse: 69 70 (!) 59 64  Resp: 15 19 14 15   Temp:    98.1 F (36.7 C)  TempSrc:    Oral  SpO2: 96% 97% 99% 99%  Weight:        Gen:  NAD    The results of significant diagnostics from this hospitalization (including imaging, microbiology, ancillary and laboratory) are listed below for reference.     Procedures and Diagnostic Studies:   Dg Chest 2 View  Result Date: 04/08/2016 CLINICAL DATA:  Left-sided chest pain over the last 3 weeks. Pain radiates down the left arm. EXAM: CHEST  2 VIEW COMPARISON:  None. FINDINGS: Heart size is normal. Mediastinal shadows are normal. The lungs are clear. No bronchial thickening. No infiltrate, mass,  effusion or collapse. Pulmonary vascularity is normal. No bony abnormality. IMPRESSION: Normal chest Electronically Signed   By: Nelson Chimes M.D.   On: 04/08/2016 12:43     Labs:   Basic Metabolic Panel:  Recent Labs Lab 04/08/16 1128  NA 132*  K 3.7  CL 98*  CO2 26  GLUCOSE 107*  BUN 17  CREATININE 1.14  CALCIUM 9.3   GFR Estimated Creatinine Clearance: 69.7 mL/min (by C-G formula based on SCr of 1.14 mg/dL). Liver Function Tests: No results for input(s): AST, ALT,  ALKPHOS, BILITOT, PROT, ALBUMIN in the last 168 hours. No results for input(s): LIPASE, AMYLASE in the last 168 hours. No results for input(s): AMMONIA in the last 168 hours. Coagulation profile  Recent Labs Lab 04/08/16 2200  INR 0.95    CBC:  Recent Labs Lab 04/08/16 1128  WBC 7.3  HGB 13.8  HCT 40.2  MCV 89.3  PLT 275   Cardiac Enzymes:  Recent Labs Lab 04/08/16 1355 04/08/16 1626 04/08/16 1921  TROPONINI <0.03 <0.03 <0.03   BNP: Invalid input(s): POCBNP CBG: No results for input(s): GLUCAP in the last 168 hours. D-Dimer No results for input(s): DDIMER in the last 72 hours. Hgb A1c  Recent Labs  04/08/16 1359  HGBA1C 5.7*   Lipid Profile  Recent Labs  04/08/16 1359  CHOL 157  HDL 62  LDLCALC 70  TRIG 125  CHOLHDL 2.5   Thyroid function studies No results for input(s): TSH, T4TOTAL, T3FREE, THYROIDAB in the last 72 hours.  Invalid input(s): FREET3 Anemia work up No results for input(s): VITAMINB12, FOLATE, FERRITIN, TIBC, IRON, RETICCTPCT in the last 72 hours. Microbiology No results found for this or any previous visit (from the past 240 hour(s)).   Discharge Instructions:   Discharge Instructions    Diet - low sodium heart healthy    Complete by:  As directed    Discharge instructions    Complete by:  As directed    Would try OTC PPI for possible acid reflux symptoms Keep appointment with surgeon   Increase activity slowly    Complete by:  As directed        Medication List    TAKE these medications   amLODipine 2.5 MG tablet Commonly known as:  NORVASC Take 1 tablet (2.5 mg total) by mouth daily.   aspirin 81 MG tablet Take 81 mg by mouth daily.   benazepril-hydrochlorthiazide 20-25 MG tablet Commonly known as:  LOTENSIN HCT TAKE 1 TABLET BY MOUTH ONCE DAILY   fluticasone 0.005 % ointment Commonly known as:  CUTIVATE Apply 1 application topically 2 (two) times daily.   metoprolol succinate 50 MG 24 hr  tablet Commonly known as:  TOPROL-XL Take 1 tablet (50 mg total) by mouth daily.   rosuvastatin 10 MG tablet Commonly known as:  CRESTOR Take 1 tablet (10 mg total) by mouth daily.      Follow-up Information    Devin Lofts, MD Follow up in 1 week(s).   Specialty:  Family Medicine Contact information: [place]  [id number] [phone]            Time coordinating discharge: 35 min  Signed:  Rhaelyn Giron Alison Stalling   [facility] 04/09/2016, 12:57 PM

## 2016-04-09 NOTE — Progress Notes (Signed)
Reviewed cath.  Mild plaquing  Tortuous vessels consistent with HTN  Renal arteries OK  BP has been labile at times  Diastolic has been up some (upper 80s) Review of meds  Consider adding low dose amlodipine 2.5 mg  May be able to back off of b blocker   Keep on statin  LDL good  F?U BP as outpt I have cancelled echo  LVEF was normal at cath

## 2016-04-09 NOTE — Progress Notes (Signed)
TR BAND REMOVAL  LOCATION:  right radial  DEFLATED PER PROTOCOL:  Yes.    TIME BAND OFF / DRESSING APPLIED:   1100   SITE UPON ARRIVAL:   Level 0  SITE AFTER BAND REMOVAL:  Level 0  CIRCULATION SENSATION AND MOVEMENT:  Within Normal Limits  Yes.    COMMENTS:

## 2016-04-09 NOTE — Interval H&P Note (Signed)
History and Physical Interval Note:  04/09/2016 7:25 AM  Devin Stanton  has presented today for surgery, with the diagnosis of unstable angina  The various methods of treatment have been discussed with the patient and family. After consideration of risks, benefits and other options for treatment, the patient has consented to  Procedure(s): Left Heart Cath and Coronary Angiography (N/A) with possible Percutaneous Coronary Intervention as a surgical intervention .  The patient's history has been reviewed, patient examined, no change in status, stable for surgery.  I have reviewed the patient's chart and labs.  Questions were answered to the patient's satisfaction.    Cath Lab Visit (complete for each Cath Lab visit)  Clinical Evaluation Leading to the Procedure:   ACS: Yes.    Non-ACS:    Anginal Classification: CCS III  Anti-ischemic medical therapy: Minimal Therapy (1 class of medications)  Non-Invasive Test Results: No non-invasive testing performed  Prior CABG: No previous CABG   Devin Stanton

## 2016-04-09 NOTE — Care Management Note (Signed)
Case Management Note  Patient Details  Name: Devin Stanton MRN: [id number] Date of Birth: 1961-03-22  Subjective/Objective:     S/p abd aortogram and left heart cath, treat medically.  NCM will cont to follow for dc needs.               Action/Plan:   Expected Discharge Date:                  Expected Discharge Plan:  Home/Self Care  In-House Referral:     Discharge planning Services  CM Consult  Post Acute Care Choice:    Choice offered to:     DME Arranged:    DME Agency:     HH Arranged:    HH Agency:     Status of Service:  Completed, signed off  If discussed at [employer] of Stay Meetings, dates discussed:    Additional Comments:  Zenon Mayo, RN 04/09/2016, 9:08 AM

## 2016-04-09 NOTE — H&P (View-Only) (Signed)
Cardiology Consult    Patient ID: Devin Stanton MRN: [id number], DOB/AGE: April 29, 1961   Admit date: 04/08/2016 Date of Consult: 04/08/2016  Primary Physician: Eliezer Lofts, MD Primary Cardiologist: Leeanne Deed in the past) Requesting Provider: Dr. Laverta Baltimore Reason for Consultation: Chest pain  Patient Profile    55 yo male with PMH of HTN, HLD who presented to the [facility] ED with ongoing reports of intermittent chest pain.   Past Medical History   Past Medical History:  Diagnosis Date  . Backache, unspecified   . ED (erectile dysfunction)   . Hyperlipidemia    controlled medically  . Nephrolithiasis    1995  . Unspecified essential hypertension    controlled  . Unspecified personal history presenting hazards to health     Past Surgical History:  Procedure Laterality Date  . FINGER SURGERY    . TONSILLECTOMY    . VASECTOMY       Allergies  No Known Allergies  History of Present Illness    Devin Stanton is a 55 yo male with PMH of HTN and HLD who saw Dr. Mare Ferrari back in 2013. He had a normal exercise stress test and echo that showed normal EF with G1DD at that time. Reports he has been following with his PCP since that time. 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 Reports over the past couple of weeks he has had intermittent episodes of left sided chest pressure, with radiation into the left jaw and down into the left arm. Episodes seem to present with exertion and linger for period of time. Eventually subside with rest, but they seem to be coming more often and lasting for longer periods of time. Reports a strong family hx of father with "heart surgery" in his 74s. Brothers, one with 5v CABG at age 83, and the other with AAA repair and cardiac tamponade at age 62.   Today reports he had another episode, took 324 ASA with minimal relief. Has associated dyspnea, and diaphoresis. In the ED his labs showed stable electrolytes, trop neg x2, stable Hgb, Lipid panel  with good control. EKG with SR, no acute ST/T wave abnormalities. He was admitted to Internal Medicine for further work up.   Inpatient Medications    . aspirin EC  81 mg Oral Daily  . benazepril  20 mg Oral Daily   And  . hydrochlorothiazide  25 mg Oral Daily  . enoxaparin (LOVENOX) injection  40 mg Subcutaneous Q24H  . [START ON 04/09/2016] metoprolol succinate  50 mg Oral Daily  . rosuvastatin  10 mg Oral Daily    Family History    Family History  Problem Relation Age of Onset  . Parkinsonism Mother   . Macular degeneration Mother   . Leukemia Father   . Coronary artery disease Father   . Heart disease Brother 27    CABG x 5  . Heart disease Brother   . Stroke Brother 57  . Colon cancer Neg Hx   . Esophageal cancer Neg Hx   . Stomach cancer Neg Hx     Social History    Social History   Social History  . Marital status: Married    Spouse name: N/A  . Number of children: 3  . Years of education: 23   Occupational History  . Designer, fashion/clothing [employer]   Social History Main Topics  . Smoking status: Never Smoker  . Smokeless tobacco: Current User  . Alcohol use 2.4 oz/week  4 Standard drinks or equivalent per week     Comment: wine or scotch  . Drug use: No  . Sexual activity: Yes    Partners: Female   Other Topics Concern  . 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 2nd marriage- '06-08, 3rd marriage 12/10   1 son- '74,step son '91, step daughter '93.    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        Review of Systems    General:  No chills, fever, night sweats or weight changes.  Cardiovascular: See HPI Dermatological: No rash, lesions/masses Respiratory: No cough, dyspnea Urologic: No hematuria, dysuria Abdominal:   No nausea, vomiting, diarrhea, bright red blood per rectum, melena, or hematemesis Neurologic:  No visual changes,  wkns, changes in mental status. All other systems reviewed and are otherwise negative except as noted above.  Physical Exam    Blood pressure (!) 145/103, pulse 74, temperature 98 F (36.7 C), temperature source Oral, resp. rate 19, SpO2 94 %.  General: Pleasant, NAD Psych: Normal affect. Neuro: Alert and oriented X 3. Moves all extremities spontaneously. HEENT: Normal  Neck: Supple without bruits or JVD. Lungs:  Resp regular and unlabored, CTA. Heart: RRR no s3, s4, or murmurs. Abdomen: Soft, non-tender, non-distended, BS + x 4.  Extremities: No clubbing, cyanosis or edema. DP/PT/Radials 2+ and equal bilaterally.  Labs    Troponin [facility] Test)  Recent Labs  04/08/16 1153  TROPIPOC 0.00    Recent Labs  04/08/16 1355  TROPONINI <0.03   Lab Results  Component Value Date   WBC 7.3 04/08/2016   HGB 13.8 04/08/2016   HCT 40.2 04/08/2016   MCV 89.3 04/08/2016   PLT 275 04/08/2016    Recent Labs Lab 04/08/16 1128  NA 132*  K 3.7  CL 98*  CO2 26  BUN 17  CREATININE 1.14  CALCIUM 9.3  GLUCOSE 107*   Lab Results  Component Value Date   CHOL 157 04/08/2016   HDL 62 04/08/2016   LDLCALC 70 04/08/2016   TRIG 125 04/08/2016   No results found for: [facility]   Radiology Studies    Dg Chest 2 View  Result Date: 04/08/2016 CLINICAL DATA:  Left-sided chest pain over the last 3 weeks. Pain radiates down the left arm. EXAM: CHEST  2 VIEW COMPARISON:  None. FINDINGS: Heart size is normal. Mediastinal shadows are normal. The lungs are clear. No bronchial thickening. No infiltrate, mass, effusion or collapse. Pulmonary vascularity is normal. No bony abnormality. IMPRESSION: Normal chest Electronically Signed   By: Nelson Chimes M.D.   On: 04/08/2016 12:43    ECG & Cardiac Imaging    EKG: SR  Echo: 11/13  Study Conclusions  - Left ventricle: The cavity size was normal. Wall thickness was increased in a pattern of mild LVH. Systolic function was normal.  The estimated ejection fraction was in the range of 60% to 65%. Wall motion was normal; there were no regional wall motion abnormalities. Doppler parameters are consistent with abnormal left ventricular relaxation (grade 1 diastolic dysfunction). - Aortic valve: Trivial regurgitation. - Atrial septum: No defect or patent foramen ovale was identified.  Assessment & Plan    55 yo male with PMH of HTN, HLD who presented to the [facility] ED with ongoing reports of intermittent chest pain.  1. Unstable Angina: Reports  episodes have been coming more often and lasting longer. Currently without chest pain, trops neg x2. EKG is non-acute. Does have RF including HTN and HLD, along with very strong family hx with brother having cardiac interventions in the past couple of years. Also had calcification of aorta noted on CT scan in 2012. -- has very typical symptoms and strong family hx. Had a normal exercise stress test in 2013. In this case, seems reasonable to proceed with LHC. Will plan for LHC in the morning.  -- The patient understands that risks included but are not limited to stroke (1 in 1000), death (1 in 1000), kidney failure [usually temporary] (1 in 500), bleeding (1 in 200), allergic reaction [possibly serious] (1 in 200).   2. HTN: Well controlled.   3. HLD: Well controlled, on statin.   Barnet Pall, NP-C Pager [phone] 04/08/2016, 4:09 PM   Pt seen and examined  I agree with findings as noted above by Eugene Garnet. Pt with no known CAD  Did have mild plaquing on CT of aorta in 2012  Strong FHx of heart problesm  Presents with history very concerning for [place] Currently CP free  Has r/o for MI ON exam Lungs are CTA  Cardiac exam:  RRR  No S3  Ext without edema  I reviewd with pt testing options  I would favor with hx and job a LHC to define  Risks /benefits described  PT understands and agrees to proceed.  Continue meds for now.

## 2016-04-22 ENCOUNTER — Telehealth: Payer: Self-pay | Admitting: *Deleted

## 2016-04-22 NOTE — Telephone Encounter (Signed)
PT brought in a form to be completed by Dr. Diona Browner. He filled out one "as an example" and said Dr. Diona Browner may sign that one or fill out the blank one attached. Please fax to [phone] when complete and let patient know by calling him [phone] when it has been faxed. Pt stated his first scheduled day back to work is Thursday. Form placed in prescription tower.

## 2016-04-22 NOTE — Telephone Encounter (Signed)
Form placed in Dr. Rometta Emery in box to complete/sign.

## 2016-04-24 NOTE — Telephone Encounter (Signed)
Forms faxed to [phone].  Left message for Abduallah that forms have been completed and faxed as requested.

## 2016-05-08 ENCOUNTER — Other Ambulatory Visit: Payer: Self-pay | Admitting: *Deleted

## 2016-05-08 ENCOUNTER — Telehealth: Payer: Self-pay | Admitting: Family Medicine

## 2016-05-08 MED ORDER — AMLODIPINE BESYLATE 2.5 MG PO TABS
2.5000 mg | ORAL_TABLET | Freq: Every day | ORAL | 5 refills | Status: DC
Start: 2016-05-08 — End: 2016-12-18

## 2016-05-08 NOTE — Telephone Encounter (Signed)
[employer] faxed paperwork needing additional information In dr Diona Browner in box

## 2016-05-09 DIAGNOSIS — I10 Essential (primary) hypertension: Secondary | ICD-10-CM

## 2016-05-09 NOTE — Telephone Encounter (Signed)
Paperwork faxed °

## 2016-05-13 NOTE — Telephone Encounter (Signed)
Left message letting pt know paperwork has been faxed Copy for pt Copy for file Copy for scan Copy for billing

## 2016-05-21 ENCOUNTER — Other Ambulatory Visit (INDEPENDENT_AMBULATORY_CARE_PROVIDER_SITE_OTHER): Payer: Federal, State, Local not specified - PPO

## 2016-05-21 DIAGNOSIS — E782 Mixed hyperlipidemia: Secondary | ICD-10-CM | POA: Diagnosis not present

## 2016-05-21 LAB — LIPID PANEL
Cholesterol: 181 mg/dL (ref 0–200)
HDL: 74.8 mg/dL
LDL Cholesterol: 84 mg/dL (ref 0–99)
NonHDL: 105.79
Total CHOL/HDL Ratio: 2
Triglycerides: 107 mg/dL (ref 0.0–149.0)
VLDL: 21.4 mg/dL (ref 0.0–40.0)

## 2016-05-21 LAB — HEMOGLOBIN A1C: Hgb A1c MFr Bld: 6 % (ref 4.6–6.5)

## 2016-06-11 ENCOUNTER — Ambulatory Visit (INDEPENDENT_AMBULATORY_CARE_PROVIDER_SITE_OTHER): Payer: Federal, State, Local not specified - PPO | Admitting: Family Medicine

## 2016-06-11 ENCOUNTER — Encounter: Payer: Self-pay | Admitting: Family Medicine

## 2016-06-11 VITALS — BP 120/80 | HR 75 | Temp 98.4°F | Ht 66.5 in | Wt 172.5 lb

## 2016-06-11 DIAGNOSIS — E782 Mixed hyperlipidemia: Secondary | ICD-10-CM | POA: Diagnosis not present

## 2016-06-11 DIAGNOSIS — R7303 Prediabetes: Secondary | ICD-10-CM | POA: Diagnosis not present

## 2016-06-11 DIAGNOSIS — Z125 Encounter for screening for malignant neoplasm of prostate: Secondary | ICD-10-CM | POA: Diagnosis not present

## 2016-06-11 DIAGNOSIS — I10 Essential (primary) hypertension: Secondary | ICD-10-CM

## 2016-06-11 DIAGNOSIS — Z0000 Encounter for general adult medical examination without abnormal findings: Secondary | ICD-10-CM

## 2016-06-11 MED ORDER — NICOTINE POLACRILEX 4 MG MT GUM: 4.0000 mg | CHEWING_GUM | OROMUCOSAL | 11 refills | Status: DC | PRN

## 2016-06-11 NOTE — Assessment & Plan Note (Signed)
On moderate dose statin.

## 2016-06-11 NOTE — Assessment & Plan Note (Signed)
Well controlled. Continue current medication.  

## 2016-06-11 NOTE — Patient Instructions (Addendum)
Work on regular exercise and continue healthy diet. Stop at lab on way out for PSA. Chew at least one piece of gum every one to two hours while awake and also whenever there is an urge to smoke. Use up to 24 pieces of gum per day for six weeks. Gradually reduce use over a second six weeks, for a total duration of three months.

## 2016-06-11 NOTE — Progress Notes (Signed)
   Subjective:    Patient ID: Devin Stanton, male    DOB: 10-Apr-1961, 56 y.o.   MRN: [id number]  HPI  The patient is here for annual wellness exam and preventative care.    Congestion, cough.. Ongoing x 5 days. No fever. Mild  Right ear pain, no face pain.  Mild ST.  No SOB, no wheeze. Alkaseltzer cold medicine. Cough occ keeping him up at night.  Hypertension:    Well controlled on lotensin HCT and amlodipine, toprol XL BP Readings from Last 3 Encounters:  06/11/16 120/80  04/09/16 128/74  04/05/16 132/80  Using medication without problems or lightheadedness:  None Chest pain with exertion: None Edema:None Short of breath: None Average home BPs: Other issues:  Elevated Cholesterol: LDL good control on moderate dose stain crestor 10mg  Lab Results  Component Value Date   CHOL 181 05/21/2016   HDL 74.80 05/21/2016   LDLCALC 84 05/21/2016   LDLDIRECT 167.2 04/14/2009   TRIG 107.0 05/21/2016   CHOLHDL 2 05/21/2016  Using medications without problems:none Muscle aches: none Diet compliance: Working on healthy diet Exercise: none Other complaints: Body mass index is 27.43 kg/m.  Prediabetes:  Lab Results  Component Value Date   HGBA1C 6.0 05/21/2016     Social History /Family History/Past Medical History reviewed and updated if needed.  Review of Systems  Constitutional: Negative for fatigue and fever.  HENT: Negative for ear pain.   Eyes: Negative for pain.  Respiratory: Negative for cough and shortness of breath.   Cardiovascular: Negative for chest pain, palpitations and leg swelling.  Gastrointestinal: Negative for abdominal pain.  Genitourinary: Negative for dysuria.  Musculoskeletal: Negative for arthralgias.  Neurological: Negative for syncope, light-headedness and headaches.  Psychiatric/Behavioral: Negative for dysphoric mood.       Objective:   Physical Exam  Constitutional: Vital signs are normal. He appears well-developed and well-nourished.    HENT:  Head: Normocephalic.  Right Ear: Hearing normal.  Left Ear: Hearing normal.  Nose: Nose normal.  Mouth/Throat: Oropharynx is clear and moist and mucous membranes are normal.  Neck: Trachea normal. Carotid bruit is not present. No thyroid mass and no thyromegaly present.  Cardiovascular: Normal rate, regular rhythm and normal pulses.  Exam reveals no gallop, no distant heart sounds and no friction rub.   No murmur heard. No peripheral edema  Pulmonary/Chest: Effort normal and breath sounds normal. No respiratory distress.  Skin: Skin is warm, dry and intact. No rash noted.  Psychiatric: He has a normal mood and affect. His speech is normal and behavior is normal. Thought content normal.          Assessment & Plan:  The patient's preventative maintenance and recommended screening tests for an annual wellness exam were reviewed in full today. Brought up to date unless services declined.  Counselled on the importance of diet, exercise, and its role in overall health and mortality. The patient's FH and SH was reviewed, including their home life, tobacco status, and drug and alcohol status.   Vaccines:uptodate  Colon:04/2011 nml, repeat in 10 years  Prostate: stable Lab Results  Component Value Date   PSA 0.80 05/31/2014   PSA 0.47 03/11/2012   PSA 0.58 04/17/2010  Use smokeless tobacco, daily. Open to  treating with nicotine  replacement. Hep C: done HIV : refused  ETOH:  1 drink 2-3 times a week. Drug use: none

## 2016-06-11 NOTE — Progress Notes (Signed)
Pre visit review using our clinic review tool, if applicable. No additional management support is needed unless otherwise documented below in the visit note. 

## 2016-06-11 NOTE — Assessment & Plan Note (Signed)
Stable control. 

## 2016-06-12 LAB — PSA: PSA: 0.98 ng/mL (ref 0.10–4.00)

## 2016-06-13 ENCOUNTER — Other Ambulatory Visit: Payer: Self-pay | Admitting: *Deleted

## 2016-06-13 MED ORDER — ROSUVASTATIN CALCIUM 10 MG PO TABS: 10.0000 mg | ORAL_TABLET | Freq: Every day | ORAL | 3 refills | Status: DC

## 2016-06-25 MED ORDER — NICOTINE POLACRILEX 4 MG MT GUM: 4.0000 mg | CHEWING_GUM | OROMUCOSAL | 11 refills | Status: DC | PRN

## 2016-06-25 NOTE — Telephone Encounter (Signed)
Pt says he needs a refill of nicorete gum sent to a [facility] instead of [facility]. I have sent the refill

## 2016-06-25 NOTE — Addendum Note (Signed)
Addended by: Pilar Grammes on: 06/25/2016 04:15 PM   Modules accepted: Orders

## 2016-06-28 DIAGNOSIS — I10 Essential (primary) hypertension: Secondary | ICD-10-CM | POA: Diagnosis not present

## 2016-06-28 DIAGNOSIS — Z6825 Body mass index (BMI) 25.0-25.9, adult: Secondary | ICD-10-CM | POA: Diagnosis not present

## 2016-06-28 DIAGNOSIS — M542 Cervicalgia: Secondary | ICD-10-CM | POA: Diagnosis not present

## 2016-06-28 DIAGNOSIS — M4312 Spondylolisthesis, cervical region: Secondary | ICD-10-CM | POA: Diagnosis not present

## 2016-07-11 ENCOUNTER — Other Ambulatory Visit: Payer: Self-pay | Admitting: *Deleted

## 2016-07-11 DIAGNOSIS — I10 Essential (primary) hypertension: Secondary | ICD-10-CM

## 2016-07-11 MED ORDER — BENAZEPRIL-HYDROCHLOROTHIAZIDE 20-25 MG PO TABS: ORAL_TABLET | ORAL | 1 refills | Status: DC

## 2016-08-06 DIAGNOSIS — M7711 Lateral epicondylitis, right elbow: Secondary | ICD-10-CM | POA: Diagnosis not present

## 2016-12-18 ENCOUNTER — Other Ambulatory Visit: Payer: Self-pay | Admitting: Family Medicine

## 2016-12-26 ENCOUNTER — Other Ambulatory Visit: Payer: Self-pay | Admitting: Family Medicine

## 2016-12-26 DIAGNOSIS — I10 Essential (primary) hypertension: Secondary | ICD-10-CM

## 2017-05-02 ENCOUNTER — Other Ambulatory Visit: Payer: Self-pay | Admitting: Family Medicine

## 2017-05-13 ENCOUNTER — Telehealth: Payer: Self-pay

## 2017-05-13 NOTE — Telephone Encounter (Signed)
I spoke with Dr Diona Browner and she said if OK with pt will do labs when pt seen 05/20/17; pt voiced understanding and will come fasting (fasting 4 hrs prior to appt on 05/20/17.

## 2017-05-13 NOTE — Telephone Encounter (Signed)
Has pt male appt for fasting labs and I will order labs when requested by  lab

## 2017-05-13 NOTE — Telephone Encounter (Signed)
Copied from [place] [phone]. 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 Per Devin Stanton to send message back to request orders for labs.   >> May 13, 2017  2:23 PM Bea Graff, NT wrote: Can these labs be used for his CPE scheduled in January as well/ Please advise.

## 2017-05-13 NOTE — Telephone Encounter (Signed)
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

## 2017-05-20 ENCOUNTER — Ambulatory Visit: Payer: Federal, State, Local not specified - PPO | Admitting: Family Medicine

## 2017-05-22 ENCOUNTER — Ambulatory Visit (INDEPENDENT_AMBULATORY_CARE_PROVIDER_SITE_OTHER): Payer: Federal, State, Local not specified - PPO | Admitting: Family Medicine

## 2017-05-22 ENCOUNTER — Encounter: Payer: Self-pay | Admitting: Family Medicine

## 2017-05-22 ENCOUNTER — Other Ambulatory Visit: Payer: Self-pay

## 2017-05-22 VITALS — BP 112/82 | HR 78 | Temp 98.4°F | Ht 66.5 in | Wt 172.8 lb

## 2017-05-22 DIAGNOSIS — E785 Hyperlipidemia, unspecified: Secondary | ICD-10-CM | POA: Diagnosis not present

## 2017-05-22 DIAGNOSIS — Z23 Encounter for immunization: Secondary | ICD-10-CM | POA: Diagnosis not present

## 2017-05-22 DIAGNOSIS — Z125 Encounter for screening for malignant neoplasm of prostate: Secondary | ICD-10-CM

## 2017-05-22 DIAGNOSIS — I10 Essential (primary) hypertension: Secondary | ICD-10-CM | POA: Diagnosis not present

## 2017-05-22 DIAGNOSIS — R7303 Prediabetes: Secondary | ICD-10-CM | POA: Diagnosis not present

## 2017-05-22 DIAGNOSIS — Z0000 Encounter for general adult medical examination without abnormal findings: Secondary | ICD-10-CM | POA: Diagnosis not present

## 2017-05-22 LAB — COMPREHENSIVE METABOLIC PANEL
ALK PHOS: 62 U/L (ref 39–117)
ALT: 46 U/L (ref 0–53)
AST: 32 U/L (ref 0–37)
Albumin: 4.9 g/dL (ref 3.5–5.2)
BILIRUBIN TOTAL: 0.6 mg/dL (ref 0.2–1.2)
BUN: 20 mg/dL (ref 6–23)
CO2: 32 mEq/L (ref 19–32)
CREATININE: 1.07 mg/dL (ref 0.40–1.50)
Calcium: 10 mg/dL (ref 8.4–10.5)
Chloride: 96 mEq/L (ref 96–112)
GFR: 75.71 mL/min (ref >60.00)
GLUCOSE: 101 mg/dL — AB (ref 70–99)
Potassium: 4.6 mEq/L (ref 3.5–5.1)
Sodium: 135 mEq/L (ref 135–145)
TOTAL PROTEIN: 8 g/dL (ref 6.0–8.3)

## 2017-05-22 LAB — LIPID PANEL
CHOL/HDL RATIO: 2
Cholesterol: 201 mg/dL — ABNORMAL HIGH (ref 0–200)
HDL: 83 mg/dL (ref >39.00)
LDL Cholesterol: 95 mg/dL (ref 0–99)
NONHDL: 118.42
TRIGLYCERIDES: 118 mg/dL (ref 0.0–149.0)
VLDL: 23.6 mg/dL (ref 0.0–40.0)

## 2017-05-22 LAB — HEMOGLOBIN A1C: HEMOGLOBIN A1C: 5.9 % (ref 4.6–6.5)

## 2017-05-22 LAB — PSA: PSA: 1.81 ng/mL (ref 0.10–4.00)

## 2017-05-22 NOTE — Assessment & Plan Note (Signed)
Due for re-eval . Tolerating crestor well.

## 2017-05-22 NOTE — Progress Notes (Signed)
Subjective:    Patient ID: Devin Stanton, male    DOB: May 07, 1961, 56 y.o.   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 Needs EKG and labs only. The patient is here for annual wellness exam and preventative care.     Hypertension:  Good control on  Amlodipine, lotensin HCT and metoprolol xl. BP Readings from Last 3 Encounters:  05/22/17 112/82  06/11/16 120/80  04/09/16 128/74  Using medication without problems or lightheadedness:  none Chest pain with exertion: none Edema: none Short of breath: none Average home BPs: good Other issues: family hx of CAD   Hyperlipidemia and prediabetes: due for lab re-eval. On crestor 10 mg daily.  Minimal leg cramps.  EKG: unchanged from previous tracings.   2017 heart cath Dr. Ellyn Hack cardiology: Angiographically minimal if any CAD. Moderate to significant tortuosity noted throughout.  The left ventricular systolic function is normal.  LV end diastolic pressure is normal.  Normal dominant aorta with widely patent renal arteries.  The left ventricular ejection fraction is 55-65% by visual estimate.    Patient has widely patent coronary arteries, but are quite tortuous. I doubt that he has truly ischemic anginal chest pain, however I exclude microvascular ischemia in the setting of hypertensive heart disease.  Can exclude renal artery stenosis as etiology for his labile hypertension.   Updated vaccines.    Moderate diet  Minimal exercise.  Blood pressure 112/82, pulse 78, temperature 98.4 F (36.9 C), temperature source Oral, height 5' 6.5" (1.689 m), weight 172 lb 12 oz (78.4 kg).    Review of Systems  Constitutional: Negative for fatigue and fever.  HENT: Negative for ear pain.   Eyes: Negative for pain.  Respiratory: Negative for cough and shortness of breath.   Cardiovascular: Negative for chest pain, palpitations and leg swelling.  Gastrointestinal: Negative for abdominal pain.    Genitourinary: Negative for dysuria.  Musculoskeletal: Negative for arthralgias.  Neurological: Negative for syncope, light-headedness and headaches.  Psychiatric/Behavioral: Negative for dysphoric mood.       Objective:   Physical Exam  Constitutional: He appears well-developed and well-nourished.  Non-toxic appearance. He does not appear ill. No distress.  HENT:  Head: Normocephalic and atraumatic.  Right Ear: Hearing, tympanic membrane, external ear and ear canal normal.  Left Ear: Hearing, tympanic membrane, external ear and ear canal normal.  Nose: Nose normal.  Mouth/Throat: Uvula is midline, oropharynx is clear and moist and mucous membranes are normal.  Eyes: Conjunctivae, EOM and lids are normal. Pupils are equal, round, and reactive to light. Lids are everted and swept, no foreign bodies found.  Neck: Trachea normal, normal range of motion and phonation normal. Neck supple. Carotid bruit is not present. No thyroid mass and no thyromegaly present.  Cardiovascular: Normal rate, regular rhythm, S1 normal, S2 normal, intact distal pulses and normal pulses. Exam reveals no gallop.  No murmur heard. Pulmonary/Chest: Breath sounds normal. He has no wheezes. He has no rhonchi. He has no rales.  Abdominal: Soft. Normal appearance and bowel sounds are normal. There is no hepatosplenomegaly. There is no tenderness. There is no rebound, no guarding and no CVA tenderness. No hernia.  Lymphadenopathy:    He has no cervical adenopathy.  Neurological: He is alert. He has normal strength and normal reflexes. No cranial nerve deficit or sensory deficit. Gait normal.  Skin: Skin is warm, dry and intact. No rash noted.  Psychiatric: He has a normal mood and affect. His  speech is normal and behavior is normal. Judgment normal.          Assessment & Plan:  The patient's preventative maintenance and recommended screening tests for an annual wellness exam were reviewed in full today. Brought  up to date unless services declined.  Counselled on the importance of diet, exercise, and its role in overall health and mortality. The patient's FH and SH was reviewed, including their home life, tobacco status, and drug and alcohol status.    Check PSA.  Uptodate with colonoscopy  Update vaccines

## 2017-05-22 NOTE — Patient Instructions (Signed)
Cancel next appt.  Please stop at the lab to have labs drawn.

## 2017-05-22 NOTE — Assessment & Plan Note (Signed)
Well controlled. Continue current medication. Encouraged exercise, weight loss, healthy eating habits.  

## 2017-05-22 NOTE — Assessment & Plan Note (Signed)
Due for re-eval. 

## 2017-06-13 DIAGNOSIS — D485 Neoplasm of uncertain behavior of skin: Secondary | ICD-10-CM | POA: Diagnosis not present

## 2017-06-13 DIAGNOSIS — Z85828 Personal history of other malignant neoplasm of skin: Secondary | ICD-10-CM | POA: Diagnosis not present

## 2017-06-13 DIAGNOSIS — C44519 Basal cell carcinoma of skin of other part of trunk: Secondary | ICD-10-CM | POA: Diagnosis not present

## 2017-06-13 DIAGNOSIS — M7711 Lateral epicondylitis, right elbow: Secondary | ICD-10-CM | POA: Diagnosis not present

## 2017-06-19 ENCOUNTER — Ambulatory Visit: Payer: Federal, State, Local not specified - PPO | Admitting: Family Medicine

## 2017-06-19 ENCOUNTER — Encounter: Payer: Self-pay | Admitting: Family Medicine

## 2017-06-19 VITALS — BP 140/80 | HR 86 | Temp 98.6°F | Ht 66.5 in | Wt 177.1 lb

## 2017-06-19 DIAGNOSIS — B029 Zoster without complications: Secondary | ICD-10-CM | POA: Insufficient documentation

## 2017-06-19 MED ORDER — VALACYCLOVIR HCL 1 G PO TABS: 1000.0000 mg | ORAL_TABLET | Freq: Three times a day (TID) | ORAL | 0 refills | Status: AC

## 2017-06-19 NOTE — Patient Instructions (Addendum)
Shingles Shingles, which is also known as herpes zoster, is an infection that causes a painful skin rash and fluid-filled blisters. Shingles is not related to genital herpes, which is a sexually transmitted infection. Shingles only develops in people who:  Have had chickenpox.  Have received the chickenpox vaccine. (This is rare.)  What are the causes? Shingles is caused by varicella-zoster virus (VZV). This is the same virus that causes chickenpox. After exposure to VZV, the virus stays in the body in an inactive (dormant) state. Shingles develops if the virus reactivates. This can happen many years after the initial exposure to VZV. It is not known what causes this virus to reactivate. What increases the risk? People who have had chickenpox or received the chickenpox vaccine are at risk for shingles. Infection is more common in people who:  Are older than age 50.  Have a weakened defense (immune) system, such as those with HIV, AIDS, or cancer.  Are taking medicines that weaken the immune system, such as transplant medicines.  Are under great stress.  What are the signs or symptoms? Early symptoms of this condition include itching, tingling, and pain in an area on your skin. Pain may be described as burning, stabbing, or throbbing. A few days or weeks after symptoms start, a painful red rash appears, usually on one side of the body in a bandlike or beltlike pattern. The rash eventually turns into fluid-filled blisters that break open, scab over, and dry up in about 2-3 weeks. At any time during the infection, you may also develop:  A fever.  Chills.  A headache.  An upset stomach.  How is this diagnosed? This condition is diagnosed with a skin exam. Sometimes, skin or fluid samples are taken from the blisters before a diagnosis is made. These samples are examined under a microscope or sent to a lab for testing. How is this treated? There is no specific cure for this condition.  Your health care provider will probably prescribe medicines to help you manage pain, recover more quickly, and avoid long-term problems. Medicines may include:  Antiviral drugs.  Anti-inflammatory drugs.  Pain medicines.  If the area involved is on your face, you may be referred to a specialist, such as an eye doctor (ophthalmologist) or an ear, nose, and throat (ENT) doctor to help you avoid eye problems, chronic pain, or disability. Follow these instructions at home: Medicines  Take medicines only as directed by your health care provider.  Apply an anti-itch or numbing cream to the affected area as directed by your health care provider. Blister and Rash Care  Take a cool bath or apply cool compresses to the area of the rash or blisters as directed by your health care provider. This may help with pain and itching.  Keep your rash covered with a loose bandage (dressing). Wear loose-fitting clothing to help ease the pain of material rubbing against the rash.  Keep your rash and blisters clean with mild soap and cool water or as directed by your health care provider.  Check your rash every day for signs of infection. These include redness, swelling, and pain that lasts or increases.  Do not pick your blisters.  Do not scratch your rash. General instructions  Rest as directed by your health care provider.  Keep all follow-up visits as directed by your health care provider. This is important.  Until your blisters scab over, your infection can cause chickenpox in people who have never had it or been vaccinated   against it. To prevent this from happening, avoid contact with other people, especially: ? Babies. ? Pregnant women. ? Children who have eczema. ? Elderly people who have transplants. ? People who have chronic illnesses, such as leukemia or AIDS. Contact a health care provider if:  Your pain is not relieved with prescribed medicines.  Your pain does not get better after  the rash heals.  Your rash looks infected. Signs of infection include redness, swelling, and pain that lasts or increases. Get help right away if:  The rash is on your face or nose.  You have facial pain, pain around your eye area, or loss of feeling on one side of your face.  You have ear pain or you have ringing in your ear.  You have loss of taste.  Your condition gets worse. This information is not intended to replace advice given to you by your health care provider. Make sure you discuss any questions you have with your health care provider. Document Released: 05/27/2005 Document Revised: 01/21/2016 Document Reviewed: 04/07/2014 Elsevier Interactive Patient Education  2018 [place] Syndrome Ramsay Hunt syndrome (RHS) is a viral infection that affects the nerves in the face and the nerves near the inner ear. The infection is caused by the varicella zoster virus (VZV). This is the same virus that causes chicken pox and shingles. After a person has chicken pox, this virus may become inactive. Years later, the virus can become active again and cause Ramsay Hunt syndrome. The trigger may be something that weakens the body's defense system (immune system), like stress. When VZV becomes activated, it moves up the facial nerve and causes a painful rash in or around the ear canal. It may also travel up the nerve that supplies hearing. RHS cannot be passed from person to person (is not contagious). However, if a person who has never had chicken pox comes in contact with fluid from someone's skin blisters, he or she may develop chicken pox. What are the causes? This condition is caused by the varicella zoster virus. What increases the risk? You may be at risk for RHS if you have had chicken pox. What are the signs or symptoms? Usually, the first symptom of RHS is deep and severe pain in the affected ear. This is often followed by a rash with blisters that breaks out around the  ear. The rash may go into the inner ear, along the side of the face, or up the scalp. Other symptoms may include:  A rash inside the mouth.  Severe, burning pain wherever the rash develops.  The main symptom of this condition is facial nerve weakness, which may cause:  Drooping on one side of the face.  Being unable to close the eyelid on the affected side of the face.  Having trouble eating.  Losing the sense of taste on the side of the tongue.  If RHS affects the inner ear nerve (auditory nerve), other symptoms may be present, such as:  Hearing loss.  A spinning sensation (vertigo).  Clumsiness.  Ringing in the ear (tinnitus).  How is this diagnosed? Your health care provider may be able to diagnose RHS based on your signs and symptoms. You may also have tests to help your health care provider confirm the diagnosis. These tests may include:  Blood tests to check for antibodies to VZV. Antibodies are proteins that your body produces in response to germs.  An MRI.  Nerve conduction studies (electroneurogram).  Hearing tests (audiology).  How is this treated? RHS will run its course with or without treatment. If treatment starts within the first 3 days of having symptoms, it may shorten the course of RHS and prevent your facial nerve from continuing to weaken. Without treatment, it is possible that you may not recover full use of your facial nerve. You may have to take medicine, including:  An anti-inflammatory medicine called prednisone.  An antiviral medicine to treat the virus.  A prescription pain reliever to control pain.  Antibiotic medicine, if the rash becomes infected.  Follow these instructions at home:  Take over-the-counter and prescription medicines only as told by your health care provider.  If you were prescribed an antibiotic medicine, take or apply it as told by your health care provider. Do not stop using the antibiotic even if your condition  improves.  Do not drive or use heavy machinery while taking prescription pain medicine.  If told by your health care provider, use artificial tears and wear an eye patch to protect your eye until you can close your eyelid again.  Keep all follow-up visits as told by your health care provider. This is important. Get help right away if:  Your pain medicine is not helping.  You have chills or fever.  Your symptoms get worse.  Your symptoms have not gone away after 2 weeks.  You have any changes in vision. This information is not intended to replace advice given to you by your health care provider. Make sure you discuss any questions you have with your health care provider. Document Released: 05/17/2002 Document Revised: 11/08/2015 Document Reviewed: 09/10/2013 Elsevier Interactive Patient Education  2018 [employer]. Valacyclovir caplets What is this medicine? VALACYCLOVIR (val ay SYE kloe veer) is an antiviral medicine. It is used to treat or prevent infections caused by certain kinds of viruses. Examples of these infections include herpes and shingles. This medicine will not cure herpes. This medicine may be used for other purposes; ask your health care provider or pharmacist if you have questions. COMMON BRAND NAME(S): Valtrex What should I tell my health care provider before I take this medicine? They need to know if you have any of these conditions: -acquired immunodeficiency syndrome (AIDS) -any other condition that may weaken the immune system -bone marrow or kidney transplant -kidney disease -an unusual or allergic reaction to valacyclovir, acyclovir, ganciclovir, valganciclovir, other medicines, foods, dyes, or preservatives -pregnant or trying to get pregnant -breast-feeding How should I use this medicine? Take this medicine by mouth with a glass of water. Follow the directions on the prescription label. You can take this medicine with or without food. Take your doses at  regular intervals. Do not take your medicine more often than directed. Finish the full course prescribed by your doctor or health care professional even if you think your condition is better. Do not stop taking except on the advice of your doctor or health care professional. Talk to your pediatrician regarding the use of this medicine in children. While this drug may be prescribed for children as young as 2 years for selected conditions, precautions do apply. Overdosage: If you think you have taken too much of this medicine contact a poison control center or emergency room at once. NOTE: This medicine is only for you. Do not share this medicine with others. What if I miss a dose? If you miss a dose, take it as soon as you can. If it is almost time for your next dose, take only that dose.  Do not take double or extra doses. What may interact with this medicine? -cimetidine -probenecid This list may not describe all possible interactions. Give your health care provider a list of all the medicines, herbs, non-prescription drugs, or dietary supplements you use. Also tell them if you smoke, drink alcohol, or use illegal drugs. Some items may interact with your medicine. What should I watch for while using this medicine? Tell your doctor or health care professional if your symptoms do not start to get better after 1 week. This medicine works best when taken early in the course of an infection, within the first 57 hours. Begin treatment as soon as possible after the first signs of infection like tingling, itching, or pain in the affected area. It is possible that genital herpes may still be spread even when you are not having symptoms. Always use safer sex practices like condoms made of latex or polyurethane whenever you have sexual contact. You should stay well hydrated while taking this medicine. Drink plenty of fluids. What side effects may I notice from receiving this medicine? Side effects that you  should report to your doctor or health care professional as soon as possible: -allergic reactions like skin rash, itching or hives, swelling of the face, lips, or tongue -aggressive behavior -confusion -hallucinations -problems with balance, talking, walking -stomach pain -tremor -trouble passing urine or change in the amount of urine Side effects that usually do not require medical attention (report to your doctor or health care professional if they continue or are bothersome): -dizziness -headache -nausea, vomiting This list may not describe all possible side effects. Call your doctor for medical advice about side effects. You may report side effects to FDA at 1-800-FDA-1088. Where should I keep my medicine? Keep out of the reach of children. Store at room temperature between 15 and 25 degrees C (59 and 77 degrees F). Keep container tightly closed. Throw away any unused medicine after the expiration date. NOTE: This sheet is a summary. It may not cover all possible information. If you have questions about this medicine, talk to your doctor, pharmacist, or health care provider.  2018 Elsevier/Gold Standard (2012-05-12 16:34:05)

## 2017-06-19 NOTE — Progress Notes (Signed)
Subjective:  Patient ID: Devin Stanton, male    DOB: 1961/03/19  Age: 57 y.o. MRN: [id number]  CC: Rash (around eye, possible shingles?)   HPI Devin Stanton presents for stinging rash on his face that he first noticed last night. He said that it felt as though he had been walloped on the side of his head 2 days ago. He has had no ear pain or visual changes. There was some pain in the lateral corner of his right eye.  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   Outpatient Medications Prior to Visit  Medication Sig Dispense Refill  . amLODipine (NORVASC) 2.5 MG tablet TAKE 1 TABLET BY MOUTH DAILY 90 tablet 1  . aspirin 81 MG tablet Take 81 mg by mouth daily.    . benazepril-hydrochlorthiazide (LOTENSIN HCT) 20-25 MG tablet TAKE 1 TABLET BY MOUTH ONCE DAILY 90 tablet 1  . fluticasone (CUTIVATE) 0.005 % ointment Apply 1 application topically 2 (two) times daily. 30 g 0  . metoprolol succinate (TOPROL-XL) 50 MG 24 hr tablet TAKE 1 TABLET BY MOUTH DAILY 90 tablet 0  . rosuvastatin (CRESTOR) 10 MG tablet Take 1 tablet (10 mg total) by mouth daily. 90 tablet 3   No facility-administered medications prior to visit.     ROS Review of Systems  Constitutional: Negative for chills, diaphoresis, fatigue and fever.  HENT: Negative.  Negative for ear discharge and ear pain.   Eyes: Negative for photophobia, pain, redness, itching and visual disturbance.  Skin: Positive for color change and rash. Negative for wound.  Neurological: Positive for headaches. Negative for light-headedness.  Psychiatric/Behavioral: Negative for dysphoric mood.    Objective:  BP 140/80 (BP Location: Left Arm, Patient Position: Sitting, Cuff Size: Normal)   Pulse 86   Temp 98.6 F (37 C) (Oral)   Ht 5' 6.5" (1.689 m)   Wt 177 lb 2 oz (80.3 kg)   SpO2 97%   BMI 28.16 kg/m   BP Readings from Last 3 Encounters:  06/19/17 140/80  05/22/17 112/82  06/11/16 120/80    Wt  Readings from Last 3 Encounters:  06/19/17 177 lb 2 oz (80.3 kg)  05/22/17 172 lb 12 oz (78.4 kg)  06/11/16 172 lb 8 oz (78.2 kg)    Physical Exam  Constitutional: He is oriented to person, place, and time. He appears well-developed and well-nourished. No distress.  HENT:  Head: Normocephalic and atraumatic.  Right Ear: Tympanic membrane, external ear and ear canal normal.  Left Ear: Tympanic membrane, external ear and ear canal normal.  Mouth/Throat: Oropharynx is clear and moist. No oropharyngeal exudate.  Eyes: Conjunctivae and EOM are normal. Pupils are equal, round, and reactive to light. Right eye exhibits no discharge. Left eye exhibits no discharge. No scleral icterus.  Neck: Neck supple. No JVD present. No tracheal deviation present. No thyromegaly present.  Pulmonary/Chest: Effort normal. No stridor.  Lymphadenopathy:    He has no cervical adenopathy.  Neurological: He is alert and oriented to person, place, and time. No cranial nerve deficit.  Facial nerve function is normal.  Skin: Skin is warm and dry. He is not diaphoretic.     Psychiatric: He has a normal mood and affect. His behavior is normal.    Lab Results  Component Value Date   WBC 7.3 04/08/2016   HGB 13.8 04/08/2016   HCT 40.2 04/08/2016   PLT 275 04/08/2016   GLUCOSE 101 (H)  05/22/2017   CHOL 201 (H) 05/22/2017   TRIG 118.0 05/22/2017   HDL 83.00 05/22/2017   LDLDIRECT 167.2 04/14/2009   LDLCALC 95 05/22/2017   ALT 46 05/22/2017   AST 32 05/22/2017   NA 135 05/22/2017   K 4.6 05/22/2017   CL 96 05/22/2017   CREATININE 1.07 05/22/2017   BUN 20 05/22/2017   CO2 32 05/22/2017   TSH 1.58 03/22/2016   PSA 1.81 05/22/2017   INR 0.95 04/08/2016   HGBA1C 5.9 05/22/2017    Dg Chest 2 View  Result Date: 04/08/2016 CLINICAL DATA:  Left-sided chest pain over the last 3 weeks. Pain radiates down the left arm. EXAM: CHEST  2 VIEW COMPARISON:  None. FINDINGS: Heart size is normal. Mediastinal shadows  are normal. The lungs are clear. No bronchial thickening. No infiltrate, mass, effusion or collapse. Pulmonary vascularity is normal. No bony abnormality. IMPRESSION: Normal chest Electronically Signed   By: Nelson Chimes M.D.   On: 04/08/2016 12:43    Assessment & Plan:   Reiss was seen today for rash.  Diagnoses and all orders for this visit:  Herpes zoster without complication -     valACYclovir (VALTREX) 1000 MG tablet; Take 1 tablet (1,000 mg total) by mouth 3 (three) times daily for 7 days.  Patient will start his Valtrex immediately.  He will return here or to his eye doctor with any changes in his vision or eye pain immediately. I am having Devin Stanton start on valACYclovir. I am also having him maintain his aspirin, fluticasone, rosuvastatin, amLODipine, benazepril-hydrochlorthiazide, and metoprolol succinate.  Meds ordered this encounter  Medications  . valACYclovir (VALTREX) 1000 MG tablet    Sig: Take 1 tablet (1,000 mg total) by mouth 3 (three) times daily for 7 days.    Dispense:  21 tablet    Refill:  0     Follow-up: No Follow-up on file.  Libby Maw, MD

## 2017-06-20 ENCOUNTER — Encounter: Payer: Federal, State, Local not specified - PPO | Admitting: Family Medicine

## 2017-06-20 DIAGNOSIS — B029 Zoster without complications: Secondary | ICD-10-CM | POA: Diagnosis not present

## 2017-06-26 ENCOUNTER — Telehealth: Payer: Self-pay | Admitting: Family Medicine

## 2017-06-26 NOTE — Telephone Encounter (Signed)
Forms placed in Dr. Rometta Emery in box to review and sign.

## 2017-06-26 NOTE — Telephone Encounter (Signed)
Patient dropped off FMLA paperwork. 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  Patient filled out FMLA and left a blank copy of the form in case Dr.Bedsole wanted to fill it out herself. Please call patient with any questions and when form is ready. Form is in rx tower.

## 2017-07-01 NOTE — Telephone Encounter (Signed)
FMLA paperwork in  My outbox ready for pick up.

## 2017-07-02 NOTE — Telephone Encounter (Signed)
Left message for Eliaz that his FMLA paperwork is ready to be picked up at the front desk.

## 2017-07-21 ENCOUNTER — Other Ambulatory Visit: Payer: Self-pay | Admitting: Family Medicine

## 2017-07-21 DIAGNOSIS — C44519 Basal cell carcinoma of skin of other part of trunk: Secondary | ICD-10-CM | POA: Diagnosis not present

## 2017-07-21 DIAGNOSIS — L905 Scar conditions and fibrosis of skin: Secondary | ICD-10-CM | POA: Diagnosis not present

## 2017-07-21 DIAGNOSIS — I10 Essential (primary) hypertension: Secondary | ICD-10-CM

## 2017-08-27 ENCOUNTER — Telehealth: Payer: Self-pay | Admitting: Family Medicine

## 2017-08-27 NOTE — Telephone Encounter (Signed)
Spouse dropped off short term disability form to be filled out Pt filled out paperwork  In dr bedsole's in box

## 2017-08-28 DIAGNOSIS — Z0279 Encounter for issue of other medical certificate: Secondary | ICD-10-CM

## 2017-08-28 NOTE — Telephone Encounter (Signed)
Done

## 2017-08-28 NOTE — Telephone Encounter (Signed)
Paperwork faxed Copy for pt  Copy for scan Copy for billing  Left message letting pt know paperwork has been

## 2017-11-24 DIAGNOSIS — D2262 Melanocytic nevi of left upper limb, including shoulder: Secondary | ICD-10-CM | POA: Diagnosis not present

## 2017-11-24 DIAGNOSIS — D2272 Melanocytic nevi of left lower limb, including hip: Secondary | ICD-10-CM | POA: Diagnosis not present

## 2017-11-24 DIAGNOSIS — Z85828 Personal history of other malignant neoplasm of skin: Secondary | ICD-10-CM | POA: Diagnosis not present

## 2017-11-24 DIAGNOSIS — D2261 Melanocytic nevi of right upper limb, including shoulder: Secondary | ICD-10-CM | POA: Diagnosis not present

## 2017-11-24 DIAGNOSIS — X32XXXA Exposure to sunlight, initial encounter: Secondary | ICD-10-CM | POA: Diagnosis not present

## 2017-11-24 DIAGNOSIS — L570 Actinic keratosis: Secondary | ICD-10-CM | POA: Diagnosis not present

## 2018-01-12 DIAGNOSIS — R200 Anesthesia of skin: Secondary | ICD-10-CM | POA: Insufficient documentation

## 2018-01-12 DIAGNOSIS — M542 Cervicalgia: Secondary | ICD-10-CM | POA: Diagnosis not present

## 2018-01-15 DIAGNOSIS — M542 Cervicalgia: Secondary | ICD-10-CM | POA: Diagnosis not present

## 2018-01-15 DIAGNOSIS — R200 Anesthesia of skin: Secondary | ICD-10-CM | POA: Diagnosis not present

## 2018-01-23 DIAGNOSIS — M542 Cervicalgia: Secondary | ICD-10-CM | POA: Diagnosis not present

## 2018-01-23 DIAGNOSIS — I10 Essential (primary) hypertension: Secondary | ICD-10-CM | POA: Diagnosis not present

## 2018-01-23 DIAGNOSIS — M4722 Other spondylosis with radiculopathy, cervical region: Secondary | ICD-10-CM | POA: Diagnosis not present

## 2018-01-23 DIAGNOSIS — Z6826 Body mass index (BMI) 26.0-26.9, adult: Secondary | ICD-10-CM | POA: Diagnosis not present

## 2018-02-11 ENCOUNTER — Other Ambulatory Visit: Payer: Self-pay | Admitting: Family Medicine

## 2018-02-11 DIAGNOSIS — I10 Essential (primary) hypertension: Secondary | ICD-10-CM

## 2018-03-02 DIAGNOSIS — M7711 Lateral epicondylitis, right elbow: Secondary | ICD-10-CM | POA: Diagnosis not present

## 2018-04-14 ENCOUNTER — Encounter: Payer: Self-pay | Admitting: Family Medicine

## 2018-04-14 ENCOUNTER — Ambulatory Visit (INDEPENDENT_AMBULATORY_CARE_PROVIDER_SITE_OTHER): Payer: Federal, State, Local not specified - PPO | Admitting: Family Medicine

## 2018-04-14 VITALS — BP 124/85 | HR 71 | Temp 98.3°F | Ht 67.5 in | Wt 167.0 lb

## 2018-04-14 DIAGNOSIS — E785 Hyperlipidemia, unspecified: Secondary | ICD-10-CM

## 2018-04-14 DIAGNOSIS — R7303 Prediabetes: Secondary | ICD-10-CM | POA: Diagnosis not present

## 2018-04-14 DIAGNOSIS — Z23 Encounter for immunization: Secondary | ICD-10-CM | POA: Diagnosis not present

## 2018-04-14 DIAGNOSIS — I10 Essential (primary) hypertension: Secondary | ICD-10-CM

## 2018-04-14 DIAGNOSIS — Z0000 Encounter for general adult medical examination without abnormal findings: Secondary | ICD-10-CM | POA: Diagnosis not present

## 2018-04-14 DIAGNOSIS — Z125 Encounter for screening for malignant neoplasm of prostate: Secondary | ICD-10-CM

## 2018-04-14 MED ORDER — ROSUVASTATIN CALCIUM 10 MG PO TABS: 10.0000 mg | ORAL_TABLET | Freq: Every day | ORAL | 3 refills | Status: DC

## 2018-04-14 MED ORDER — BENAZEPRIL-HYDROCHLOROTHIAZIDE 20-25 MG PO TABS: 1.0000 | ORAL_TABLET | Freq: Every day | ORAL | 3 refills | Status: DC

## 2018-04-14 MED ORDER — METOPROLOL SUCCINATE ER 50 MG PO TB24: 50.0000 mg | ORAL_TABLET | Freq: Every day | ORAL | 3 refills | Status: DC

## 2018-04-14 MED ORDER — AMLODIPINE BESYLATE 2.5 MG PO TABS: 2.5000 mg | ORAL_TABLET | Freq: Every day | ORAL | 3 refills | Status: DC

## 2018-04-14 NOTE — Assessment & Plan Note (Signed)
Well controlled. Continue current medication.  

## 2018-04-14 NOTE — Assessment & Plan Note (Signed)
Due for re-eval. 

## 2018-04-14 NOTE — Progress Notes (Signed)
Subjective:    Patient ID: Devin Stanton, male    DOB: September 03, 1960, 57 y.o.   MRN: [id number]  HPI   The patient is here for annual wellness exam and preventative care.    Hypertension:  Good control on  Amlodipine, lotensin HCT and metoprolol xl.  Was working all night last night.. Could be why BP up today.   Using medication without problems or lightheadedness:  none Chest pain with exertion:none Edema:none Short of breath:none Average home BPs: not checking. Other issues: Prediabetes  Due for re-eval of A1C and cholesterol. On crestor 10 mg daily ( mild leg cramps) Lab Results  Component Value Date   HGBA1C 5.9 05/22/2017    Body mass index is 25.77 kg/m.   Exercise: [place]  [place]  Diet: eating well, fruit and veggies, low fats, still has a lot of red meat.   Blood pressure 140/84, pulse 71, temperature 98.3 F (36.8 C), temperature source Oral, height 5' 7.5" (1.715 m), weight 167 lb (75.8 kg). Social History /Family History/Past Medical History reviewed in detail and updated in EMR if needed.   Review of Systems  Constitutional: Negative for fatigue and fever.  HENT: Negative for ear pain.   Eyes: Negative for pain.  Respiratory: Negative for cough and shortness of breath.   Cardiovascular: Negative for chest pain, palpitations and leg swelling.  Gastrointestinal: Negative for abdominal pain.  Genitourinary: Negative for dysuria.  Musculoskeletal: Negative for arthralgias.  Neurological: Negative for syncope, light-headedness and headaches.  Psychiatric/Behavioral: Negative for dysphoric mood.       Objective:   Physical Exam  Constitutional: He appears well-developed and well-nourished.  Non-toxic appearance. He does not appear ill. No distress.  HENT:  Head: Normocephalic and atraumatic.  Right Ear: Hearing, tympanic membrane, external ear and ear canal normal.  Left Ear: Hearing, tympanic membrane, external ear and ear canal normal.  Nose:  Nose normal.  Mouth/Throat: Uvula is midline, oropharynx is clear and moist and mucous membranes are normal.  Eyes: Pupils are equal, round, and reactive to light. Conjunctivae, EOM and lids are normal. Lids are everted and swept, no foreign bodies found.  Neck: Trachea normal, normal range of motion and phonation normal. Neck supple. Carotid bruit is not present. No thyroid mass and no thyromegaly present.  Cardiovascular: Normal rate, regular rhythm, S1 normal, S2 normal, intact distal pulses and normal pulses. Exam reveals no gallop.  No murmur heard. Pulmonary/Chest: Breath sounds normal. He has no wheezes. He has no rhonchi. He has no rales.  Abdominal: Soft. Normal appearance and bowel sounds are normal. There is no hepatosplenomegaly. There is no tenderness. There is no rebound, no guarding and no CVA tenderness. No hernia.  Lymphadenopathy:    He has no cervical adenopathy.  Neurological: He is alert. He has normal strength and normal reflexes. No cranial nerve deficit or sensory deficit. Gait normal.  Skin: Skin is warm, dry and intact. No rash noted.  Psychiatric: He has a normal mood and affect. His speech is normal and behavior is normal. Judgment normal.          Assessment & Plan:  The patient's preventative maintenance and recommended screening tests for an annual wellness exam were reviewed in full today. Brought up to date unless services declined.  Counselled on the importance of diet, exercise, and its role in overall health and mortality. The patient's FH and SH was reviewed, including their home life, tobacco status, and drug and alcohol status.  Refused HIV. Check PSA.  Uptodate with colonoscopy.Marland Kitchen Next due 2022 Hep C done  Update vaccines .

## 2018-04-15 ENCOUNTER — Other Ambulatory Visit (INDEPENDENT_AMBULATORY_CARE_PROVIDER_SITE_OTHER): Payer: PRIVATE HEALTH INSURANCE

## 2018-04-15 DIAGNOSIS — R7303 Prediabetes: Secondary | ICD-10-CM | POA: Diagnosis not present

## 2018-04-15 DIAGNOSIS — E785 Hyperlipidemia, unspecified: Secondary | ICD-10-CM | POA: Diagnosis not present

## 2018-04-15 DIAGNOSIS — M546 Pain in thoracic spine: Secondary | ICD-10-CM | POA: Diagnosis not present

## 2018-04-15 DIAGNOSIS — Z125 Encounter for screening for malignant neoplasm of prostate: Secondary | ICD-10-CM | POA: Diagnosis not present

## 2018-04-15 DIAGNOSIS — M5114 Intervertebral disc disorders with radiculopathy, thoracic region: Secondary | ICD-10-CM | POA: Diagnosis not present

## 2018-04-15 LAB — COMPREHENSIVE METABOLIC PANEL
ALBUMIN: 4.9 g/dL (ref 3.5–5.2)
ALK PHOS: 54 U/L (ref 39–117)
ALT: 48 U/L (ref 0–53)
AST: 27 U/L (ref 0–37)
BUN: 21 mg/dL (ref 6–23)
CALCIUM: 10.1 mg/dL (ref 8.4–10.5)
CHLORIDE: 90 meq/L — AB (ref 96–112)
CO2: 27 mEq/L (ref 19–32)
Creatinine, Ser: 1.27 mg/dL (ref 0.40–1.50)
GFR: 61.93 mL/min (ref >60.00)
Glucose, Bld: 126 mg/dL — ABNORMAL HIGH (ref 70–99)
Potassium: 4.6 mEq/L (ref 3.5–5.1)
Sodium: 127 mEq/L — ABNORMAL LOW (ref 135–145)
TOTAL PROTEIN: 7.7 g/dL (ref 6.0–8.3)
Total Bilirubin: 0.7 mg/dL (ref 0.2–1.2)

## 2018-04-15 LAB — LIPID PANEL
Cholesterol: 163 mg/dL (ref 0–200)
HDL: 81.1 mg/dL (ref >39.00)
LDL Cholesterol: 69 mg/dL (ref 0–99)
NONHDL: 82.22
Total CHOL/HDL Ratio: 2
Triglycerides: 68 mg/dL (ref 0.0–149.0)
VLDL: 13.6 mg/dL (ref 0.0–40.0)

## 2018-04-15 LAB — HEMOGLOBIN A1C: HEMOGLOBIN A1C: 5.7 % (ref 4.6–6.5)

## 2018-04-15 LAB — PSA: PSA: 0.82 ng/mL (ref 0.10–4.00)

## 2018-04-16 ENCOUNTER — Telehealth: Payer: Self-pay | Admitting: Family Medicine

## 2018-04-16 DIAGNOSIS — E871 Hypo-osmolality and hyponatremia: Secondary | ICD-10-CM

## 2018-04-16 MED ORDER — BENAZEPRIL HCL 20 MG PO TABS: 20.0000 mg | ORAL_TABLET | Freq: Every day | ORAL | 11 refills | Status: DC

## 2018-04-16 NOTE — Telephone Encounter (Signed)
Make sure pt following BP on benazapril  without out HCTZ. Sent in to pharmacy.  Goal BP < 140/90.  tell him to not restrict salt intake as well.  Labs entered.

## 2018-04-16 NOTE — Telephone Encounter (Signed)
Patient advised.  Lab appt scheduled.  

## 2018-04-22 ENCOUNTER — Other Ambulatory Visit (INDEPENDENT_AMBULATORY_CARE_PROVIDER_SITE_OTHER): Payer: PRIVATE HEALTH INSURANCE

## 2018-04-22 DIAGNOSIS — E871 Hypo-osmolality and hyponatremia: Secondary | ICD-10-CM | POA: Diagnosis not present

## 2018-04-22 LAB — BASIC METABOLIC PANEL
BUN: 13 mg/dL (ref 6–23)
CHLORIDE: 98 meq/L (ref 96–112)
CO2: 29 mEq/L (ref 19–32)
Calcium: 10 mg/dL (ref 8.4–10.5)
Creatinine, Ser: 1.09 mg/dL (ref 0.40–1.50)
GFR: 73.87 mL/min (ref >60.00)
Glucose, Bld: 89 mg/dL (ref 70–99)
POTASSIUM: 4.6 meq/L (ref 3.5–5.1)
SODIUM: 135 meq/L (ref 135–145)

## 2018-05-11 NOTE — Telephone Encounter (Addendum)
Pt left v/m that pt had been taking the Benazepril 20 mg once daily that Dr Diona Browner had sent in for pt;  Pt just took BP; BP 179/107. pts last annual on 04/14/18; please see 04/16/18 phone note. Pt took Benazepril HCTZ 20-25 mg this morning instead of Benazepril 20 mg. Pt has had slight h/a; nothing bad; no other symptoms. [facility]. Pt request cb to adjust BP medication.

## 2018-05-12 NOTE — Addendum Note (Signed)
Addended by: Carter Kitten on: 05/12/2018 10:11 AM   Modules accepted: Orders

## 2018-05-12 NOTE — Telephone Encounter (Signed)
Have him take 2 tabs of benazepril for a total of 40 mg at a time. If BP is improved.. Call for higher dose tablet. Change in med list please.

## 2018-05-12 NOTE — Telephone Encounter (Addendum)
Shanon Brow notified as instructed by telephone.  Patient states understanding.  Medication list updated.

## 2018-05-14 MED ORDER — BENAZEPRIL HCL 40 MG PO TABS: 40.0000 mg | ORAL_TABLET | Freq: Every day | ORAL | 1 refills | Status: DC

## 2018-05-14 MED ORDER — AMLODIPINE BESYLATE 5 MG PO TABS: 5.0000 mg | ORAL_TABLET | Freq: Every day | ORAL | 1 refills | Status: DC

## 2018-05-14 NOTE — Telephone Encounter (Signed)
Pt has been taking Benazepril 40 mg at 8 AM but pt thinks BP still too high; in early AM BP 160/100 and then throughout the day BP goes down to low 140's/ low to mid 90s. Pt is not having any symptoms, No H/A, dizziness, CP or SOB.  Pt took med this morning but is out of med and if med is going to be changed needs new med sent to [facility]. Pt request cb today.

## 2018-05-14 NOTE — Addendum Note (Signed)
Addended by: Eliezer Lofts E on: 05/14/2018 11:44 AM   Modules accepted: Orders

## 2018-05-14 NOTE — Telephone Encounter (Signed)
Shanon Brow notified as instructed by telephone.  He would like to increase the amlodipine to 5 mg daily.  He is assuming we are going to keep the benazepril at 40 mg daily.  I advised to continue benazepril at 40 mg daily.  He will also need to prescription for that as well.  Refills sent to [facility].

## 2018-05-14 NOTE — Telephone Encounter (Signed)
Perfect, thank you

## 2018-05-14 NOTE — Telephone Encounter (Signed)
HCTZ  Was stopped due to low sodium... BP seems to have been better controlled on benzapril, amlodipine, metoprolol and HCTZ.  We can either try a trial back on HCTZ with no sodium limitation or instead he could stay off HCTZ and instead increase amlodipine to 5 mg daily.  Call pt.. Which option does her prefer?

## 2018-05-14 NOTE — Addendum Note (Signed)
Addended by: Carter Kitten on: 05/14/2018 12:01 PM   Modules accepted: Orders

## 2018-05-18 ENCOUNTER — Telehealth: Payer: Self-pay | Admitting: *Deleted

## 2018-05-18 NOTE — Telephone Encounter (Signed)
Spoke to pt who wanted to provide a BP update. Pt states he increased both the amlodipine and benazepril as directed and his BP readings are still 160s/100s. Pt denies any HA, dizziness or ShoB. Pt states that he feels a lot better than he did when he was taking a diuretic, but is wanting to know how to proceed. pls advise

## 2018-05-19 ENCOUNTER — Telehealth: Payer: Self-pay | Admitting: Family Medicine

## 2018-05-19 DIAGNOSIS — Z0279 Encounter for issue of other medical certificate: Secondary | ICD-10-CM

## 2018-05-19 NOTE — Telephone Encounter (Signed)
Shanon Brow notified as instructed by telephone.  Appointment scheduled for 05/21/2018 at 10:45 am with Dr. Diona Browner.

## 2018-05-19 NOTE — Telephone Encounter (Signed)
Have him make an appointment for BP check and re-eval  Thursday

## 2018-05-19 NOTE — Telephone Encounter (Signed)
[employer] faxed short term disability forms. 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  This is for his bp  Paperwork in dr Diona Browner in box For review and signature

## 2018-05-21 ENCOUNTER — Ambulatory Visit (INDEPENDENT_AMBULATORY_CARE_PROVIDER_SITE_OTHER): Payer: Federal, State, Local not specified - PPO | Admitting: Family Medicine

## 2018-05-21 ENCOUNTER — Encounter: Payer: Self-pay | Admitting: Family Medicine

## 2018-05-21 DIAGNOSIS — I10 Essential (primary) hypertension: Secondary | ICD-10-CM | POA: Diagnosis not present

## 2018-05-21 NOTE — Patient Instructions (Signed)
Follow BPs at home. Continue current regimen.

## 2018-05-21 NOTE — Telephone Encounter (Signed)
Paperwork faxed 12/10 Copy for pt Copy for scan Copy for billing

## 2018-05-21 NOTE — Progress Notes (Signed)
   Subjective:    Patient ID: Devin Stanton, male    DOB: 05-30-1961, 57 y.o.   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   Hyponatremia.Marland Kitchen resolved off HCTZ Salt was 135 when rechecked.   Hypertension:  Improved  Control now on current regimen. BP Readings from Last 3 Encounters:  05/21/18 129/72  04/14/18 124/85  06/19/17 140/80     Using medication without problems or lightheadedness:  none Chest pain with exertion:none Edema:none Short of breath:none Average home BPs: Now in last week  130/85140/90s Other issues:   Feels better off HTCZ.  On benazapril max and amlodipine 5 mg  No increased stress,  Caffeine or ETOh.     Kidney function normal.   Blood pressure 129/72, pulse 80, temperature 98.5 F (36.9 C), temperature source Oral, height 5' 7.5" (1.715 m), weight 172 lb 8 oz (78.2 kg). Social History /Family History/Past Medical History reviewed in detail and updated in EMR if needed.   Review of Systems  Constitutional: Negative for fatigue and fever.  HENT: Negative for ear pain.   Eyes: Negative for pain.  Respiratory: Negative for cough and shortness of breath.   Cardiovascular: Negative for chest pain, palpitations and leg swelling.  Gastrointestinal: Negative for abdominal pain.  Genitourinary: Negative for dysuria.  Musculoskeletal: Negative for arthralgias.  Neurological: Negative for syncope, light-headedness and headaches.  Psychiatric/Behavioral: Negative for dysphoric mood.       Objective:   Physical Exam Constitutional:      Appearance: He is well-developed.  HENT:     Head: Normocephalic.     Right Ear: Hearing normal.     Left Ear: Hearing normal.     Nose: Nose normal.  Neck:     Thyroid: No thyroid mass or thyromegaly.     Vascular: No carotid bruit.     Trachea: Trachea normal.  Cardiovascular:     Rate and Rhythm: Normal rate and regular rhythm.     Pulses: Normal  pulses.     Heart sounds: Heart sounds not distant. No murmur. No friction rub. No gallop.      Comments: No peripheral edema Pulmonary:     Effort: Pulmonary effort is normal. No respiratory distress.     Breath sounds: Normal breath sounds.  Skin:    General: Skin is warm and dry.     Findings: No rash.  Psychiatric:        Speech: Speech normal.        Behavior: Behavior normal.        Thought Content: Thought content normal.           Assessment & Plan:

## 2018-05-21 NOTE — Assessment & Plan Note (Signed)
Now improved on benazapril amlodipine.  Feels better off HCTZ and hyponatremia resolved.

## 2018-07-20 ENCOUNTER — Encounter: Payer: Self-pay | Admitting: Internal Medicine

## 2018-07-20 ENCOUNTER — Ambulatory Visit: Payer: Federal, State, Local not specified - PPO | Admitting: Internal Medicine

## 2018-07-20 ENCOUNTER — Ambulatory Visit (INDEPENDENT_AMBULATORY_CARE_PROVIDER_SITE_OTHER)
Admission: RE | Admit: 2018-07-20 | Discharge: 2018-07-20 | Disposition: A | Discharge status: Home / Self Care | Payer: PRIVATE HEALTH INSURANCE | Source: Ambulatory Visit | Attending: Internal Medicine | Admitting: Internal Medicine

## 2018-07-20 ENCOUNTER — Other Ambulatory Visit: Payer: Self-pay | Admitting: Internal Medicine

## 2018-07-20 VITALS — BP 130/84 | HR 78 | Temp 98.1°F | Wt 173.0 lb

## 2018-07-20 DIAGNOSIS — R0981 Nasal congestion: Secondary | ICD-10-CM

## 2018-07-20 DIAGNOSIS — R05 Cough: Secondary | ICD-10-CM

## 2018-07-20 DIAGNOSIS — R059 Cough, unspecified: Secondary | ICD-10-CM

## 2018-07-20 MED ORDER — PREDNISONE 10 MG PO TABS: ORAL_TABLET | ORAL | 0 refills | Status: DC

## 2018-07-20 NOTE — Progress Notes (Signed)
HPI  Pt presents to the clinic today with c/o nasal congestion and cough. He reports the cough started 6 weeks ago. The cough is productive of clear mucous. He started with nasal congestion yesterday. He is blowing clear mucous out of his nose. He denies ear pain, sore throat or shortness of breath. He denies fever, chills or body aches. He has tried cough syrup OTC with some relief. He has no history of allergies or asthma. He has had sick contacts. He is on Benazepril for HTN, but has been on this for a while without noticing a cough. He denies reflux or heartburn.  Review of Systems      Past Medical History:  Diagnosis Date  . Backache, unspecified   . ED (erectile dysfunction)   . History of kidney stones 1995   "passed them" (04/08/2016)  . Hyperlipidemia    controlled medically  . Unspecified essential hypertension    controlled  . Unspecified personal history presenting hazards to health     Family History  Problem Relation Age of Onset  . Parkinsonism Mother   . Macular degeneration Mother   . Leukemia Father   . Coronary artery disease Father   . Heart disease Brother 35       CABG x 5  . Heart disease Brother   . Stroke Brother 76  . Colon cancer Neg Hx   . Esophageal cancer Neg Hx   . Stomach cancer Neg Hx     Social History   Socioeconomic History  . Marital status: Married    Spouse name: Not on file  . Number of children: 3  . Years of education: 2  . Highest education level: Not on file  Occupational History  . Occupation: Administrator, Civil Service: [employer]  . Financial resource strain: Not on file  . Food insecurity:    Worry: Not on file    Inability: Not on file  . Transportation needs:    Medical: Not on file    Non-medical: Not on file  Tobacco Use  . Smoking status: Never Smoker  . Smokeless tobacco: Current User    Types: Snuff  Substance and Sexual Activity  . Alcohol use: Yes    Alcohol/week: 8.0 standard drinks   Types: 4 Standard drinks or equivalent, 4 Shots of liquor per week  . Drug use: No  . Sexual activity: Yes    Partners: Female  Lifestyle  . Physical activity:    Days per week: Not on file    Minutes per session: Not on file  . Stress: Not on file  Relationships  . Social connections:    Talks on phone: Not on file    Gets together: Not on file    Attends religious service: Not on file    Active member of club or organization: Not on file    Attends meetings of clubs or organizations: Not on file    Relationship status: Not on file  . Intimate partner violence:    Fear of current or ex partner: Not on file    Emotionally abused: Not on file    Physically abused: Not on file    Forced sexual activity: Not on file  Other Topics Concern  . 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 2nd marriage- '06-08, 3rd marriage 12/10   1 son- '45,step son '91, step daughter '93.  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    No Known Allergies   Constitutional: Denies headache, fatigue, fever or abrupt weight changes.  HEENT:  Positive nasal congestion. Denies eye redness, eye pain, pressure behind the eyes, facial pain, ear pain, ringing in the ears, wax buildup, runny nose or sore throat. Respiratory: Positive cough. Denies difficulty breathing or shortness of breath.  Cardiovascular: Denies chest pain, chest tightness, palpitations or swelling in the hands or feet.   No other specific complaints in a complete review of systems (except as listed in HPI above).  Objective:   BP 130/84   Pulse 78   Temp 98.1 F (36.7 C) (Oral)   Wt 173 lb (78.5 kg)   SpO2 97%   BMI 26.70 kg/m  Wt Readings from Last 3 Encounters:  07/20/18 173 lb (78.5 kg)  05/21/18 172 lb 8 oz (78.2 kg)  04/14/18 167 lb (75.8 kg)     General: Appears his stated age, well developed, well  nourished in NAD. HEENT: Head: normal shape and size, no sinus tenderness noted; Ears: Tm's gray and intact, normal light reflex; Nose: mucosa boggy and moist, septum midline; Throat/Mouth: + PND. Teeth present, mucosa erythematous and moist, no exudate noted, no lesions or ulcerations noted.  Neck: No cervical lymphadenopathy.  Cardiovascular: Normal rate and rhythm. S1,S2 noted.  No murmur, rubs or gallops noted.  Pulmonary/Chest: Normal effort and positive vesicular breath sounds. No respiratory distress. No wheezes, rales or ronchi noted.       Assessment & Plan:   Nasal Congestion, Cough:  Exam benign Get some rest and drink plenty of water Chest xray for further evaluation Consider Pred Taper if xray normal Continue OTC cough syrup  RTC as needed or if symptoms persist.   Webb Silversmith, NP

## 2018-07-20 NOTE — Patient Instructions (Signed)

## 2018-07-22 ENCOUNTER — Encounter: Payer: Self-pay | Admitting: Internal Medicine

## 2018-07-23 MED ORDER — HYDROCOD POLST-CPM POLST ER 10-8 MG/5ML PO SUER: 5.0000 mL | Freq: Three times a day (TID) | ORAL | 0 refills | Status: DC | PRN

## 2018-07-24 MED ORDER — AZITHROMYCIN 250 MG PO TABS: ORAL_TABLET | ORAL | 0 refills | Status: DC

## 2018-07-24 NOTE — Addendum Note (Signed)
Addended by: Lurlean Nanny on: 07/24/2018 04:43 PM   Modules accepted: Orders

## 2018-07-29 DIAGNOSIS — D225 Melanocytic nevi of trunk: Secondary | ICD-10-CM | POA: Diagnosis not present

## 2018-07-29 DIAGNOSIS — L570 Actinic keratosis: Secondary | ICD-10-CM | POA: Diagnosis not present

## 2018-07-29 DIAGNOSIS — X32XXXA Exposure to sunlight, initial encounter: Secondary | ICD-10-CM | POA: Diagnosis not present

## 2018-07-29 DIAGNOSIS — Z85828 Personal history of other malignant neoplasm of skin: Secondary | ICD-10-CM | POA: Diagnosis not present

## 2018-07-29 DIAGNOSIS — D2262 Melanocytic nevi of left upper limb, including shoulder: Secondary | ICD-10-CM | POA: Diagnosis not present

## 2018-07-29 DIAGNOSIS — D2261 Melanocytic nevi of right upper limb, including shoulder: Secondary | ICD-10-CM | POA: Diagnosis not present

## 2018-07-30 ENCOUNTER — Encounter: Payer: Self-pay | Admitting: Family Medicine

## 2018-07-30 ENCOUNTER — Ambulatory Visit: Payer: Federal, State, Local not specified - PPO | Admitting: Family Medicine

## 2018-07-30 VITALS — BP 132/80 | HR 82 | Temp 98.6°F | Ht 67.5 in | Wt 172.0 lb

## 2018-07-30 DIAGNOSIS — R05 Cough: Secondary | ICD-10-CM | POA: Diagnosis not present

## 2018-07-30 DIAGNOSIS — R059 Cough, unspecified: Secondary | ICD-10-CM | POA: Insufficient documentation

## 2018-07-30 MED ORDER — PREDNISONE 20 MG PO TABS: ORAL_TABLET | ORAL | 0 refills | Status: DC

## 2018-07-30 MED ORDER — DOXYCYCLINE HYCLATE 100 MG PO TABS: 100.0000 mg | ORAL_TABLET | Freq: Two times a day (BID) | ORAL | 0 refills | Status: DC

## 2018-07-30 MED ORDER — ALBUTEROL SULFATE HFA 108 (90 BASE) MCG/ACT IN AERS: 2.0000 | INHALATION_SPRAY | Freq: Four times a day (QID) | RESPIRATORY_TRACT | 0 refills | Status: DC | PRN

## 2018-07-30 NOTE — Patient Instructions (Signed)
Will broaden antibiotics and try higher dose prednisone course.  Use albuterol as needed for wheeze.  Rest and fluids.

## 2018-07-30 NOTE — Assessment & Plan Note (Signed)
Bronchitis and reactive airway component. No clear focal consolidation.  Will broaden antibiotics and treat with higher dose prednisone taper and albuterol prn

## 2018-07-30 NOTE — Progress Notes (Signed)
   Subjective:    Patient ID: Devin Stanton, male    DOB: Oct 12, 1960, 58 y.o.   MRN: [id number]  HPI  58 year old male presents for follow up cough.  Started with sinus pressure and congestion.  Given amoxicillin, prednisone in 05/2018  Moved to chest  Saw [facility] on 2/10 reported chronic cough x 6 weeks. CXR:  Negative on 07/20/2017  Given Azithromycin, Prednisone taper and cough syrup recommended.   Chest tightness in right chest, decrease SOB, postnasal drip, cough productive green.  No fever in last week ( did get to 102 prior to 2/10 appt), no myalgia,>40 minutes spent in face to face time with patient, >50% spent in counselling or coordination of care    No sick contacts.  Nonsmoker Has been using grandson's nebulizer which helps temporarily.   Increased Benazapril to max in end of 2019.  Social History /Family History/Past Medical History reviewed in detail and updated in EMR if needed. Blood pressure 132/80, pulse 82, temperature 98.6 F (37 C), temperature source Oral, height 5' 7.5" (1.715 m), weight 172 lb (78 kg), SpO2 96 %.  Review of Systems     Objective:   Physical Exam        Assessment & Plan:

## 2018-11-04 ENCOUNTER — Other Ambulatory Visit: Payer: Self-pay | Admitting: Family Medicine

## 2019-01-22 DIAGNOSIS — R200 Anesthesia of skin: Secondary | ICD-10-CM | POA: Insufficient documentation

## 2019-01-29 ENCOUNTER — Telehealth: Payer: Self-pay | Admitting: Family Medicine

## 2019-01-29 NOTE — Telephone Encounter (Signed)
Pt put in following request via mychart. He is not due for his physical until after 11/5. How would you like this to be scheduled? See request below:  Annual physical AND the [employer] is requiring me to have an additional blood pressure screening. I need 3 tests within a 7 day period and a subsequent letter to the [facility] with the results. This has to be back to the [facility] NLT 23 March 2019. Sorry for the late notice. We just moved and the change of address caused a delay in my reception of the letter from the [facility].

## 2019-01-29 NOTE — Telephone Encounter (Signed)
Call to schedule CPX with fasting labs when due pr earlier if pt needs. Can we have him do BP checks via nurse visit?

## 2019-02-01 NOTE — Telephone Encounter (Signed)
Pt is scheduled for physical on 11/12. I scheduled 3 nurse visits in September for "BP check, see Butch Penny". Is this ok?

## 2019-02-01 NOTE — Telephone Encounter (Signed)
That's fine

## 2019-02-10 ENCOUNTER — Telehealth: Payer: Self-pay | Admitting: *Deleted

## 2019-02-10 NOTE — Telephone Encounter (Signed)
Left message on voicemail for patient to call back. When patient returns call confirm nurse visit tomorrow 02/11/19, curbside and perform a covid screening.

## 2019-02-11 ENCOUNTER — Ambulatory Visit (INDEPENDENT_AMBULATORY_CARE_PROVIDER_SITE_OTHER): Payer: PRIVATE HEALTH INSURANCE

## 2019-02-11 ENCOUNTER — Other Ambulatory Visit: Payer: Self-pay

## 2019-02-11 VITALS — BP 166/92 | HR 74

## 2019-02-11 DIAGNOSIS — I10 Essential (primary) hypertension: Secondary | ICD-10-CM

## 2019-02-11 NOTE — Progress Notes (Signed)
Per Dr Diona Browner encounter order on 01/29/2019, patient presents today for a nurse visit blood pressure check for ongoing follow up and management.  Vital Sign Readings today B/P 158/94, Pulse 74-see additional information in the vitals section.  Patient is to follow up with virtual visit with Dr Diona Browner tomorrow on 02/12/2019. Patient denies any symptoms of concern including chest pain, headache, dizziness, blurred vision, etc. Spoke with Dr Diona Browner about all of this.

## 2019-02-12 ENCOUNTER — Encounter: Payer: Self-pay | Admitting: Family Medicine

## 2019-02-12 ENCOUNTER — Ambulatory Visit (INDEPENDENT_AMBULATORY_CARE_PROVIDER_SITE_OTHER): Payer: PRIVATE HEALTH INSURANCE | Admitting: Family Medicine

## 2019-02-12 DIAGNOSIS — I10 Essential (primary) hypertension: Secondary | ICD-10-CM

## 2019-02-12 NOTE — Progress Notes (Signed)
VIRTUAL VISIT Due to national recommendations of social distancing due to [place] 19, a virtual visit is felt to be most appropriate for this patient at this time.   I connected with the patient on 02/12/19 at 11:00 AM EDT by virtual telehealth platform and verified that I am speaking with the correct person using two identifiers.   I discussed the limitations, risks, security and privacy concerns of performing an evaluation and management service by  virtual telehealth platform and the availability of in person appointments. I also discussed with the patient that there may be a patient responsible charge related to this service. The patient expressed understanding and agreed to proceed.  Patient location: Home Provider Location: [facility] Participants: Eliezer Lofts and Marya Fossa   Chief Complaint  Patient presents with  . Hypertension    History of Present Illness: 58 year old male presents for follow up HTN.   He needs to have BP at goal for 3 measurements records per [facility] requirements. When he came in for first BP measurement recording yesterday.. it was high. BP Readings from Last 3 Encounters:  02/11/19 (!) 166/92  07/30/18 132/80  07/20/18 130/84   On metoprolol 50 Xl, amlodipine 5 mg and benazepril 40 mg daily  he has been under more stress lately moving. No weight gain, low salt diet. Using medication without problems or lightheadedness: none Chest pain with exertion:none Edema:none Short of breath:none Average home BPs: Other issues:   Pulse 74.  COVID 19 screen No recent travel or known exposure to COVID19 The patient denies respiratory symptoms of COVID 19 at this time.  The importance of social distancing was discussed today.   Review of Systems  Constitutional: Negative for chills and fever.  HENT: Negative for congestion and ear pain.   Eyes: Negative for pain and redness.  Respiratory: Negative for cough and shortness of breath.    Cardiovascular: Negative for chest pain, palpitations and leg swelling.  Gastrointestinal: Negative for abdominal pain, blood in stool, constipation, diarrhea, nausea and vomiting.  Genitourinary: Negative for dysuria.  Musculoskeletal: Negative for falls and myalgias.  Skin: Negative for rash.  Neurological: Negative for dizziness.  Psychiatric/Behavioral: Negative for depression. The patient is not nervous/anxious.       Past Medical History:  Diagnosis Date  . Backache, unspecified   . ED (erectile dysfunction)   . History of kidney stones 1995   "passed them" (04/08/2016)  . Hyperlipidemia    controlled medically  . Unspecified essential hypertension    controlled  . Unspecified personal history presenting hazards to health     reports that he has never smoked. His smokeless tobacco use includes snuff. He reports current alcohol use of about 8.0 standard drinks of alcohol per week. He reports that he does not use drugs.   Current Outpatient Medications:  .  albuterol (PROVENTIL HFA;VENTOLIN HFA) 108 (90 Base) MCG/ACT inhaler, Inhale 2 puffs into the lungs every 6 (six) hours as needed for wheezing or shortness of breath., Disp: 1 Inhaler, Rfl: 0 .  amLODipine (NORVASC) 5 MG tablet, Take 1 tablet (5 mg total) by mouth daily., Disp: 90 tablet, Rfl: 1 .  aspirin 81 MG tablet, Take 81 mg by mouth daily., Disp: , Rfl:  .  benazepril (LOTENSIN) 40 MG tablet, TAKE 1 TABLET BY MOUTH DAILY., Disp: 90 tablet, Rfl: 1 .  fluticasone (CUTIVATE) 0.005 % ointment, Apply 1 application topically 2 (two) times daily., Disp: 30 g, Rfl: 0 .  metoprolol succinate (  TOPROL-XL) 50 MG 24 hr tablet, Take 1 tablet (50 mg total) by mouth daily. Take with or immediately following a meal., Disp: 90 tablet, Rfl: 3 .  rosuvastatin (CRESTOR) 10 MG tablet, Take 1 tablet (10 mg total) by mouth daily., Disp: 90 tablet, Rfl: 3   Observations/Objective: Height 5' 7.5" (1.715 m).  Physical Exam  Physical  Exam Constitutional:      General: The patient is not in acute distress. Pulmonary:     Effort: Pulmonary effort is normal. No respiratory distress.  Neurological:     Mental Status: The patient is alert and oriented to person, place, and time.  Psychiatric:        Mood and Affect: Mood normal.        Behavior: Behavior normal.   Assessment and Plan Essential hypertension, benign Increase amlodipine to 10 mg daily.  Work on stress reduction, low salt diet, weight loss and exercise.  Set up in 2 weeks.. repeat BP check nurse visit   I discussed the assessment and treatment plan with the patient. The patient was provided an opportunity to ask questions and all were answered. The patient agreed with the plan and demonstrated an understanding of the instructions.   The patient was advised to call back or seek an in-person evaluation if the symptoms worsen or if the condition fails to improve as anticipated.     Eliezer Lofts, MD

## 2019-02-12 NOTE — Progress Notes (Signed)
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

## 2019-02-12 NOTE — Assessment & Plan Note (Signed)
Increase amlodipine to 10 mg daily.  Work on stress reduction, low salt diet, weight loss and exercise.  Set up in 2 weeks.. repeat BP check nurse visit

## 2019-02-12 NOTE — Patient Instructions (Addendum)
Increase amlodipine to 2 tabs daily for total of 10 mg daily. Continue other meds.  Work on stress reduction, low salt diet, weight loss and exercise.  Set up in 2 weeks.. repeat BP check nurse visit

## 2019-02-17 ENCOUNTER — Ambulatory Visit: Payer: Federal, State, Local not specified - PPO

## 2019-02-18 ENCOUNTER — Ambulatory Visit: Payer: Federal, State, Local not specified - PPO

## 2019-03-02 ENCOUNTER — Ambulatory Visit (INDEPENDENT_AMBULATORY_CARE_PROVIDER_SITE_OTHER): Payer: PRIVATE HEALTH INSURANCE | Admitting: *Deleted

## 2019-03-02 VITALS — BP 106/68 | HR 86

## 2019-03-02 DIAGNOSIS — I10 Essential (primary) hypertension: Secondary | ICD-10-CM | POA: Diagnosis not present

## 2019-03-02 NOTE — Progress Notes (Signed)
Per Dr. Eliezer Lofts encounter order on 03/02/2019, patient presents today for a nurse visit blood pressure check for ongoing follow up and management.  Vital Sign Readings today BP 106/68 Pulse 86/  Patient is to return to office tomorrow for another BP check per Dr. Diona Browner.

## 2019-03-03 ENCOUNTER — Ambulatory Visit (INDEPENDENT_AMBULATORY_CARE_PROVIDER_SITE_OTHER): Payer: PRIVATE HEALTH INSURANCE

## 2019-03-03 ENCOUNTER — Other Ambulatory Visit: Payer: Self-pay

## 2019-03-03 VITALS — BP 136/78 | HR 82

## 2019-03-03 DIAGNOSIS — I10 Essential (primary) hypertension: Secondary | ICD-10-CM

## 2019-03-03 NOTE — Progress Notes (Signed)
Per Dr Diona Browner encounter order on 02/12/2019, patient presents today for a nurse visit blood pressure check for ongoing follow up and management.  Vital Sign Readings today B/P 136/78 and Pulse 82.  Patient is to return for another Nurse visit for b/p check on 03/05/2019.

## 2019-03-05 ENCOUNTER — Ambulatory Visit (INDEPENDENT_AMBULATORY_CARE_PROVIDER_SITE_OTHER): Payer: PRIVATE HEALTH INSURANCE | Admitting: *Deleted

## 2019-03-05 VITALS — BP 124/78 | HR 76

## 2019-03-05 DIAGNOSIS — I10 Essential (primary) hypertension: Secondary | ICD-10-CM

## 2019-03-05 NOTE — Progress Notes (Signed)
Per Dr. Diona Browner encounter order on 03/05/2019, patient presents today for a nurse visit blood pressure check for ongoing follow up and management.  Vital Sign Readings today BP 124 78 pulse 76.  Patient is to continue current treatment per Dr. Diona Browner.

## 2019-03-09 ENCOUNTER — Ambulatory Visit: Payer: PRIVATE HEALTH INSURANCE

## 2019-04-22 ENCOUNTER — Encounter: Payer: Federal, State, Local not specified - PPO | Admitting: Family Medicine

## 2019-04-22 ENCOUNTER — Encounter: Payer: Self-pay | Admitting: Family Medicine

## 2019-04-23 ENCOUNTER — Other Ambulatory Visit: Payer: Self-pay

## 2019-04-23 ENCOUNTER — Ambulatory Visit (INDEPENDENT_AMBULATORY_CARE_PROVIDER_SITE_OTHER): Payer: PRIVATE HEALTH INSURANCE | Admitting: Family Medicine

## 2019-04-23 ENCOUNTER — Encounter: Payer: Self-pay | Admitting: Family Medicine

## 2019-04-23 VITALS — BP 130/84 | HR 80 | Temp 98.0°F | Ht 67.5 in | Wt 172.0 lb

## 2019-04-23 DIAGNOSIS — Z0000 Encounter for general adult medical examination without abnormal findings: Secondary | ICD-10-CM

## 2019-04-23 DIAGNOSIS — E785 Hyperlipidemia, unspecified: Secondary | ICD-10-CM

## 2019-04-23 DIAGNOSIS — R7303 Prediabetes: Secondary | ICD-10-CM

## 2019-04-23 DIAGNOSIS — I10 Essential (primary) hypertension: Secondary | ICD-10-CM

## 2019-04-23 DIAGNOSIS — Z125 Encounter for screening for malignant neoplasm of prostate: Secondary | ICD-10-CM

## 2019-04-23 LAB — LIPID PANEL
Cholesterol: 182 mg/dL (ref 0–200)
HDL: 80.6 mg/dL (ref >39.00)
LDL Cholesterol: 84 mg/dL (ref 0–99)
NonHDL: 101.42
Total CHOL/HDL Ratio: 2
Triglycerides: 85 mg/dL (ref 0.0–149.0)
VLDL: 17 mg/dL (ref 0.0–40.0)

## 2019-04-23 LAB — COMPREHENSIVE METABOLIC PANEL
ALT: 95 U/L — ABNORMAL HIGH (ref 0–53)
AST: 53 U/L — ABNORMAL HIGH (ref 0–37)
Albumin: 4.9 g/dL (ref 3.5–5.2)
Alkaline Phosphatase: 73 U/L (ref 39–117)
BUN: 19 mg/dL (ref 6–23)
CO2: 27 mEq/L (ref 19–32)
Calcium: 10.1 mg/dL (ref 8.4–10.5)
Chloride: 99 mEq/L (ref 96–112)
Creatinine, Ser: 1.05 mg/dL (ref 0.40–1.50)
GFR: 72.31 mL/min (ref >60.00)
Glucose, Bld: 105 mg/dL — ABNORMAL HIGH (ref 70–99)
Potassium: 5 mEq/L (ref 3.5–5.1)
Sodium: 136 mEq/L (ref 135–145)
Total Bilirubin: 0.7 mg/dL (ref 0.2–1.2)
Total Protein: 7.5 g/dL (ref 6.0–8.3)

## 2019-04-23 LAB — PSA, MEDICARE: PSA: 1.21 ng/ml (ref 0.10–4.00)

## 2019-04-23 LAB — HEMOGLOBIN A1C: Hgb A1c MFr Bld: 5.6 % (ref 4.6–6.5)

## 2019-04-23 MED ORDER — AMLODIPINE BESYLATE 10 MG PO TABS: 10.0000 mg | ORAL_TABLET | Freq: Every day | ORAL | 3 refills | Status: DC

## 2019-04-23 MED ORDER — ROSUVASTATIN CALCIUM 10 MG PO TABS: 10.0000 mg | ORAL_TABLET | Freq: Every day | ORAL | 3 refills | Status: DC

## 2019-04-23 MED ORDER — METOPROLOL SUCCINATE ER 50 MG PO TB24: 50.0000 mg | ORAL_TABLET | Freq: Every day | ORAL | 3 refills | Status: DC

## 2019-04-23 MED ORDER — BENAZEPRIL HCL 40 MG PO TABS: 40.0000 mg | ORAL_TABLET | Freq: Every day | ORAL | 3 refills | Status: DC

## 2019-04-23 NOTE — Assessment & Plan Note (Signed)
Improved control with increase to  amlodipine higher dose

## 2019-04-23 NOTE — Assessment & Plan Note (Signed)
Due for re-eval. Encouraged exercise, weight loss, healthy eating habits.

## 2019-04-23 NOTE — Assessment & Plan Note (Signed)
Due for re-eval. 

## 2019-04-23 NOTE — Patient Instructions (Signed)
Please stop at the lab to have labs drawn.  

## 2019-04-23 NOTE — Progress Notes (Signed)
Chief Complaint  Patient presents with  . Annual Exam    vision--Dr Nicki Reaper in [facility] eye care  . Flu Vaccine    History of Present Illness: HPI  The patient is here for annual wellness exam and preventative care.    Hypertension:   Today in office... good control.. pt had reported increased numbers in AMs but better on amlodipine 10 mg dialy. BP Readings from Last 3 Encounters:  04/23/19 130/84  03/05/19 124/78  03/03/19 136/78  Using medication without problems or lightheadedness: none Chest pain with exertion:none Edema:none Short of breath: none Average home BPs: Other issues:  Elevated Cholesterol:  Due for re-eval. Lab Results  Component Value Date   CHOL 163 04/15/2018   HDL 81.10 04/15/2018   LDLCALC 69 04/15/2018   LDLDIRECT 167.2 04/14/2009   TRIG 68.0 04/15/2018   CHOLHDL 2 04/15/2018  Using medications without problems: Muscle aches:  Diet compliance: good  walking daily, stays active. Other complaints:   prediabetes due for re-eval.  COVID 19 screen No recent travel or known exposure to [place] The patient denies respiratory symptoms of COVID 19 at this time.  The importance of social distancing was discussed today.   Review of Systems  Constitutional: Negative for chills and fever.  HENT: Negative for congestion and ear pain.   Eyes: Negative for pain and redness.  Respiratory: Negative for cough and shortness of breath.   Cardiovascular: Negative for chest pain, palpitations and leg swelling.  Gastrointestinal: Negative for abdominal pain, blood in stool, constipation, diarrhea, nausea and vomiting.  Genitourinary: Negative for dysuria.  Musculoskeletal: Negative for falls and myalgias.  Skin: Negative for rash.  Neurological: Negative for dizziness.  Psychiatric/Behavioral: Negative for depression. The patient is not nervous/anxious.       Past Medical History:  Diagnosis Date  . Backache, unspecified   . ED (erectile dysfunction)    . History of kidney stones 1995   "passed them" (04/08/2016)  . Hyperlipidemia    controlled medically  . Unspecified essential hypertension    controlled  . Unspecified personal history presenting hazards to health     reports that he has never smoked. His smokeless tobacco use includes snuff. He reports current alcohol use of about 8.0 standard drinks of alcohol per week. He reports that he does not use drugs.   Current Outpatient Medications:  .  albuterol (PROVENTIL HFA;VENTOLIN HFA) 108 (90 Base) MCG/ACT inhaler, Inhale 2 puffs into the lungs every 6 (six) hours as needed for wheezing or shortness of breath., Disp: 1 Inhaler, Rfl: 0 .  amLODipine (NORVASC) 10 MG tablet, Take 10 mg by mouth daily., Disp: , Rfl:  .  aspirin 81 MG tablet, Take 81 mg by mouth daily., Disp: , Rfl:  .  benazepril (LOTENSIN) 40 MG tablet, TAKE 1 TABLET BY MOUTH DAILY., Disp: 90 tablet, Rfl: 1 .  fluticasone (CUTIVATE) 0.005 % ointment, Apply 1 application topically 2 (two) times daily., Disp: 30 g, Rfl: 0 .  metoprolol succinate (TOPROL-XL) 50 MG 24 hr tablet, Take 1 tablet (50 mg total) by mouth daily. Take with or immediately following a meal., Disp: 90 tablet, Rfl: 3 .  rosuvastatin (CRESTOR) 10 MG tablet, Take 1 tablet (10 mg total) by mouth daily., Disp: 90 tablet, Rfl: 3   Observations/Objective: Blood pressure 130/84, pulse 80, temperature 98 F (36.7 C), temperature source Temporal, height 5' 7.5" (1.715 m), weight 172 lb (78 kg), SpO2 98 %.  Physical Exam Constitutional:  General: He is not in acute distress.    Appearance: Normal appearance. He is well-developed. He is not ill-appearing or toxic-appearing.  HENT:     Head: Normocephalic and atraumatic.     Right Ear: Hearing, tympanic membrane, ear canal and external ear normal.     Left Ear: Hearing, tympanic membrane, ear canal and external ear normal.     Nose: Nose normal.     Mouth/Throat:     Pharynx: Uvula midline.  Eyes:      General: Lids are normal. Lids are everted, no foreign bodies appreciated.     Conjunctiva/sclera: Conjunctivae normal.     Pupils: Pupils are equal, round, and reactive to light.  Neck:     Musculoskeletal: Normal range of motion and neck supple.     Thyroid: No thyroid mass or thyromegaly.     Vascular: No carotid bruit.     Trachea: Trachea and phonation normal.  Cardiovascular:     Rate and Rhythm: Normal rate and regular rhythm.     Pulses: Normal pulses.     Heart sounds: S1 normal and S2 normal. No murmur. No gallop.   Pulmonary:     Breath sounds: Normal breath sounds. No wheezing, rhonchi or rales.  Abdominal:     General: Bowel sounds are normal.     Palpations: Abdomen is soft.     Tenderness: There is no abdominal tenderness. There is no guarding or rebound.     Hernia: No hernia is present.  Lymphadenopathy:     Cervical: No cervical adenopathy.  Skin:    General: Skin is warm and dry.     Findings: No rash.  Neurological:     Mental Status: He is alert.     Cranial Nerves: No cranial nerve deficit.     Sensory: No sensory deficit.     Gait: Gait normal.     Deep Tendon Reflexes: Reflexes are normal and symmetric.  Psychiatric:        Speech: Speech normal.        Behavior: Behavior normal.        Judgment: Judgment normal.      Assessment and Plan   The patient's preventative maintenance and recommended screening tests for an annual wellness exam were reviewed in full today. Brought up to date unless services declined.  Counselled on the importance of diet, exercise, and its role in overall health and mortality. The patient's FH and SH was reviewed, including their home life, tobacco status, and drug and alcohol status.   Refused HIV. Check PSA... today Uptodate with colonoscopy.Marland Kitchen Next due 2022 Hep C done Update vaccines.Marland Kitchen given flu shot   Essential hypertension, benign Improved control with increase to  amlodipine higher dose    Hyperlipidemia Due for re-eval.   Eliezer Lofts, MD

## 2020-01-02 ENCOUNTER — Other Ambulatory Visit: Payer: Self-pay

## 2020-01-02 ENCOUNTER — Emergency Department: Payer: PRIVATE HEALTH INSURANCE

## 2020-01-02 ENCOUNTER — Emergency Department
Admission: EM | Admit: 2020-01-02 | Discharge: 2020-01-02 | Disposition: A | Discharge status: Home / Self Care | Payer: PRIVATE HEALTH INSURANCE | Attending: Emergency Medicine | Admitting: Emergency Medicine

## 2020-01-02 ENCOUNTER — Telehealth: Payer: Self-pay | Admitting: Emergency Medicine

## 2020-01-02 DIAGNOSIS — A084 Viral intestinal infection, unspecified: Secondary | ICD-10-CM | POA: Insufficient documentation

## 2020-01-02 DIAGNOSIS — Z79899 Other long term (current) drug therapy: Secondary | ICD-10-CM | POA: Insufficient documentation

## 2020-01-02 DIAGNOSIS — Z7982 Long term (current) use of aspirin: Secondary | ICD-10-CM | POA: Insufficient documentation

## 2020-01-02 DIAGNOSIS — I10 Essential (primary) hypertension: Secondary | ICD-10-CM | POA: Diagnosis not present

## 2020-01-02 DIAGNOSIS — U071 COVID-19: Secondary | ICD-10-CM | POA: Diagnosis not present

## 2020-01-02 DIAGNOSIS — R109 Unspecified abdominal pain: Secondary | ICD-10-CM | POA: Diagnosis present

## 2020-01-02 LAB — CBC WITH DIFFERENTIAL/PLATELET
Abs Immature Granulocytes: 0.03 10*3/uL (ref 0.00–0.07)
Basophils Absolute: 0 10*3/uL (ref 0.0–0.1)
Basophils Relative: 0 %
Eosinophils Absolute: 0 10*3/uL (ref 0.0–0.5)
Eosinophils Relative: 0 %
HCT: 39.3 % (ref 39.0–52.0)
Hemoglobin: 13.3 g/dL (ref 13.0–17.0)
Immature Granulocytes: 1 %
Lymphocytes Relative: 24 %
Lymphs Abs: 1.4 10*3/uL (ref 0.7–4.0)
MCH: 31.3 pg (ref 26.0–34.0)
MCHC: 33.8 g/dL (ref 30.0–36.0)
MCV: 92.5 fL (ref 80.0–100.0)
Monocytes Absolute: 0.7 10*3/uL (ref 0.1–1.0)
Monocytes Relative: 12 %
Neutro Abs: 3.7 10*3/uL (ref 1.7–7.7)
Neutrophils Relative %: 63 %
Platelets: 171 10*3/uL (ref 150–400)
RBC: 4.25 MIL/uL (ref 4.22–5.81)
RDW: 12.2 % (ref 11.5–15.5)
WBC: 5.9 10*3/uL (ref 4.0–10.5)
nRBC: 0 % (ref 0.0–0.2)

## 2020-01-02 LAB — COMPREHENSIVE METABOLIC PANEL
ALT: 89 U/L — ABNORMAL HIGH (ref 0–44)
AST: 51 U/L — ABNORMAL HIGH (ref 15–41)
Albumin: 4.6 g/dL (ref 3.5–5.0)
Alkaline Phosphatase: 61 U/L (ref 38–126)
Anion gap: 10 (ref 5–15)
BUN: 14 mg/dL (ref 6–20)
CO2: 25 mmol/L (ref 22–32)
Calcium: 9.1 mg/dL (ref 8.9–10.3)
Chloride: 97 mmol/L — ABNORMAL LOW (ref 98–111)
Creatinine, Ser: 1.11 mg/dL (ref 0.61–1.24)
GFR calc Af Amer: >60 mL/min (ref >60)
GFR calc non Af Amer: >60 mL/min (ref >60)
Glucose, Bld: 109 mg/dL — ABNORMAL HIGH (ref 70–99)
Potassium: 3.9 mmol/L (ref 3.5–5.1)
Sodium: 132 mmol/L — ABNORMAL LOW (ref 135–145)
Total Bilirubin: 0.9 mg/dL (ref 0.3–1.2)
Total Protein: 8.2 g/dL — ABNORMAL HIGH (ref 6.5–8.1)

## 2020-01-02 LAB — BRAIN NATRIURETIC PEPTIDE: B Natriuretic Peptide: 30 pg/mL (ref 0.0–100.0)

## 2020-01-02 LAB — PROCALCITONIN: Procalcitonin: <0.1 ng/mL

## 2020-01-02 LAB — TROPONIN I (HIGH SENSITIVITY): Troponin I (High Sensitivity): 5 ng/L (ref <18)

## 2020-01-02 LAB — LACTIC ACID, PLASMA: Lactic Acid, Venous: 1 mmol/L (ref 0.5–1.9)

## 2020-01-02 LAB — SARS CORONAVIRUS 2 BY RT PCR (HOSPITAL ORDER, PERFORMED IN ~~LOC~~ HOSPITAL LAB): SARS Coronavirus 2: POSITIVE — AB

## 2020-01-02 MED ORDER — IOHEXOL 300 MG/ML  SOLN
100.0000 mL | Freq: Once | INTRAMUSCULAR | Status: AC | PRN
  Administered 2020-01-02: 100 mL via INTRAVENOUS

## 2020-01-02 MED ORDER — AZITHROMYCIN 250 MG PO TABS
ORAL_TABLET | ORAL | 0 refills | Status: DC
Start: 2020-01-02 — End: 2020-05-23

## 2020-01-02 MED ORDER — TRAMADOL HCL 50 MG PO TABS: 50.0000 mg | ORAL_TABLET | Freq: Four times a day (QID) | ORAL | 0 refills | Status: AC | PRN

## 2020-01-02 MED ORDER — ONDANSETRON 4 MG PO TBDP
4.0000 mg | ORAL_TABLET | Freq: Three times a day (TID) | ORAL | 0 refills | Status: DC | PRN
Start: 2020-01-02 — End: 2020-05-23

## 2020-01-02 MED ORDER — SODIUM CHLORIDE 0.9 % IV BOLUS
1000.0000 mL | Freq: Once | INTRAVENOUS | Status: AC
  Administered 2020-01-02: 1000 mL via INTRAVENOUS

## 2020-01-02 MED ORDER — ONDANSETRON HCL 4 MG/2ML IJ SOLN
4.0000 mg | Freq: Once | INTRAMUSCULAR | Status: AC
  Administered 2020-01-02: 4 mg via INTRAVENOUS
  Filled 2020-01-02: qty 2

## 2020-01-02 MED ORDER — PREDNISONE 20 MG PO TABS: 40.0000 mg | ORAL_TABLET | Freq: Every day | ORAL | 0 refills | Status: AC

## 2020-01-02 MED ORDER — IVERMECTIN 3 MG PO TABS: 200.0000 ug/kg | ORAL_TABLET | Freq: Every day | ORAL | 0 refills | Status: AC

## 2020-01-02 MED ORDER — NAPROXEN 500 MG PO TABS
500.0000 mg | ORAL_TABLET | Freq: Two times a day (BID) | ORAL | 0 refills | Status: DC
Start: 2020-01-02 — End: 2020-05-23

## 2020-01-02 MED ORDER — MORPHINE SULFATE (PF) 4 MG/ML IV SOLN
4.0000 mg | Freq: Once | INTRAVENOUS | Status: AC
  Administered 2020-01-02: 4 mg via INTRAVENOUS
  Filled 2020-01-02: qty 1

## 2020-01-02 MED ORDER — FAMOTIDINE 20 MG PO TABS
20.0000 mg | ORAL_TABLET | Freq: Two times a day (BID) | ORAL | 0 refills | Status: DC
Start: 2020-01-02 — End: 2020-05-23

## 2020-01-02 MED ORDER — PANTOPRAZOLE SODIUM 40 MG IV SOLR
40.0000 mg | Freq: Once | INTRAVENOUS | Status: AC
  Administered 2020-01-02: 40 mg via INTRAVENOUS
  Filled 2020-01-02: qty 40

## 2020-01-02 MED ORDER — LOPERAMIDE HCL 2 MG PO TABS
4.0000 mg | ORAL_TABLET | Freq: Four times a day (QID) | ORAL | 0 refills | Status: DC | PRN
Start: 2020-01-02 — End: 2020-05-23

## 2020-01-02 NOTE — ED Provider Notes (Signed)
[facility] Emergency Department Provider Note  ____________________________________________  Time seen: Approximately 1:04 PM  I have reviewed the triage vital signs and the nursing notes.   HISTORY  Chief Complaint Abdominal Pain, Flank Pain, Cough, and Shortness of Breath    HPI Devin Stanton is a 59 y.o. male with a past history of kidney stones, hyperlipidemia, hypertension who comes the ED complaining of multiple symptoms.  He had multiple family members sick in the household over the last 2 months with a GI illness consisting of abdominal pain, vomiting, diarrhea.  Then about 10 days ago the patient started having chest pain, shortness of breath, nonproductive cough which has continued.  He feels more short of breath with walking at home.  Also within the last few days he is having worsening left-sided abdominal pain which is crampy and radiating to bilateral lower back, associated nausea, decreased appetite and oral intake, and diarrhea.  No aggravating or alleviating factors.  Patient reports that the respiratory symptoms were not that worrisome and the onset of abdominal symptoms is what prompted today's ED visit.  Past Medical History:  Diagnosis Date  . Backache, unspecified   . ED (erectile dysfunction)   . History of kidney stones 1995   "passed them" (04/08/2016)  . Hyperlipidemia    controlled medically  . Unspecified essential hypertension    controlled  . Unspecified personal history presenting hazards to health      Patient Active Problem List   Diagnosis Date Noted  . Prediabetes 09/13/2014  . Family history of premature CAD 05/31/2014  . Routine health maintenance 04/09/2011  . Hyperlipidemia 04/18/2009  . ERECTILE DYSFUNCTION, MILD 10/14/2007  . Essential hypertension, benign 10/14/2007     Past Surgical History:  Procedure Laterality Date  . ANTERIOR CERVICAL DECOMP/DISCECTOMY FUSION  06/2015   "C3-4-5"  . BACK SURGERY     . CARDIAC CATHETERIZATION N/A 04/09/2016   Procedure: Left Heart Cath and Coronary Angiography;  Surgeon: Leonie Man, MD;  Location: [facility];  Service: Cardiovascular;  Laterality: N/A;  . CYST EXCISION Left ~ 2013   "mucoid cyst; middle finger"  . PERIPHERAL VASCULAR CATHETERIZATION N/A 04/09/2016   Procedure: Abdominal Aortogram;  Surgeon: Leonie Man, MD;  Location: [facility];  Service: Cardiovascular;  Laterality: N/A;  . TONSILLECTOMY    . VASECTOMY  ~ 2012     Prior to Admission medications   Medication Sig Start Date End Date Taking? Authorizing Provider  albuterol (PROVENTIL HFA;VENTOLIN HFA) 108 (90 Base) MCG/ACT inhaler Inhale 2 puffs into the lungs every 6 (six) hours as needed for wheezing or shortness of breath. 07/30/18   Bedsole, Amy E, MD  amLODipine (NORVASC) 10 MG tablet Take 1 tablet (10 mg total) by mouth daily. 04/23/19   Bedsole, Amy E, MD  aspirin 81 MG tablet Take 81 mg by mouth daily.    [provider]  benazepril (LOTENSIN) 40 MG tablet Take 1 tablet (40 mg total) by mouth daily. 04/23/19   Bedsole, Amy E, MD  famotidine (PEPCID) 20 MG tablet Take 1 tablet (20 mg total) by mouth 2 (two) times daily. 01/02/20   Carrie Mew, MD  fluticasone (CUTIVATE) 0.005 % ointment Apply 1 application topically 2 (two) times daily. 09/13/14   Bedsole, Amy E, MD  loperamide (IMODIUM A-D) 2 MG tablet Take 2 tablets (4 mg total) by mouth 4 (four) times daily as needed for diarrhea or loose stools. 01/02/20   Carrie Mew,  MD  metoprolol succinate (TOPROL-XL) 50 MG 24 hr tablet Take 1 tablet (50 mg total) by mouth daily. Take with or immediately following a meal. 04/23/19   Bedsole, Amy E, MD  naproxen (NAPROSYN) 500 MG tablet Take 1 tablet (500 mg total) by mouth 2 (two) times daily with a meal. 01/02/20   Carrie Mew, MD  ondansetron (ZOFRAN ODT) 4 MG disintegrating tablet Take 1 tablet (4 mg total) by mouth every 8 (eight) hours as  needed for nausea or vomiting. 01/02/20   Carrie Mew, MD  predniSONE (DELTASONE) 20 MG tablet Take 2 tablets (40 mg total) by mouth daily for 5 days. 01/02/20 01/07/20  Carrie Mew, MD  rosuvastatin (CRESTOR) 10 MG tablet Take 1 tablet (10 mg total) by mouth daily. 04/23/19   Bedsole, Amy E, MD  traMADol (ULTRAM) 50 MG tablet Take 1 tablet (50 mg total) by mouth every 6 (six) hours as needed for up to 12 days for severe pain. 01/02/20 01/14/20  Carrie Mew, MD     Allergies Patient has no known allergies.   Family History  Problem Relation Age of Onset  . Parkinsonism Mother   . Macular degeneration Mother   . Leukemia Father   . Coronary artery disease Father   . Heart disease Brother 39       CABG x 5  . Heart disease Brother   . Stroke Brother 37  . Colon cancer Neg Hx   . Esophageal cancer Neg Hx   . Stomach cancer Neg Hx     Social History Social History   Tobacco Use  . Smoking status: Never Smoker  . Smokeless tobacco: Current User    Types: Snuff  Substance Use Topics  . Alcohol use: Yes    Alcohol/week: 8.0 standard drinks    Types: 4 Standard drinks or equivalent, 4 Shots of liquor per week  . Drug use: No    Review of Systems  Constitutional:   No fever positive chills.  ENT:   No sore throat. No rhinorrhea. Cardiovascular:   Positive chest discomfort without syncope. Respiratory: Positive shortness of breath and nonproductive cough. Gastrointestinal:   Positive as above for abdominal pain and diarrhea.  Musculoskeletal:   Negative for focal pain or swelling All other systems reviewed and are negative except as documented above in ROS and HPI.  ____________________________________________   PHYSICAL EXAM:  VITAL SIGNS: ED Triage Vitals  Enc Vitals Group     BP 01/02/20 1114 125/74     Pulse Rate 01/02/20 1114 80     Resp 01/02/20 1114 18     Temp 01/02/20 1114 99.9 F (37.7 C)     Temp Source 01/02/20 1114 Oral     SpO2 01/02/20  1114 97 %     Weight 01/02/20 1117 170 lb (77.1 kg)     Height 01/02/20 1117 5\' 7"  (1.702 m)     Head Circumference --      Peak Flow --      Pain Score 01/02/20 1116 6     Pain Loc --      Pain Edu? --      Excl. in [place]? --     Vital signs reviewed, nursing assessments reviewed.   Constitutional:   Alert and oriented. Non-toxic appearance. Eyes:   Conjunctivae are normal. EOMI. PERRL. ENT      Head:   Normocephalic and atraumatic.      Nose:   Wearing a mask.  Mouth/Throat:   Wearing a mask.      Neck:   No meningismus. Full ROM. Hematological/Lymphatic/Immunilogical:   No cervical lymphadenopathy. Cardiovascular:   RRR. Symmetric bilateral radial and DP pulses.  No murmurs. Cap refill less than 2 seconds. Respiratory:   Normal respiratory effort without tachypnea/retractions. Breath sounds are clear and equal bilaterally. No wheezes/rales/rhonchi. Gastrointestinal:   Soft with left lower quadrant tenderness. Non distended. There is no CVA tenderness.  No rebound, rigidity, or guarding. Musculoskeletal:   Normal range of motion in all extremities. No joint effusions.  No lower extremity tenderness.  No edema. Neurologic:   Normal speech and language.  Motor grossly intact. No acute focal neurologic deficits are appreciated.  Skin:    Skin is warm, dry and intact. No rash noted.  No petechiae, purpura, or bullae.  Ambulatory without severe dyspnea, pulse ox 98% after walking back and forth in the room a few times ____________________________________________    LABS (pertinent positives/negatives) (all labs ordered are listed, but only abnormal results are displayed) Labs Reviewed  SARS CORONAVIRUS 2 BY RT PCR (HOSPITAL ORDER, [place] [facility]) - Abnormal; Notable for the following components:      Result Value   SARS Coronavirus 2 POSITIVE (*)    All other components within normal limits  COMPREHENSIVE METABOLIC PANEL - Abnormal; Notable for the  following components:   Sodium 132 (*)    Chloride 97 (*)    Glucose, Bld 109 (*)    Total Protein 8.2 (*)    AST 51 (*)    ALT 89 (*)    All other components within normal limits  CBC WITH DIFFERENTIAL/PLATELET  BRAIN NATRIURETIC PEPTIDE  LACTIC ACID, PLASMA  PROCALCITONIN  TROPONIN I (HIGH SENSITIVITY)   ____________________________________________   EKG    ____________________________________________    RADIOLOGY  DG Chest 2 View  Result Date: 01/02/2020 CLINICAL DATA:  Of breath, worsening cough, abdominal pain for 1 week specially worsened in last few days, history hypertension, hyperlipidemia EXAM: CHEST - 2 VIEW COMPARISON:  07/20/2018 FINDINGS: Normal heart size, mediastinal contours, and pulmonary vascularity. Hazy infiltrate mid LEFT lung question pneumonia. Subsegmental atelectasis at both lung bases. Remaining lungs clear. No pleural effusion or pneumothorax. Prior cervical spine fusion. IMPRESSION: Bibasilar atelectasis with hazy infiltrate in mid LEFT lung question pneumonia. Electronically Signed   By: Lavonia Dana M.D.   On: 01/02/2020 10:59   CT ABDOMEN PELVIS W CONTRAST  Result Date: 01/02/2020 CLINICAL DATA:  Diverticulitis suspected EXAM: CT ABDOMEN AND PELVIS WITH CONTRAST TECHNIQUE: Multidetector CT imaging of the abdomen and pelvis was performed using the standard protocol following bolus administration of intravenous contrast. CONTRAST:  11mL OMNIPAQUE IOHEXOL 300 MG/ML  SOLN COMPARISON:  01/02/2011 FINDINGS: Lower chest: There are subpleural ground-glass opacities in the included bilateral lung bases in addition to bandlike scarring (series 4, image 8, 4). Coronary artery calcifications Hepatobiliary: No solid liver abnormality is seen. Hepatic steatosis. No gallstones, gallbladder wall thickening, or biliary dilatation. Pancreas: Unremarkable. No pancreatic ductal dilatation or surrounding inflammatory changes. Spleen: Normal in size without significant  abnormality. Adrenals/Urinary Tract: Adrenal glands are unremarkable. Small nonobstructive bilateral renal calculi. No hydronephrosis. Bladder is unremarkable. Stomach/Bowel: Stomach is within normal limits. Appendix appears normal. No evidence of bowel wall thickening, distention, or inflammatory changes. Vascular/Lymphatic: Aortic atherosclerosis. No enlarged abdominal or pelvic lymph nodes. Reproductive: No mass or other significant abnormality. Other: No abdominal wall hernia or abnormality. No abdominopelvic ascites. Musculoskeletal: No acute or significant osseous  findings. IMPRESSION: 1. No acute CT findings of the abdomen or pelvis to explain pain. 2. There are subpleural ground-glass opacities in the included bilateral lung bases in addition to bandlike scarring. Differential considerations include multifocal infection, including COVID-19 if clinically suspected, and pulmonary infarcts. Consider dedicated imaging of the chest. 3. Hepatic steatosis. 4. Nonobstructive bilateral nephrolithiasis. 5. Coronary artery disease.  Aortic Atherosclerosis (ICD10-I70.0). Electronically Signed   By: Eddie Candle M.D.   On: 01/02/2020 13:04    ____________________________________________   PROCEDURES Procedures  ____________________________________________  DIFFERENTIAL DIAGNOSIS   Diverticulitis, viral gastroenteritis, COVID-19, bacterial pneumonia.  Doubt ACS PE dissection AAA mesenteric ischemia biliary disease pancreatitis small bowel obstruction or abdominal perforation.  CLINICAL IMPRESSION / ASSESSMENT AND PLAN / ED COURSE  Medications ordered in the ED: Medications  sodium chloride 0.9 % bolus 1,000 mL (1,000 mLs Intravenous New Bag/Given 01/02/20 1208)  ondansetron (ZOFRAN) injection 4 mg (4 mg Intravenous Given 01/02/20 1208)  morphine 4 MG/ML injection 4 mg (4 mg Intravenous Given 01/02/20 1208)  pantoprazole (PROTONIX) injection 40 mg (40 mg Intravenous Given 01/02/20 1208)  iohexol  (OMNIPAQUE) 300 MG/ML solution 100 mL (100 mLs Intravenous Contrast Given 01/02/20 1247)    Pertinent labs & imaging results that were available during my care of the patient were reviewed by me and considered in my medical decision making (see chart for details).  PRECILIANO CASTELL was evaluated in Emergency Department on 01/02/2020 for the symptoms described in the history of present illness. He was evaluated in the context of the global COVID-19 pandemic, which necessitated consideration that the patient might be at risk for infection with the SARS-CoV-2 virus that causes COVID-19. Institutional protocols and algorithms that pertain to the evaluation of patients at risk for COVID-19 are in a state of rapid change based on information released by regulatory bodies including the CDC and federal and state organizations. These policies and algorithms were followed during the patient's care in the ED.     Clinical Course as of Jan 02 1435  Sun Jan 02, 2020  1121 Patient presents with multiple symptoms including nonproductive cough, nausea and decreased appetite diarrhea, gradual onset and persistent over the past 10 days.  Left lower quadrant tenderness concerning for diverticulitis.  Other differential items include pneumonia, Covid, viral gastroenteritis.  Patient is not septic, nontoxic.   [PS]    Clinical Course User Index [PS] Carrie Mew, MD     ----------------------------------------- 2:36 PM on 01/02/2020 -----------------------------------------  Work-up reassuring, vital signs remain normal.  Covid test positive.  Did more ambulating in the room, lowest oxygen saturation was 94%.  Patient is eager to go home, and is stable to do so.  I will provide multiple prescriptions for supportive care.  With this duration of symptoms I think would be wise to start him on prednisone and azithromycin as well.  ____________________________________________   FINAL CLINICAL IMPRESSION(S) / ED  DIAGNOSES    Final diagnoses:  COVID-19 virus infection  Viral gastroenteritis     ED Discharge Orders         Ordered    predniSONE (DELTASONE) 20 MG tablet  Daily     Discontinue  Reprint     01/02/20 1435    ondansetron (ZOFRAN ODT) 4 MG disintegrating tablet  Every 8 hours PRN     Discontinue  Reprint     01/02/20 1435    naproxen (NAPROSYN) 500 MG tablet  2 times daily with meals     Discontinue  Reprint  01/02/20 1435    traMADol (ULTRAM) 50 MG tablet  Every 6 hours PRN     Discontinue  Reprint     01/02/20 1435    famotidine (PEPCID) 20 MG tablet  2 times daily     Discontinue  Reprint     01/02/20 1435    loperamide (IMODIUM A-D) 2 MG tablet  4 times daily PRN     Discontinue  Reprint     01/02/20 1435          Portions of this note were generated with dragon dictation software. Dictation errors may occur despite best attempts at proofreading.   Carrie Mew, MD 01/02/20 1436

## 2020-01-02 NOTE — ED Notes (Signed)
Date and time results received: 01/02/20 1339  Test: COVID Critical Value: Positive  Name of Provider Notified: Dr. Joni Fears, MD

## 2020-01-02 NOTE — ED Notes (Signed)
Pt assessed by MD prior to discharge.

## 2020-01-02 NOTE — Telephone Encounter (Signed)
Family request of a trial of ivermectin which I sent electronically

## 2020-01-02 NOTE — ED Triage Notes (Signed)
Pt has been having symptoms for the past 10 days, upper resp symptoms causing him to cough and feeling sob with movement, nausea, abd pain with bilat back pain, more tender on the left side lower quad

## 2020-01-02 NOTE — Discharge Instructions (Addendum)
Monitor your oxygen level at home and return to the ER if you have worsening shortness of breath or other severe symptoms.    Naproxen 500mg  twice a day can help with the pain.  Naproxen and prednisone together can cause bad stomach irritation, and taking famotidine can help protect your stomach to prevent worsened symptoms. Loperamide can be taken as needed for diarrhea.

## 2020-01-03 ENCOUNTER — Telehealth: Payer: Self-pay | Admitting: Physician Assistant

## 2020-01-03 NOTE — Telephone Encounter (Signed)
Called to discuss with patient about Covid symptoms and the use of bamlanivimab/etesevimab or casirivimab/imdevimab, a monoclonal antibody infusion for those with mild to moderate Covid symptoms and at a high risk of hospitalization.  Pt is qualified for this infusion at the [facility] due to Hypertension   Message left to call back our hotline [phone].  Angelena Form PA-C  [facility]

## 2020-05-07 ENCOUNTER — Telehealth: Payer: Self-pay | Admitting: Family Medicine

## 2020-05-08 NOTE — Telephone Encounter (Signed)
Please schedule CPE with fasting labs prior with Dr. Bedsole.  

## 2020-05-08 NOTE — Telephone Encounter (Signed)
Spoke with patient schedule cpe and labs

## 2020-05-11 ENCOUNTER — Telehealth: Payer: Self-pay | Admitting: Family Medicine

## 2020-05-11 DIAGNOSIS — R7303 Prediabetes: Secondary | ICD-10-CM

## 2020-05-11 DIAGNOSIS — Z125 Encounter for screening for malignant neoplasm of prostate: Secondary | ICD-10-CM

## 2020-05-11 DIAGNOSIS — E785 Hyperlipidemia, unspecified: Secondary | ICD-10-CM

## 2020-05-11 NOTE — Telephone Encounter (Signed)
-----   Message from Cloyd Stagers, RT sent at 05/08/2020  5:03 PM EST ----- Regarding: Lab Orders for Friday 12.3.2021 Please place lab orders for Friday 12.3.2021, office visit for physical on Thursday 12.9.2021 Thank you, Dyke Maes RT(R)

## 2020-05-12 ENCOUNTER — Other Ambulatory Visit (INDEPENDENT_AMBULATORY_CARE_PROVIDER_SITE_OTHER): Payer: PRIVATE HEALTH INSURANCE

## 2020-05-12 ENCOUNTER — Other Ambulatory Visit: Payer: Self-pay

## 2020-05-12 DIAGNOSIS — Z125 Encounter for screening for malignant neoplasm of prostate: Secondary | ICD-10-CM | POA: Diagnosis not present

## 2020-05-12 DIAGNOSIS — E785 Hyperlipidemia, unspecified: Secondary | ICD-10-CM

## 2020-05-12 DIAGNOSIS — R7303 Prediabetes: Secondary | ICD-10-CM | POA: Diagnosis not present

## 2020-05-12 LAB — COMPREHENSIVE METABOLIC PANEL
ALT: 68 U/L — ABNORMAL HIGH (ref 0–53)
AST: 34 U/L (ref 0–37)
Albumin: 4.7 g/dL (ref 3.5–5.2)
Alkaline Phosphatase: 62 U/L (ref 39–117)
BUN: 33 mg/dL — ABNORMAL HIGH (ref 6–23)
CO2: 26 mEq/L (ref 19–32)
Calcium: 9.7 mg/dL (ref 8.4–10.5)
Chloride: 101 mEq/L (ref 96–112)
Creatinine, Ser: 1.29 mg/dL (ref 0.40–1.50)
GFR: 60.49 mL/min (ref >60.00)
Glucose, Bld: 102 mg/dL — ABNORMAL HIGH (ref 70–99)
Potassium: 4.5 mEq/L (ref 3.5–5.1)
Sodium: 137 mEq/L (ref 135–145)
Total Bilirubin: 0.6 mg/dL (ref 0.2–1.2)
Total Protein: 7.4 g/dL (ref 6.0–8.3)

## 2020-05-12 LAB — LIPID PANEL
Cholesterol: 181 mg/dL (ref 0–200)
HDL: 72.5 mg/dL (ref >39.00)
LDL Cholesterol: 87 mg/dL (ref 0–99)
NonHDL: 108.11
Total CHOL/HDL Ratio: 2
Triglycerides: 106 mg/dL (ref 0.0–149.0)
VLDL: 21.2 mg/dL (ref 0.0–40.0)

## 2020-05-12 LAB — HEMOGLOBIN A1C: Hgb A1c MFr Bld: 5.7 % (ref 4.6–6.5)

## 2020-05-12 LAB — PSA: PSA: 0.9 ng/mL (ref 0.10–4.00)

## 2020-05-12 NOTE — Progress Notes (Signed)
No critical labs need to be addressed urgently. We will discuss labs in detail at upcoming office visit.   

## 2020-05-18 ENCOUNTER — Encounter: Payer: PRIVATE HEALTH INSURANCE | Admitting: Family Medicine

## 2020-05-23 ENCOUNTER — Ambulatory Visit: Payer: PRIVATE HEALTH INSURANCE | Admitting: Family Medicine

## 2020-05-23 ENCOUNTER — Other Ambulatory Visit: Payer: Self-pay

## 2020-05-23 ENCOUNTER — Encounter: Payer: Self-pay | Admitting: Family Medicine

## 2020-05-23 VITALS — BP 118/64 | HR 81 | Temp 98.0°F | Resp 15 | Ht 67.0 in | Wt 178.4 lb

## 2020-05-23 DIAGNOSIS — Z0000 Encounter for general adult medical examination without abnormal findings: Secondary | ICD-10-CM

## 2020-05-23 DIAGNOSIS — E785 Hyperlipidemia, unspecified: Secondary | ICD-10-CM

## 2020-05-23 DIAGNOSIS — I10 Essential (primary) hypertension: Secondary | ICD-10-CM

## 2020-05-23 DIAGNOSIS — Z23 Encounter for immunization: Secondary | ICD-10-CM | POA: Diagnosis not present

## 2020-05-23 DIAGNOSIS — R7303 Prediabetes: Secondary | ICD-10-CM | POA: Diagnosis not present

## 2020-05-23 MED ORDER — ROSUVASTATIN CALCIUM 10 MG PO TABS
10.0000 mg | ORAL_TABLET | Freq: Every day | ORAL | 3 refills | Status: DC
Start: 2020-05-23 — End: 2021-07-30

## 2020-05-23 MED ORDER — AMLODIPINE BESYLATE 10 MG PO TABS
10.0000 mg | ORAL_TABLET | Freq: Every day | ORAL | 3 refills | Status: DC
Start: 2020-05-23 — End: 2021-07-30

## 2020-05-23 MED ORDER — METOPROLOL SUCCINATE ER 50 MG PO TB24
50.0000 mg | ORAL_TABLET | Freq: Every day | ORAL | 3 refills | Status: DC
Start: 2020-05-23 — End: 2021-07-30

## 2020-05-23 MED ORDER — BENAZEPRIL HCL 40 MG PO TABS
40.0000 mg | ORAL_TABLET | Freq: Every day | ORAL | 3 refills | Status: DC
Start: 2020-05-23 — End: 2021-07-30

## 2020-05-23 NOTE — Patient Instructions (Signed)
Preventive Care 41-60 Years Old, Male Preventive care refers to lifestyle choices and visits with your health care provider that can promote health and wellness. This includes:  A yearly physical exam. This is also called an annual well check.  Regular dental and eye exams.  Immunizations.  Screening for certain conditions.  Healthy lifestyle choices, such as eating a healthy diet, getting regular exercise, not using drugs or products that contain nicotine and tobacco, and limiting alcohol use. What can I expect for my preventive care visit? Physical exam Your health care provider will check:  Height and weight. These may be used to calculate body mass index (BMI), which is a measurement that tells if you are at a healthy weight.  Heart rate and blood pressure.  Your skin for abnormal spots. Counseling Your health care provider may ask you questions about:  Alcohol, tobacco, and drug use.  Emotional well-being.  Home and relationship well-being.  Sexual activity.  Eating habits.  Work and work Statistician. What immunizations do I need?  Influenza (flu) vaccine  This is recommended every year. Tetanus, diphtheria, and pertussis (Tdap) vaccine  You may need a Td booster every 10 years. Varicella (chickenpox) vaccine  You may need this vaccine if you have not already been vaccinated. Zoster (shingles) vaccine  You may need this after age 64. Measles, mumps, and rubella (MMR) vaccine  You may need at least one dose of MMR if you were born in 1957 or later. You may also need a second dose. Pneumococcal conjugate (PCV13) vaccine  You may need this if you have certain conditions and were not previously vaccinated. Pneumococcal polysaccharide (PPSV23) vaccine  You may need one or two doses if you smoke cigarettes or if you have certain conditions. Meningococcal conjugate (MenACWY) vaccine  You may need this if you have certain conditions. Hepatitis A  vaccine  You may need this if you have certain conditions or if you travel or work in places where you may be exposed to hepatitis A. Hepatitis B vaccine  You may need this if you have certain conditions or if you travel or work in places where you may be exposed to hepatitis B. Haemophilus influenzae type b (Hib) vaccine  You may need this if you have certain risk factors. Human papillomavirus (HPV) vaccine  If recommended by your health care provider, you may need three doses over 6 months. You may receive vaccines as individual doses or as more than one vaccine together in one shot (combination vaccines). Talk with your health care provider about the risks and benefits of combination vaccines. What tests do I need? Blood tests  Lipid and cholesterol levels. These may be checked every 5 years, or more frequently if you are over 60 years old.  Hepatitis C test.  Hepatitis B test. Screening  Lung cancer screening. You may have this screening every year starting at age 43 if you have a 30-pack-year history of smoking and currently smoke or have quit within the past 15 years.  Prostate cancer screening. Recommendations will vary depending on your family history and other risks.  Colorectal cancer screening. All adults should have this screening starting at age 72 and continuing until age 2. Your health care provider may recommend screening at age 14 if you are at increased risk. You will have tests every 1-10 years, depending on your results and the type of screening test.  Diabetes screening. This is done by checking your blood sugar (glucose) after you have not eaten  for a while (fasting). You may have this done every 1-3 years.  Sexually transmitted disease (STD) testing. Follow these instructions at home: Eating and drinking  Eat a diet that includes fresh fruits and vegetables, whole grains, lean protein, and low-fat dairy products.  Take vitamin and mineral supplements as  recommended by your health care provider.  Do not drink alcohol if your health care provider tells you not to drink.  If you drink alcohol: ? Limit how much you have to 0-2 drinks a day. ? Be aware of how much alcohol is in your drink. In the [place] one drink equals one 12 oz bottle of beer (355 mL), one 5 oz glass of wine (148 mL), or one 1 oz glass of hard liquor (44 mL). Lifestyle  Take daily care of your teeth and gums.  Stay active. Exercise for at least 30 minutes on 5 or more days each week.  Do not use any products that contain nicotine or tobacco, such as cigarettes, e-cigarettes, and chewing tobacco. If you need help quitting, ask your health care provider.  If you are sexually active, practice safe sex. Use a condom or other form of protection to prevent STIs (sexually transmitted infections).  Talk with your health care provider about taking a low-dose aspirin every day starting at age 53. What's next?  Go to your health care provider once a year for a well check visit.  Ask your health care provider how often you should have your eyes and teeth checked.  Stay up to date on all vaccines. This information is not intended to replace advice given to you by your health care provider. Make sure you discuss any questions you have with your health care provider. Document Revised: 05/21/2018 Document Reviewed: 05/21/2018 Elsevier Patient Education  2020 [employer].

## 2020-05-23 NOTE — Progress Notes (Signed)
Patient ID: Devin Stanton, male    DOB: 10-Nov-1960, 59 y.o.   MRN: [id number]  This visit was conducted in person.  BP 118/64 (BP Location: Left Arm, Patient Position: Sitting, Cuff Size: Normal)   Pulse 81   Temp 98 F (36.7 C) (Oral)   Resp 15   Ht 5\' 7"  (1.702 m)   Wt 178 lb 6.4 oz (80.9 kg)   SpO2 97%   BMI 27.94 kg/m    CC: annual exam Subjective:   HPI: Devin Stanton is a 59 y.o. male presenting on 05/23/2020 for annual physical.  Hypertension:   Good control on amlodipine, metoprolol and benazapril Using medication without problems or lightheadedness:  none Chest pain with exertion:none Edema:none Short of breath:none Average home BPs: 120/70 Other issues:   Current dip user... frequently during the day.    Elevated Cholesterol:  At goal on crestor Lab Results  Component Value Date   CHOL 181 05/12/2020   HDL 72.50 05/12/2020   LDLCALC 87 05/12/2020   LDLDIRECT 167.2 04/14/2009   TRIG 106.0 05/12/2020   CHOLHDL 2 05/12/2020    Using medications without problems:none Muscle aches: none Diet compliance: Good Exercise: 3 days a week Other complaints:  Relevant past medical, surgical, family and social history reviewed and updated as indicated. Interim medical history since our last visit reviewed. Allergies and medications reviewed and updated. Outpatient Medications Prior to Visit  Medication Sig Dispense Refill  . amLODipine (NORVASC) 10 MG tablet TAKE 1 TABLET BY MOUTH EVERY DAY 90 tablet 0  . aspirin 81 MG tablet Take 81 mg by mouth daily.    . benazepril (LOTENSIN) 40 MG tablet TAKE 1 TABLET BY MOUTH EVERY DAY 90 tablet 0  . fluticasone (CUTIVATE) 0.005 % ointment Apply 1 application topically 2 (two) times daily. 30 g 0  . metoprolol succinate (TOPROL-XL) 50 MG 24 hr tablet TAKE 1 TABLET (50 MG TOTAL) BY MOUTH DAILY. TAKE WITH OR IMMEDIATELY FOLLOWING A MEAL. 90 tablet 0  . rosuvastatin (CRESTOR) 10 MG tablet TAKE 1 TABLET BY MOUTH EVERY DAY  90 tablet 0  . albuterol (PROVENTIL HFA;VENTOLIN HFA) 108 (90 Base) MCG/ACT inhaler Inhale 2 puffs into the lungs every 6 (six) hours as needed for wheezing or shortness of breath. 1 Inhaler 0  . azithromycin (ZITHROMAX Z-PAK) 250 MG tablet Take 2 tablets (500 mg) on  Day 1,  followed by 1 tablet (250 mg) once daily on Days 2 through 5. 6 each 0  . famotidine (PEPCID) 20 MG tablet Take 1 tablet (20 mg total) by mouth 2 (two) times daily. 60 tablet 0  . loperamide (IMODIUM A-D) 2 MG tablet Take 2 tablets (4 mg total) by mouth 4 (four) times daily as needed for diarrhea or loose stools. 30 tablet 0  . naproxen (NAPROSYN) 500 MG tablet Take 1 tablet (500 mg total) by mouth 2 (two) times daily with a meal. 20 tablet 0  . ondansetron (ZOFRAN ODT) 4 MG disintegrating tablet Take 1 tablet (4 mg total) by mouth every 8 (eight) hours as needed for nausea or vomiting. 20 tablet 0   No facility-administered medications prior to visit.     Per HPI unless specifically indicated in ROS section below Review of Systems  Constitutional: Negative for fatigue, fever and unexpected weight change.  HENT: Negative for congestion, ear pain, postnasal drip, rhinorrhea, sore throat and trouble swallowing.   Eyes: Negative for pain.  Respiratory: Negative for cough, shortness of breath and  wheezing.   Cardiovascular: Negative for chest pain, palpitations and leg swelling.  Gastrointestinal: Negative for abdominal pain, blood in stool, constipation, diarrhea and nausea.  Genitourinary: Negative for difficulty urinating, dysuria, hematuria, penile discharge, penile pain, penile swelling, scrotal swelling, testicular pain and urgency.  Skin: Negative for rash.  Neurological: Negative for syncope, weakness, light-headedness, numbness and headaches.  Psychiatric/Behavioral: Negative for behavioral problems and dysphoric mood. The patient is not nervous/anxious.    Objective:  BP 118/64 (BP Location: Left Arm, Patient  Position: Sitting, Cuff Size: Normal)   Pulse 81   Temp 98 F (36.7 C) (Oral)   Resp 15   Ht 5\' 7"  (1.702 m)   Wt 178 lb 6.4 oz (80.9 kg)   SpO2 97%   BMI 27.94 kg/m   Wt Readings from Last 3 Encounters:  05/23/20 178 lb 6.4 oz (80.9 kg)  01/02/20 170 lb (77.1 kg)  04/23/19 172 lb (78 kg)      Physical Exam Constitutional:      General: Vital signs are normal.     Appearance: He is well-developed and well-nourished.  HENT:     Head: Normocephalic.     Right Ear: Hearing normal.     Left Ear: Hearing normal.     Nose: Nose normal.     Mouth/Throat:     Mouth: Oropharynx is clear and moist and mucous membranes are normal.  Neck:     Thyroid: No thyroid mass or thyromegaly.     Vascular: No carotid bruit.     Trachea: Trachea normal.  Cardiovascular:     Rate and Rhythm: Normal rate and regular rhythm.     Pulses: Normal pulses.     Heart sounds: Heart sounds not distant. No murmur heard. No friction rub. No gallop.      Comments: No peripheral edema Pulmonary:     Effort: Pulmonary effort is normal. No respiratory distress.     Breath sounds: Normal breath sounds.  Skin:    General: Skin is warm, dry and intact.     Findings: No rash.  Psychiatric:        Mood and Affect: Mood and affect normal.        Speech: Speech normal.        Behavior: Behavior normal.        Thought Content: Thought content normal.       Results for orders placed or performed in visit on 05/12/20  PSA  Result Value Ref Range   PSA 0.90 0.10 - 4.00 ng/mL  Comprehensive metabolic panel  Result Value Ref Range   Sodium 137 135 - 145 mEq/L   Potassium 4.5 3.5 - 5.1 mEq/L   Chloride 101 96 - 112 mEq/L   CO2 26 19 - 32 mEq/L   Glucose, Bld 102 (H) 70 - 99 mg/dL   BUN 33 (H) 6 - 23 mg/dL   Creatinine, Ser 1.29 0.40 - 1.50 mg/dL   Total Bilirubin 0.6 0.2 - 1.2 mg/dL   Alkaline Phosphatase 62 39 - 117 U/L   AST 34 0 - 37 U/L   ALT 68 (H) 0 - 53 U/L   Total Protein 7.4 6.0 - 8.3 g/dL    Albumin 4.7 3.5 - 5.2 g/dL   GFR 60.49 >60.00 mL/min   Calcium 9.7 8.4 - 10.5 mg/dL  Lipid panel  Result Value Ref Range   Cholesterol 181 0 - 200 mg/dL   Triglycerides 106.0 0.0 - 149.0 mg/dL   HDL 72.50 >39.00  mg/dL   VLDL 21.2 0.0 - 40.0 mg/dL   LDL Cholesterol 87 0 - 99 mg/dL   Total CHOL/HDL Ratio 2    NonHDL 108.11   Hemoglobin A1c  Result Value Ref Range   Hgb A1c MFr Bld 5.7 4.6 - 6.5 %    This visit occurred during the SARS-CoV-2 public health emergency.  Safety protocols were in place, including screening questions prior to the visit, additional usage of staff PPE, and extensive cleaning of exam room while observing appropriate contact time as indicated for disinfecting solutions.   COVID 19 screen:  No recent travel or known exposure to COVID19 The patient denies respiratory symptoms of COVID 19 at this time. The importance of social distancing was discussed today.   Assessment and Plan   The patient's preventative maintenance and recommended screening tests for an annual wellness exam were reviewed in full today. Brought up to date unless services declined.  Counselled on the importance of diet, exercise, and its role in overall health and mortality. The patient's FH and SH was reviewed, including their home life, tobacco status, and drug and alcohol status.   Problem List Items Addressed This Visit    Essential hypertension, benign (Chronic)    Stable, chronic.  Continue current medication.  Amlodipine 10 mg dialy  benazapril 40 mg daily  toprol Xl 50 mg daily       Relevant Medications   amLODipine (NORVASC) 10 MG tablet   benazepril (LOTENSIN) 40 MG tablet   metoprolol succinate (TOPROL-XL) 50 MG 24 hr tablet   rosuvastatin (CRESTOR) 10 MG tablet   Hyperlipidemia (Chronic)    Stable, chronic.  Continue current medication.   Crestor 10 mg daily      Relevant Medications   amLODipine (NORVASC) 10 MG tablet   benazepril (LOTENSIN) 40 MG tablet    metoprolol succinate (TOPROL-XL) 50 MG 24 hr tablet   rosuvastatin (CRESTOR) 10 MG tablet   Prediabetes    Lab Results  Component Value Date   HGBA1C 5.7 05/12/2020   Stable control with diet.       Other Visit Diagnoses    Routine general medical examination at a health care facility    -  Primary   Need for immunization against influenza       Relevant Orders   Flu Vaccine QUAD 36+ mos IM (Completed)   Need for shingles vaccine       Relevant Orders   Varicella-zoster vaccine IM (Shingrix) (Completed)   Essential hypertension       Relevant Medications   amLODipine (NORVASC) 10 MG tablet   benazepril (LOTENSIN) 40 MG tablet   metoprolol succinate (TOPROL-XL) 50 MG 24 hr tablet   rosuvastatin (CRESTOR) 10 MG tablet     Orders Placed This Encounter  Procedures  . Flu Vaccine QUAD 36+ mos IM  . Varicella-zoster vaccine IM (Shingrix)   Meds ordered this encounter  Medications  . amLODipine (NORVASC) 10 MG tablet    Sig: Take 1 tablet (10 mg total) by mouth daily.    Dispense:  90 tablet    Refill:  3  . benazepril (LOTENSIN) 40 MG tablet    Sig: Take 1 tablet (40 mg total) by mouth daily.    Dispense:  90 tablet    Refill:  3  . metoprolol succinate (TOPROL-XL) 50 MG 24 hr tablet    Sig: Take 1 tablet (50 mg total) by mouth daily. Take with or immediately following a meal.  Dispense:  90 tablet    Refill:  3  . rosuvastatin (CRESTOR) 10 MG tablet    Sig: Take 1 tablet (10 mg total) by mouth daily.    Dispense:  90 tablet    Refill:  3    NEEDS OFFICE VISIT    Eliezer Lofts, MD

## 2020-06-06 DIAGNOSIS — M542 Cervicalgia: Secondary | ICD-10-CM | POA: Insufficient documentation

## 2020-06-12 NOTE — Assessment & Plan Note (Signed)
Lab Results  Component Value Date   HGBA1C 5.7 05/12/2020   Stable control with diet.

## 2020-06-12 NOTE — Assessment & Plan Note (Signed)
Stable, chronic.  Continue current medication.  Amlodipine 10 mg dialy  benazapril 40 mg daily  toprol Xl 50 mg daily

## 2020-06-12 NOTE — Assessment & Plan Note (Signed)
Stable, chronic.  Continue current medication.   Crestor 10 mg daily 

## 2020-06-15 ENCOUNTER — Other Ambulatory Visit: Payer: Self-pay | Admitting: Internal Medicine

## 2020-06-15 ENCOUNTER — Telehealth: Payer: Self-pay

## 2020-06-15 MED ORDER — TAMSULOSIN HCL 0.4 MG PO CAPS: 0.4000 mg | ORAL_CAPSULE | Freq: Every day | ORAL | 0 refills | Status: DC

## 2020-06-15 MED ORDER — HYDROCODONE-ACETAMINOPHEN 5-325 MG PO TABS: 1.0000 | ORAL_TABLET | Freq: Four times a day (QID) | ORAL | 0 refills | Status: DC | PRN

## 2020-06-15 NOTE — Telephone Encounter (Signed)
I spoke with pt;pt said already had prescription for flomax and hydrocodone acetaminophen sent to pharmacy by Pamala Hurry NP. Pt said he would cb if he felt anything else needed. Pt said he frequently has kidney stones. Sending note to R BaityNP and Dr Ermalene Searing. Last annual visit 05/23/20 with Dr Ermalene Searing.

## 2020-06-15 NOTE — Telephone Encounter (Signed)
I was going to try to get him in for VV today but found out to late in the day, known kidney stones. I will have him do VV tomorrow with me, and drop off urine sample.

## 2020-06-15 NOTE — Telephone Encounter (Signed)
Noted  

## 2020-07-07 ENCOUNTER — Other Ambulatory Visit: Payer: Self-pay | Admitting: Internal Medicine

## 2020-08-05 ENCOUNTER — Other Ambulatory Visit: Payer: Self-pay | Admitting: Family Medicine

## 2020-08-07 NOTE — Telephone Encounter (Signed)
Devin Stanton called back and does not need this medication refilled.

## 2020-08-07 NOTE — Telephone Encounter (Signed)
Left message for Devin Stanton to call the office to confirm if he needs the Tamsulosin refilled.

## 2020-09-15 ENCOUNTER — Ambulatory Visit (INDEPENDENT_AMBULATORY_CARE_PROVIDER_SITE_OTHER): Payer: PRIVATE HEALTH INSURANCE | Admitting: *Deleted

## 2020-09-15 ENCOUNTER — Other Ambulatory Visit: Payer: Self-pay

## 2020-09-15 DIAGNOSIS — Z23 Encounter for immunization: Secondary | ICD-10-CM | POA: Diagnosis not present

## 2020-09-15 NOTE — Progress Notes (Signed)
Per orders of Dr. Diona Browner, injection of 2nd Shingrix Vaccine given by Lauralyn Primes. Patient tolerated injection well.

## 2021-01-09 ENCOUNTER — Other Ambulatory Visit: Payer: Self-pay | Admitting: Unknown Physician Specialty

## 2021-01-09 DIAGNOSIS — M25511 Pain in right shoulder: Secondary | ICD-10-CM | POA: Insufficient documentation

## 2021-01-09 DIAGNOSIS — R221 Localized swelling, mass and lump, neck: Secondary | ICD-10-CM

## 2021-01-22 ENCOUNTER — Other Ambulatory Visit: Payer: Self-pay

## 2021-01-22 ENCOUNTER — Ambulatory Visit
Admission: RE | Admit: 2021-01-22 | Discharge: 2021-01-22 | Disposition: A | Discharge status: Home / Self Care | Payer: No Typology Code available for payment source | Source: Ambulatory Visit | Attending: Unknown Physician Specialty | Admitting: Unknown Physician Specialty

## 2021-01-22 DIAGNOSIS — R221 Localized swelling, mass and lump, neck: Secondary | ICD-10-CM | POA: Diagnosis not present

## 2021-01-22 DIAGNOSIS — G5600 Carpal tunnel syndrome, unspecified upper limb: Secondary | ICD-10-CM | POA: Insufficient documentation

## 2021-01-22 LAB — POCT I-STAT CREATININE: Creatinine, Ser: 1.3 mg/dL — ABNORMAL HIGH (ref 0.61–1.24)

## 2021-01-22 MED ORDER — IOHEXOL 300 MG/ML  SOLN
75.0000 mL | Freq: Once | INTRAMUSCULAR | Status: AC | PRN
  Administered 2021-01-22: 75 mL via INTRAVENOUS

## 2021-01-24 DIAGNOSIS — M7582 Other shoulder lesions, left shoulder: Secondary | ICD-10-CM | POA: Insufficient documentation

## 2021-01-24 DIAGNOSIS — M75102 Unspecified rotator cuff tear or rupture of left shoulder, not specified as traumatic: Secondary | ICD-10-CM | POA: Insufficient documentation

## 2021-02-21 ENCOUNTER — Other Ambulatory Visit: Payer: Self-pay | Admitting: Unknown Physician Specialty

## 2021-02-21 DIAGNOSIS — D37030 Neoplasm of uncertain behavior of the parotid salivary glands: Secondary | ICD-10-CM

## 2021-03-12 ENCOUNTER — Encounter (HOSPITAL_COMMUNITY): Payer: Self-pay | Admitting: Radiology

## 2021-03-19 ENCOUNTER — Encounter: Payer: Self-pay | Admitting: Gastroenterology

## 2021-03-26 ENCOUNTER — Other Ambulatory Visit: Payer: Self-pay | Admitting: Unknown Physician Specialty

## 2021-03-26 DIAGNOSIS — R918 Other nonspecific abnormal finding of lung field: Secondary | ICD-10-CM

## 2021-04-04 ENCOUNTER — Other Ambulatory Visit: Payer: Self-pay

## 2021-04-04 ENCOUNTER — Ambulatory Visit
Admission: RE | Admit: 2021-04-04 | Discharge: 2021-04-04 | Disposition: A | Discharge status: Home / Self Care | Payer: No Typology Code available for payment source | Source: Ambulatory Visit | Attending: Unknown Physician Specialty | Admitting: Unknown Physician Specialty

## 2021-04-04 DIAGNOSIS — R918 Other nonspecific abnormal finding of lung field: Secondary | ICD-10-CM | POA: Diagnosis present

## 2021-04-17 ENCOUNTER — Encounter: Payer: Self-pay | Admitting: Thoracic Surgery (Cardiothoracic Vascular Surgery)

## 2021-04-17 ENCOUNTER — Institutional Professional Consult (permissible substitution) (INDEPENDENT_AMBULATORY_CARE_PROVIDER_SITE_OTHER): Payer: No Typology Code available for payment source | Admitting: Thoracic Surgery (Cardiothoracic Vascular Surgery)

## 2021-04-17 ENCOUNTER — Other Ambulatory Visit: Payer: Self-pay

## 2021-04-17 VITALS — BP 124/76 | HR 79 | Resp 20 | Ht 67.0 in | Wt 178.0 lb

## 2021-04-17 DIAGNOSIS — J9859 Other diseases of mediastinum, not elsewhere classified: Secondary | ICD-10-CM | POA: Diagnosis not present

## 2021-04-17 NOTE — Progress Notes (Signed)
PCP is Jinny Sanders, MD Referring Provider is Beverly Gust, MD  Chief Complaint  Patient presents with   Mediastinal Mass    Surgical consult, Chest CT 04/04/21,     HPI: [name] is sent for consultation regarding an anterior mediastinal nodule.  Devin Stanton is a 60 year old man with a past medical history significant for hypertension, hyperlipidemia, nephrolithiasis, and anterior cervical discectomy with neck pain.  He recently had an MRI of his neck to evaluate left arm numbness and paresthesias.  There was a parotid lesion.  He was referred to Dr. Tami Ribas.  He did a CT of the neck.  It showed a possible anterior mediastinal nodule.  That led to a CT of the chest which showed a 2 cm anterior mediastinal nodule.  Radiology noted in retrospect that nodule was present on a MRI of the thoracic spine from 2019.  He has left arm numbness and paresthesias.  He is not having any chest pain, pressure, or tightness.  He does get shortness of breath with heavy exertion.  No double vision or difficulty swallowing.   Past Medical History:  Diagnosis Date   Backache, unspecified    ED (erectile dysfunction)    History of kidney stones 1995   "passed them" (04/08/2016)   Hyperlipidemia    controlled medically   Unspecified essential hypertension    controlled   Unspecified personal history presenting hazards to health     Past Surgical History:  Procedure Laterality Date   ANTERIOR CERVICAL DECOMP/DISCECTOMY FUSION  06/2015   "C3-4-5"   BACK SURGERY     CARDIAC CATHETERIZATION N/A 04/09/2016   Procedure: Left Heart Cath and Coronary Angiography;  Surgeon: Leonie Man, MD;  Location: [place] [facility];  Service: Cardiovascular;  Laterality: N/A;   CYST EXCISION Left ~ 2013   "mucoid cyst; middle finger"   PERIPHERAL VASCULAR CATHETERIZATION N/A 04/09/2016   Procedure: Abdominal Aortogram;  Surgeon: Leonie Man, MD;  Location: [place] [facility];  Service:  Cardiovascular;  Laterality: N/A;   TONSILLECTOMY     VASECTOMY  ~ 2012    Family History  Problem Relation Age of Onset   Parkinsonism Mother    Macular degeneration Mother    Leukemia Father    Coronary artery disease Father    Heart disease Brother 47       CABG x 5   Heart disease Brother    Stroke Brother 50   Colon cancer Neg Hx    Esophageal cancer Neg Hx    Stomach cancer Neg Hx     Social History Social History   Tobacco Use   Smoking status: Never   Smokeless tobacco: Current    Types: Snuff  Substance Use Topics   Alcohol use: Yes    Alcohol/week: 8.0 standard drinks    Types: 4 Standard drinks or equivalent, 4 Shots of liquor per week   Drug use: No    Current Outpatient Medications  Medication Sig Dispense Refill   amLODipine (NORVASC) 10 MG tablet Take 1 tablet (10 mg total) by mouth daily. 90 tablet 3   aspirin 81 MG tablet Take 81 mg by mouth daily.     benazepril (LOTENSIN) 40 MG tablet Take 1 tablet (40 mg total) by mouth daily. 90 tablet 3   metoprolol succinate (TOPROL-XL) 50 MG 24 hr tablet Take 1 tablet (50 mg total) by mouth daily. Take with or immediately following a meal. 90 tablet 3   rosuvastatin (CRESTOR) 10  MG tablet Take 1 tablet (10 mg total) by mouth daily. 90 tablet 3   fluticasone (CUTIVATE) 0.005 % ointment Apply 1 application topically 2 (two) times daily. 30 g 0   HYDROcodone-acetaminophen (NORCO/VICODIN) 5-325 MG tablet Take 1 tablet by mouth every 6 (six) hours as needed for moderate pain. 15 tablet 0   tamsulosin (FLOMAX) 0.4 MG CAPS capsule TAKE 1 CAPSULE BY MOUTH EVERY DAY 30 capsule 0   No current facility-administered medications for this visit.    No Known Allergies  Review of Systems  Constitutional:  Negative for activity change, appetite change and unexpected weight change.  HENT:  Positive for hearing loss. Negative for trouble swallowing and voice change.   Eyes:  Negative for visual disturbance.  Respiratory:   Positive for shortness of breath.   Genitourinary:  Negative for difficulty urinating and dysuria.       Kidney stones  Musculoskeletal:  Positive for myalgias and neck pain.  All other systems reviewed and are negative.  BP 124/76   Pulse 79   Resp 20   Ht 5\' 7"  (1.702 m)   Wt 178 lb (80.7 kg)   SpO2 98% Comment: RA  BMI 27.88 kg/m  Physical Exam Vitals reviewed.  Constitutional:      General: He is not in acute distress.    Appearance: Normal appearance.  Eyes:     General: No scleral icterus.    Extraocular Movements: Extraocular movements intact.  Neck:     Vascular: No carotid bruit.  Cardiovascular:     Rate and Rhythm: Normal rate and regular rhythm.     Heart sounds: Normal heart sounds. No murmur heard.   No friction rub.  Pulmonary:     Effort: Pulmonary effort is normal. No respiratory distress.     Breath sounds: Normal breath sounds. No wheezing.  Abdominal:     General: There is no distension.     Palpations: Abdomen is soft.  Lymphadenopathy:     Cervical: No cervical adenopathy.  Skin:    General: Skin is warm and dry.  Neurological:     General: No focal deficit present.     Mental Status: He is alert and oriented to person, place, and time.     Cranial Nerves: No cranial nerve deficit.     Motor: No weakness.     Diagnostic Tests: CT CHEST WITHOUT CONTRAST   TECHNIQUE: Multidetector CT imaging of the chest was performed following the standard protocol without IV contrast.   COMPARISON:  Neck CT 01/22/2021 and prior CT abdomen 01/02/2020 and prior thoracic spine MRI from 2019   FINDINGS: Cardiovascular: The heart is normal in size. No pericardial effusion. The aorta is normal in caliber. Moderate age advanced atherosclerotic calcifications involving the descending thoracic aorta. There are also age advanced coronary artery calcifications.   Mediastinum/Nodes: There is a solid 2.0 x 1.5 cm lesion in the anterior mediastinum. In retrospect  this was present on a prior MRI of the thoracic spine 2019 where it measured approximately 18 mm. This is most likely a benign thymic lesion. I would recommend a follow-up MRI of the chest/mediastinum without and with contrast in 6 months to document stability.   Small scattered mediastinal and hilar nodes but no overt adenopathy. The esophagus is grossly normal. The thyroid gland is unremarkable.   Lungs/Pleura: No worrisome pulmonary lesions are identified. The ground-glass opacity seen on the prior abdominal CT scan have resolved. No pulmonary nodules. No pulmonary infiltrates or pleural effusions.  Linear right lower lobe scarring changes.   Upper Abdomen: Diffuse and marked fatty infiltration of the liver but no hepatic lesions are identified. No upper abdominal adenopathy. The adrenal glands are unremarkable.   Musculoskeletal: No significant bony findings.   IMPRESSION: 1. Resolution of the ground-glass opacities seen on the prior abdominal CT scan. This was likely inflammatory change. 2. No worrisome pulmonary lesions or acute pulmonary findings. 3. 2.0 x 1.5 cm anterior mediastinal lesion. This appears to be stable when compared to a prior MRI of the thoracic spine from 2019. This is most likely a benign thymic lesion. I would recommend a follow-up MRI of the chest/mediastinum without and with contrast in 6 months to document stability. 4. Age advanced atherosclerotic calcifications involving the thoracic aorta and coronary arteries. 5. Diffuse and marked fatty infiltration of the liver.   Aortic Atherosclerosis (ICD10-I70.0).     Electronically Signed   By: Marijo Sanes M.D.   On: 04/06/2021 15:43  I personally reviewed the CT images.  There is a 2 x 1.5 cm well-circumscribed anterior mediastinal nodule.    Impression: Devin Stanton is a with a past medical history significant for hypertension, hyperlipidemia, nephrolithiasis, and anterior cervical discectomy  with neck pain.   Work-up for left arm numbness and paresthesias ultimately led to a CT of the chest which shows a 2 x 1.5 cm anterior mediastinal nodule.  Of note this nodule was present on MRI of the thoracic spine done a few years ago.  It has not changed significantly in that time.  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  Ectopic thyroid nodule and adenopathy are also in the differential diagnosis.  Far less likely.  This nodule is well-circumscribed with no evidence of any invasion of surrounding structures.  Relatively stable over a multiyear.  I recommended we follow-up with a MRI with and without contrast in 6 months to follow-up the nodule.  If it does increase in size they could be resected robotically.  CAD noted on CT scan.  He is on statin and aspirin.  Plan: Return in 6 months with MR chest with and without contrast  Melrose Nakayama, MD [facility] [phone]

## 2021-04-26 ENCOUNTER — Other Ambulatory Visit: Payer: Self-pay | Admitting: Surgery

## 2021-04-26 DIAGNOSIS — M75112 Incomplete rotator cuff tear or rupture of left shoulder, not specified as traumatic: Secondary | ICD-10-CM

## 2021-04-26 DIAGNOSIS — M7582 Other shoulder lesions, left shoulder: Secondary | ICD-10-CM

## 2021-05-06 ENCOUNTER — Other Ambulatory Visit: Payer: Self-pay

## 2021-05-06 ENCOUNTER — Ambulatory Visit
Admission: RE | Admit: 2021-05-06 | Discharge: 2021-05-06 | Disposition: A | Discharge status: Home / Self Care | Payer: No Typology Code available for payment source | Source: Ambulatory Visit | Attending: Surgery | Admitting: Surgery

## 2021-05-06 DIAGNOSIS — M7582 Other shoulder lesions, left shoulder: Secondary | ICD-10-CM

## 2021-05-06 DIAGNOSIS — M75112 Incomplete rotator cuff tear or rupture of left shoulder, not specified as traumatic: Secondary | ICD-10-CM

## 2021-06-14 ENCOUNTER — Ambulatory Visit: Admission: RE | Admit: 2021-06-14 | Payer: Medicare Other | Source: Ambulatory Visit

## 2021-06-26 ENCOUNTER — Encounter: Payer: Self-pay | Admitting: Family Medicine

## 2021-06-26 ENCOUNTER — Other Ambulatory Visit: Payer: Self-pay | Admitting: Unknown Physician Specialty

## 2021-06-26 ENCOUNTER — Other Ambulatory Visit: Payer: Self-pay

## 2021-06-26 ENCOUNTER — Ambulatory Visit (INDEPENDENT_AMBULATORY_CARE_PROVIDER_SITE_OTHER): Payer: PRIVATE HEALTH INSURANCE | Admitting: Family Medicine

## 2021-06-26 VITALS — BP 120/76 | HR 78 | Temp 97.8°F | Ht 67.5 in | Wt 177.8 lb

## 2021-06-26 DIAGNOSIS — R7303 Prediabetes: Secondary | ICD-10-CM

## 2021-06-26 DIAGNOSIS — I10 Essential (primary) hypertension: Secondary | ICD-10-CM | POA: Diagnosis not present

## 2021-06-26 DIAGNOSIS — Z0001 Encounter for general adult medical examination with abnormal findings: Secondary | ICD-10-CM | POA: Diagnosis not present

## 2021-06-26 DIAGNOSIS — E785 Hyperlipidemia, unspecified: Secondary | ICD-10-CM

## 2021-06-26 DIAGNOSIS — G2581 Restless legs syndrome: Secondary | ICD-10-CM | POA: Diagnosis not present

## 2021-06-26 DIAGNOSIS — Z0000 Encounter for general adult medical examination without abnormal findings: Secondary | ICD-10-CM

## 2021-06-26 DIAGNOSIS — Z125 Encounter for screening for malignant neoplasm of prostate: Secondary | ICD-10-CM

## 2021-06-26 DIAGNOSIS — D37030 Neoplasm of uncertain behavior of the parotid salivary glands: Secondary | ICD-10-CM

## 2021-06-26 MED ORDER — ROPINIROLE HCL 0.25 MG PO TABS: 0.2500 mg | ORAL_TABLET | Freq: Every day | ORAL | 11 refills | Status: DC

## 2021-06-26 NOTE — Assessment & Plan Note (Signed)
Stable, chronic.  Continue current medication.   Good control on amlodipine 10 mg daily, benazepril 40 mg daily, metoprolol 50 mg daily 

## 2021-06-26 NOTE — Patient Instructions (Addendum)
Return for labs fasting when able.  Start a trial of ropinirole at night.. increase to 2 tabs at night if not improving.Marland Kitchen up 4 tabs at night.  Call re: colonoscopy when you are able. Dr. Eugenia Pancoast Call [phone].

## 2021-06-26 NOTE — Assessment & Plan Note (Signed)
Due for re-eval. 

## 2021-06-26 NOTE — Assessment & Plan Note (Signed)
Trial of  Ropinirole.. titrate up to lowest effective dose.

## 2021-06-26 NOTE — Assessment & Plan Note (Signed)
Due for re-eval on crestor 10 mg daily. Encouraged exercise, weight loss, healthy eating habits.

## 2021-06-26 NOTE — Progress Notes (Signed)
Patient ID: Devin Stanton, male    DOB: 1961/04/27, 61 y.o.   MRN: [id number]  This visit was conducted in person.  BP 120/76    Pulse 78    Temp 97.8 F (36.6 C) (Temporal)    Ht 5' 7.5" (1.715 m)    Wt 177 lb 12 oz (80.6 kg)    SpO2 97%    BMI 27.43 kg/m    CC: Welcome to Medicare   Subjective:   HPI: Devin Stanton is a 61 y.o. male presenting on 06/26/2021 for  Welcome to [employer].   I have personally reviewed the Medicare Annual Wellness questionnaire and have noted 1. The patient's medical and social history 2. Their use of alcohol, tobacco or illicit drugs 3. Their current medications and supplements 4. The patient's functional ability including ADL's, fall risks, home safety risks and hearing or visual             impairment. 5. Diet and physical activities 6. Evidence for depression or mood disorders 7.         Updated provider list Cognitive evaluation was performed and recorded on pt medicare questionnaire form. The patients weight, height, BMI and visual acuity have been recorded in the chart   I have made referrals, counseling and provided education to the patient based review of the above and I have provided the pt with a written personalized care plan for preventive services.   Documentation of this information was scanned into the electronic record under the media tab.   Advance directives and end of life planning reviewed in detail with patient and documented in EMR. Patient given handout on advance care directives if needed. HCPOA and living will updated if needed.  [place] Visit from 06/26/2021 in [place] at [facility] Total Score 0      No falls  Hearing followed by audiology Vision followed by opthalmology.. recent visit.  Hypertension:    Good control on amlodipine 10 mg daily, benazepril 40 mg daily, metoprolol 50 mg daily BP Readings from Last 3 Encounters:  06/26/21 120/76  04/17/21 124/76  05/23/20  118/64  Using medication without problems or lightheadedness:  Chest pain with exertion: Edema: Short of breath: Average home BPs: Other issues:  Elevated Cholesterol: Due for re-eval. Crestor 10 mg daily Using medications without problems: Muscle aches:  Diet compliance: moderate Exercise: minimal  given left shoulder Other complaints:   Prediabetes Due for re-eval.    Leg jumping at night when sound asleep. Ongoing for years.. bothers wife more than him.  Legs feel restless when sitting long on couch, no cramps.   Relevant past medical, surgical, family and social history reviewed and updated as indicated. Interim medical history since our last visit reviewed. Allergies and medications reviewed and updated. Outpatient Medications Prior to Visit  Medication Sig Dispense Refill   amLODipine (NORVASC) 10 MG tablet Take 1 tablet (10 mg total) by mouth daily. 90 tablet 3   aspirin 81 MG tablet Take 81 mg by mouth daily.     benazepril (LOTENSIN) 40 MG tablet Take 1 tablet (40 mg total) by mouth daily. 90 tablet 3   clobetasol cream (TEMOVATE) 3.32 % Apply 1 application topically 2 (two) times daily as needed (irritation).     meloxicam (MOBIC) 15 MG tablet Take 15 mg by mouth daily as needed for pain.     metoprolol succinate (TOPROL-XL) 50 MG 24 hr tablet Take 1 tablet (50 mg  total) by mouth daily. Take with or immediately following a meal. 90 tablet 3   rosuvastatin (CRESTOR) 10 MG tablet Take 1 tablet (10 mg total) by mouth daily. 90 tablet 3   No facility-administered medications prior to visit.     Per HPI unless specifically indicated in ROS section below Review of Systems  Constitutional:  Negative for fatigue and fever.  HENT:  Negative for ear pain.   Eyes:  Negative for pain.  Respiratory:  Negative for cough and shortness of breath.   Cardiovascular:  Negative for chest pain, palpitations and leg swelling.  Gastrointestinal:  Negative for abdominal pain.   Genitourinary:  Negative for dysuria.  Musculoskeletal:  Negative for arthralgias.  Neurological:  Negative for syncope, light-headedness and headaches.  Psychiatric/Behavioral:  Negative for dysphoric mood.   Objective:  BP 120/76    Pulse 78    Temp 97.8 F (36.6 C) (Temporal)    Ht 5' 7.5" (1.715 m)    Wt 177 lb 12 oz (80.6 kg)    SpO2 97%    BMI 27.43 kg/m   Wt Readings from Last 3 Encounters:  06/26/21 177 lb 12 oz (80.6 kg)  04/17/21 178 lb (80.7 kg)  05/23/20 178 lb 6.4 oz (80.9 kg)      Physical Exam    Results for orders placed or performed during the hospital encounter of 01/22/21  I-STAT creatinine  Result Value Ref Range   Creatinine, Ser 1.30 (H) 0.61 - 1.24 mg/dL    This visit occurred during the SARS-CoV-2 public health emergency.  Safety protocols were in place, including screening questions prior to the visit, additional usage of staff PPE, and extensive cleaning of exam room while observing appropriate contact time as indicated for disinfecting solutions.   COVID 19 screen:  No recent travel or known exposure to COVID19 The patient denies respiratory symptoms of COVID 19 at this time. The importance of social distancing was discussed today.   Assessment and Plan   The patient's preventative maintenance and recommended screening tests for an annual wellness exam were reviewed in full today. Brought up to date unless services declined.  Counselled on the importance of diet, exercise, and its role in overall health and mortality. The patient's FH and SH was reviewed, including their home life, tobacco status, and drug and alcohol status.   Refused HIV. Check PSA... today  Uptodate with colonoscopy.Marland Kitchen Next due 2022 DUE NOW Hep C done  Vaccine: uptodate with TDap . Recommended COVID vaccine and flu vaccine.. pt refused. Uptodate with shingrix.  Problem List Items Addressed This Visit     Essential hypertension, benign (Chronic)    Stable, chronic.  Continue  current medication.   Good control on amlodipine 10 mg daily, benazepril 40 mg daily, metoprolol 50 mg daily      Hyperlipidemia (Chronic)    Due for re-eval on crestor 10 mg daily. Encouraged exercise, weight loss, healthy eating habits.       Relevant Orders   Lipid panel   Comprehensive metabolic panel   Prediabetes    Due for re-eval.      Relevant Orders   Hemoglobin A1c   Restless leg syndrome    Trial of  Ropinirole.. titrate up to lowest effective dose.      Other Visit Diagnoses     Welcome to Medicare preventive visit    -  Primary   Essential hypertension       Prostate cancer screening  Relevant Orders   PSA, Medicare       Eliezer Lofts, MD

## 2021-06-29 ENCOUNTER — Other Ambulatory Visit: Payer: Self-pay | Admitting: Surgery

## 2021-07-03 ENCOUNTER — Encounter
Admission: RE | Admit: 2021-07-03 | Discharge: 2021-07-03 | Disposition: A | Discharge status: Home / Self Care | Payer: Medicare Other | Source: Ambulatory Visit | Attending: Surgery | Admitting: Surgery

## 2021-07-03 ENCOUNTER — Encounter: Payer: Self-pay | Admitting: Urgent Care

## 2021-07-03 ENCOUNTER — Other Ambulatory Visit: Payer: Self-pay

## 2021-07-03 VITALS — Ht 67.0 in | Wt 175.0 lb

## 2021-07-03 DIAGNOSIS — E785 Hyperlipidemia, unspecified: Secondary | ICD-10-CM | POA: Diagnosis not present

## 2021-07-03 DIAGNOSIS — Z01818 Encounter for other preprocedural examination: Secondary | ICD-10-CM | POA: Insufficient documentation

## 2021-07-03 DIAGNOSIS — R7303 Prediabetes: Secondary | ICD-10-CM | POA: Insufficient documentation

## 2021-07-03 DIAGNOSIS — I10 Essential (primary) hypertension: Secondary | ICD-10-CM

## 2021-07-03 DIAGNOSIS — Z01810 Encounter for preprocedural cardiovascular examination: Secondary | ICD-10-CM | POA: Diagnosis not present

## 2021-07-03 HISTORY — DX: COVID-19: U07.1

## 2021-07-03 LAB — CBC
HCT: 39.1 % (ref 39.0–52.0)
Hemoglobin: 13.3 g/dL (ref 13.0–17.0)
MCH: 31.1 pg (ref 26.0–34.0)
MCHC: 34 g/dL (ref 30.0–36.0)
MCV: 91.6 fL (ref 80.0–100.0)
Platelets: 295 10*3/uL (ref 150–400)
RBC: 4.27 MIL/uL (ref 4.22–5.81)
RDW: 12.3 % (ref 11.5–15.5)
WBC: 10.7 10*3/uL — ABNORMAL HIGH (ref 4.0–10.5)
nRBC: 0 % (ref 0.0–0.2)

## 2021-07-03 LAB — BASIC METABOLIC PANEL
Anion gap: 11 (ref 5–15)
BUN: 15 mg/dL (ref 8–23)
CO2: 22 mmol/L (ref 22–32)
Calcium: 9.5 mg/dL (ref 8.9–10.3)
Chloride: 99 mmol/L (ref 98–111)
Creatinine, Ser: 1.46 mg/dL — ABNORMAL HIGH (ref 0.61–1.24)
GFR, Estimated: 54 mL/min — ABNORMAL LOW (ref >60)
Glucose, Bld: 107 mg/dL — ABNORMAL HIGH (ref 70–99)
Potassium: 4 mmol/L (ref 3.5–5.1)
Sodium: 132 mmol/L — ABNORMAL LOW (ref 135–145)

## 2021-07-03 NOTE — Patient Instructions (Addendum)
Your procedure is scheduled on: 07/12/21 Report to [place]. To find out your arrival time please call [phone] between 1PM - 3PM on 07/11/21.  Remember: Instructions that are not followed completely may result in serious medical risk, up to and including death, or upon the discretion of your surgeon and anesthesiologist your surgery may need to be rescheduled.     _X__ 1. Do not eat food after midnight the night before your procedure.                 No gum chewing or hard candies. You may drink clear liquids up to 2 hours                 before you are scheduled to arrive for your surgery- DO not drink clear                 liquids within 2 hours of the start of your surgery.                 Clear Liquids include:  water, apple juice without pulp, clear carbohydrate                 drink such as Clearfast or Gatorade, Black Coffee or Tea (Do not add                 anything to coffee or tea). Diabetics water only  Drink the Ensure "clear" Pre Surgery Drink 2 hours prior to arrival to surgery  __X__2.  On the morning of surgery brush your teeth with toothpaste and water, you                 may rinse your mouth with mouthwash if you wish.  Do not swallow any              toothpaste of mouthwash.     _X__ 3.  No Alcohol for 24 hours before or after surgery.   _X__ 4.  Do Not Smoke or use e-cigarettes For 24 Hours Prior to Your Surgery.                 Do not use any chewable tobacco products for at least 6 hours prior to                 surgery.  ____  5.  Bring all medications with you on the day of surgery if instructed.   __X__  6.  Notify your doctor if there is any change in your medical condition      (cold, fever, infections).     Do not wear jewelry, make-up, hairpins, clips or nail polish. Do not wear lotions, powders, or perfumes. No Deodorant Do not shave body hair 48 hours prior to surgery. Men may shave face and  neck. Do not bring valuables to the hospital.    [facility] is not responsible for any belongings or valuables.  Contacts, dentures/partials or body piercings may not be worn into surgery. Bring a case for your contacts, glasses or hearing aids, a denture cup will be supplied. Leave your suitcase in the car. After surgery it may be brought to your room. For patients admitted to the hospital, discharge time is determined by your treatment team.   Patients discharged the day of surgery will not be allowed to drive home.   Please read over the following fact sheets that you were given:  CHG soap, Ensure drink, Incentive Spirometer  __X__ Take these medicines the morning of surgery with A SIP OF WATER:    1. amLODipine (NORVASC) 10 MG tablet  2. metoprolol succinate (TOPROL-XL) 50 MG 24 hr tablet  3. rosuvastatin (CRESTOR) 10 MG tablet  4.  5.  6.  ____ Fleet Enema (as directed)   __X__ Use CHG Soap/SAGE wipes as directed  ____ Use inhalers on the day of surgery  ____ Stop metformin/Janumet/Farxiga 2 days prior to surgery    ____ Take 1/2 of usual insulin dose the night before surgery. No insulin the morning          of surgery.   ____ Stop Blood Thinners Coumadin/Plavix/Xarelto/Pleta/Pradaxa/Eliquis/Effient/Aspirin  on   Or contact your Surgeon, Cardiologist or Medical Doctor regarding  ability to stop your blood thinners  __X__ Stop Anti-inflammatories 7 days before surgery such as Advil, Ibuprofen, Motrin,  BC or Goodies Powder, Naprosyn, Naproxen, Aleve, Aspirin    __X__ Stop all herbals and supplements, fish oil or vitamins 1 week prior to surgery.    ____ Bring C-Pap to the hospital.      How to Use an Incentive Spirometer An incentive spirometer is a tool that measures how well you are filling your lungs with each breath. Learning to take long, deep breaths using this tool can help you keep your lungs clear and active. This may help to reverse or lessen your chance  of developing breathing (pulmonary) problems, especially infection. You may be asked to use a spirometer: After a surgery. If you have a lung problem or a history of smoking. After a long period of time when you have been unable to move or be active. If the spirometer includes an indicator to show the highest number that you have reached, your health care provider or respiratory therapist will help you set a goal. Keep a log of your progress as told by your health care provider. What are the risks? Breathing too quickly may cause dizziness or cause you to pass out. Take your time so you do not get dizzy or light-headed. If you are in pain, you may need to take pain medicine before doing incentive spirometry. It is harder to take a deep breath if you are having pain. How to use your incentive spirometer  Sit up on the edge of your bed or on a chair. Hold the incentive spirometer so that it is in an upright position. Before you use the spirometer, breathe out normally. Place the mouthpiece in your mouth. Make sure your lips are closed tightly around it. Breathe in slowly and as deeply as you can through your mouth, causing the piston or the ball to rise toward the top of the chamber. Hold your breath for 3-5 seconds, or for as long as possible. If the spirometer includes a coach indicator, use this to guide you in breathing. Slow down your breathing if the indicator goes above the marked areas. Remove the mouthpiece from your mouth and breathe out normally. The piston or ball will return to the bottom of the chamber. Rest for a few seconds, then repeat the steps 10 or more times. Take your time and take a few normal breaths between deep breaths so that you do not get dizzy or light-headed. Do this every 1-2 hours when you are awake. If the spirometer includes a goal marker to show the highest number you have reached (best effort), use this as a goal to work toward during each repetition.  After  each set of 10 deep breaths, cough a few times. This will help to make sure that your lungs are clear. If you have an incision on your chest or abdomen from surgery, place a pillow or a rolled-up towel firmly against the incision when you cough. This can help to reduce pain while taking deep breaths and coughing. General tips When you are able to get out of bed: Walk around often. Continue to take deep breaths and cough in order to clear your lungs. Keep using the incentive spirometer until your health care provider says it is okay to stop using it. If you have been in the hospital, you may be told to keep using the spirometer at home. Contact a health care provider if: You are having difficulty using the spirometer. You have trouble using the spirometer as often as instructed. Your pain medicine is not giving enough relief for you to use the spirometer as told. You have a fever. Get help right away if: You develop shortness of breath. You develop a cough with bloody mucus from the lungs. You have fluid or blood coming from an incision site after you cough. Summary An incentive spirometer is a tool that can help you learn to take long, deep breaths to keep your lungs clear and active. You may be asked to use a spirometer after a surgery, if you have a lung problem or a history of smoking, or if you have been inactive for a long period of time. Use your incentive spirometer as instructed every 1-2 hours while you are awake. If you have an incision on your chest or abdomen, place a pillow or a rolled-up towel firmly against your incision when you cough. This will help to reduce pain. Get help right away if you have shortness of breath, you cough up bloody mucus, or blood comes from your incision when you cough. This information is not intended to replace advice given to you by your health care provider. Make sure you discuss any questions you have with your health care provider. Document  Revised: 08/16/2019 Document Reviewed: 08/16/2019 Elsevier Patient Education  [place].

## 2021-07-12 ENCOUNTER — Ambulatory Visit: Payer: Medicare Other

## 2021-07-12 ENCOUNTER — Other Ambulatory Visit: Payer: Self-pay

## 2021-07-12 ENCOUNTER — Encounter
Admission: AD | Disposition: A | Discharge status: Home / Self Care | Payer: Self-pay | Source: Home / Self Care | Attending: Surgery

## 2021-07-12 ENCOUNTER — Ambulatory Visit
Admission: AD | Admit: 2021-07-12 | Discharge: 2021-07-12 | Disposition: A | Discharge status: Home / Self Care | Payer: Medicare Other | Attending: Surgery | Admitting: Surgery

## 2021-07-12 ENCOUNTER — Encounter: Payer: Self-pay | Admitting: Surgery

## 2021-07-12 ENCOUNTER — Ambulatory Visit: Payer: Medicare Other | Admitting: Anesthesiology

## 2021-07-12 ENCOUNTER — Other Ambulatory Visit: Payer: Self-pay | Admitting: Family Medicine

## 2021-07-12 ENCOUNTER — Ambulatory Visit: Payer: Medicare Other | Admitting: Urgent Care

## 2021-07-12 DIAGNOSIS — Z419 Encounter for procedure for purposes other than remedying health state, unspecified: Secondary | ICD-10-CM

## 2021-07-12 DIAGNOSIS — G2581 Restless legs syndrome: Secondary | ICD-10-CM | POA: Diagnosis not present

## 2021-07-12 DIAGNOSIS — Z87891 Personal history of nicotine dependence: Secondary | ICD-10-CM | POA: Diagnosis not present

## 2021-07-12 DIAGNOSIS — M7522 Bicipital tendinitis, left shoulder: Secondary | ICD-10-CM | POA: Diagnosis not present

## 2021-07-12 DIAGNOSIS — M7542 Impingement syndrome of left shoulder: Secondary | ICD-10-CM | POA: Insufficient documentation

## 2021-07-12 DIAGNOSIS — I10 Essential (primary) hypertension: Secondary | ICD-10-CM | POA: Diagnosis not present

## 2021-07-12 HISTORY — PX: SHOULDER ARTHROSCOPY WITH BICEPS TENDON REPAIR: SHX5674

## 2021-07-12 SURGERY — SHOULDER ARTHROSCOPY WITH BICEPS TENDON REPAIR
Anesthesia: General | Site: Shoulder | Laterality: Left

## 2021-07-12 MED ORDER — ONDANSETRON HCL 4 MG/2ML IJ SOLN
INTRAMUSCULAR | Status: DC | PRN
  Administered 2021-07-12: 4 mg via INTRAVENOUS

## 2021-07-12 MED ORDER — ORAL CARE MOUTH RINSE: 15.0000 mL | Freq: Once | OROMUCOSAL | Status: AC

## 2021-07-12 MED ORDER — CHLORHEXIDINE GLUCONATE 0.12 % MT SOLN
OROMUCOSAL | Status: AC
  Administered 2021-07-12: 15 mL via OROMUCOSAL
  Filled 2021-07-12: qty 15

## 2021-07-12 MED ORDER — PHENYLEPHRINE HCL-NACL 20-0.9 MG/250ML-% IV SOLN
INTRAVENOUS | Status: DC | PRN
  Administered 2021-07-12: 25 ug/min via INTRAVENOUS

## 2021-07-12 MED ORDER — MIDAZOLAM HCL 2 MG/2ML IJ SOLN: 1.0000 mg | Freq: Once | INTRAMUSCULAR | Status: AC

## 2021-07-12 MED ORDER — DEXAMETHASONE SODIUM PHOSPHATE 10 MG/ML IJ SOLN
INTRAMUSCULAR | Status: DC | PRN
  Administered 2021-07-12: 5 mg via INTRAVENOUS

## 2021-07-12 MED ORDER — FENTANYL CITRATE (PF) 100 MCG/2ML IJ SOLN: 25.0000 ug | INTRAMUSCULAR | Status: DC | PRN

## 2021-07-12 MED ORDER — ONDANSETRON HCL 4 MG/2ML IJ SOLN: 4.0000 mg | Freq: Once | INTRAMUSCULAR | Status: AC | PRN

## 2021-07-12 MED ORDER — BUPIVACAINE LIPOSOME 1.3 % IJ SUSP
INTRAMUSCULAR | Status: AC
  Filled 2021-07-12: qty 10

## 2021-07-12 MED ORDER — CEFAZOLIN SODIUM-DEXTROSE 2-4 GM/100ML-% IV SOLN
2.0000 g | INTRAVENOUS | Status: AC
  Administered 2021-07-12: 2 g via INTRAVENOUS

## 2021-07-12 MED ORDER — SODIUM CHLORIDE 0.9 % IV SOLN: INTRAVENOUS | Status: DC

## 2021-07-12 MED ORDER — PHENYLEPHRINE HCL (PRESSORS) 10 MG/ML IV SOLN
INTRAVENOUS | Status: DC | PRN
Start: 2021-07-12 — End: 2021-07-12
  Administered 2021-07-12: 120 ug via INTRAVENOUS

## 2021-07-12 MED ORDER — ONDANSETRON HCL 4 MG/2ML IJ SOLN: 4.0000 mg | Freq: Four times a day (QID) | INTRAMUSCULAR | Status: DC | PRN

## 2021-07-12 MED ORDER — PHENYLEPHRINE HCL-NACL 20-0.9 MG/250ML-% IV SOLN
INTRAVENOUS | Status: AC
  Filled 2021-07-12: qty 250

## 2021-07-12 MED ORDER — ACETAMINOPHEN 10 MG/ML IV SOLN
INTRAVENOUS | Status: DC | PRN
Start: 2021-07-12 — End: 2021-07-12
  Administered 2021-07-12: 1000 mg via INTRAVENOUS

## 2021-07-12 MED ORDER — MIDAZOLAM HCL 2 MG/2ML IJ SOLN
INTRAMUSCULAR | Status: AC
  Administered 2021-07-12: 1 mg via INTRAVENOUS
  Filled 2021-07-12: qty 2

## 2021-07-12 MED ORDER — MIDAZOLAM HCL 2 MG/2ML IJ SOLN
1.0000 mg | Freq: Once | INTRAMUSCULAR | Status: AC
  Administered 2021-07-12: 1 mg via INTRAVENOUS

## 2021-07-12 MED ORDER — FENTANYL CITRATE (PF) 100 MCG/2ML IJ SOLN
INTRAMUSCULAR | Status: AC
  Filled 2021-07-12: qty 2

## 2021-07-12 MED ORDER — FENTANYL CITRATE PF 50 MCG/ML IJ SOSY
PREFILLED_SYRINGE | INTRAMUSCULAR | Status: AC
  Administered 2021-07-12: 100 ug via INTRAVENOUS
  Filled 2021-07-12: qty 2

## 2021-07-12 MED ORDER — MEPERIDINE HCL 25 MG/ML IJ SOLN: 6.2500 mg | INTRAMUSCULAR | Status: DC | PRN

## 2021-07-12 MED ORDER — BUPIVACAINE-EPINEPHRINE (PF) 0.5% -1:200000 IJ SOLN
INTRAMUSCULAR | Status: AC
  Filled 2021-07-12: qty 30

## 2021-07-12 MED ORDER — OXYCODONE HCL 5 MG PO TABS: 5.0000 mg | ORAL_TABLET | ORAL | Status: DC | PRN

## 2021-07-12 MED ORDER — LACTATED RINGERS IV SOLN: INTRAVENOUS | Status: DC

## 2021-07-12 MED ORDER — METOCLOPRAMIDE HCL 10 MG PO TABS: 5.0000 mg | ORAL_TABLET | Freq: Three times a day (TID) | ORAL | Status: DC | PRN

## 2021-07-12 MED ORDER — CHLORHEXIDINE GLUCONATE 0.12 % MT SOLN: 15.0000 mL | Freq: Once | OROMUCOSAL | Status: AC

## 2021-07-12 MED ORDER — ONDANSETRON HCL 4 MG PO TABS: 4.0000 mg | ORAL_TABLET | Freq: Four times a day (QID) | ORAL | Status: DC | PRN

## 2021-07-12 MED ORDER — ROCURONIUM BROMIDE 100 MG/10ML IV SOLN
INTRAVENOUS | Status: DC | PRN
  Administered 2021-07-12: 50 mg via INTRAVENOUS

## 2021-07-12 MED ORDER — BUPIVACAINE HCL (PF) 0.5 % IJ SOLN
INTRAMUSCULAR | Status: AC
  Filled 2021-07-12: qty 20

## 2021-07-12 MED ORDER — BUPIVACAINE LIPOSOME 1.3 % IJ SUSP
INTRAMUSCULAR | Status: DC | PRN
  Administered 2021-07-12: 10 mL

## 2021-07-12 MED ORDER — LIDOCAINE HCL (CARDIAC) PF 100 MG/5ML IV SOSY
PREFILLED_SYRINGE | INTRAVENOUS | Status: DC | PRN
Start: 2021-07-12 — End: 2021-07-12
  Administered 2021-07-12: 100 mg via INTRAVENOUS

## 2021-07-12 MED ORDER — PROPOFOL 10 MG/ML IV BOLUS
INTRAVENOUS | Status: DC | PRN
Start: 2021-07-12 — End: 2021-07-12
  Administered 2021-07-12: 160 mg via INTRAVENOUS
  Administered 2021-07-12: 40 mg via INTRAVENOUS

## 2021-07-12 MED ORDER — EPINEPHRINE PF 1 MG/ML IJ SOLN
INTRAMUSCULAR | Status: AC
  Filled 2021-07-12: qty 2

## 2021-07-12 MED ORDER — FENTANYL CITRATE PF 50 MCG/ML IJ SOSY: 100.0000 ug | PREFILLED_SYRINGE | Freq: Once | INTRAMUSCULAR | Status: AC

## 2021-07-12 MED ORDER — FENTANYL CITRATE (PF) 100 MCG/2ML IJ SOLN
INTRAMUSCULAR | Status: DC | PRN
Start: 2021-07-12 — End: 2021-07-12
  Administered 2021-07-12 (×2): 50 ug via INTRAVENOUS

## 2021-07-12 MED ORDER — FAMOTIDINE 20 MG PO TABS
ORAL_TABLET | ORAL | Status: AC
  Administered 2021-07-12: 20 mg via ORAL
  Filled 2021-07-12: qty 1

## 2021-07-12 MED ORDER — BUPIVACAINE-EPINEPHRINE 0.5% -1:200000 IJ SOLN
INTRAMUSCULAR | Status: DC | PRN
  Administered 2021-07-12: 30 mL

## 2021-07-12 MED ORDER — CEFAZOLIN SODIUM-DEXTROSE 2-4 GM/100ML-% IV SOLN
INTRAVENOUS | Status: AC
  Filled 2021-07-12: qty 100

## 2021-07-12 MED ORDER — SUGAMMADEX SODIUM 200 MG/2ML IV SOLN
INTRAVENOUS | Status: DC | PRN
  Administered 2021-07-12: 200 mg via INTRAVENOUS

## 2021-07-12 MED ORDER — FAMOTIDINE 20 MG PO TABS: 20.0000 mg | ORAL_TABLET | Freq: Once | ORAL | Status: AC

## 2021-07-12 MED ORDER — OXYCODONE HCL 5 MG PO TABS: 5.0000 mg | ORAL_TABLET | ORAL | 0 refills | Status: DC | PRN

## 2021-07-12 MED ORDER — ONDANSETRON HCL 4 MG/2ML IJ SOLN
INTRAMUSCULAR | Status: AC
  Administered 2021-07-12: 4 mg via INTRAVENOUS
  Filled 2021-07-12: qty 2

## 2021-07-12 MED ORDER — METOCLOPRAMIDE HCL 5 MG/ML IJ SOLN: 5.0000 mg | Freq: Three times a day (TID) | INTRAMUSCULAR | Status: DC | PRN

## 2021-07-12 MED ORDER — BUPIVACAINE HCL (PF) 0.5 % IJ SOLN
INTRAMUSCULAR | Status: DC | PRN
  Administered 2021-07-12: 20 mL via PERINEURAL

## 2021-07-12 SURGICAL SUPPLY — 50 items
ANCH SUT 2 2.9 2 LD TPR NDL (Anchor) ×1 IMPLANT
ANCHOR JUGGERKNOT WTAP NDL 2.9 (Anchor) ×1 IMPLANT
APL PRP STRL LF DISP 70% ISPRP (MISCELLANEOUS) ×1
BIT DRILL JUGRKNT W/NDL BIT2.9 (DRILL) IMPLANT
BLADE FULL RADIUS 3.5 (BLADE) ×2 IMPLANT
BUR ACROMIONIZER 4.0 (BURR) ×2 IMPLANT
CANNULA SHAVER 8MMX76MM (CANNULA) ×2 IMPLANT
CHLORAPREP W/TINT 26 (MISCELLANEOUS) ×2 IMPLANT
COVER MAYO STAND REUSABLE (DRAPES) ×2 IMPLANT
DILATOR 5.5 THREADED HEALICOIL (MISCELLANEOUS) IMPLANT
DRILL JUGGERKNOT W/NDL BIT 2.9 (DRILL) ×2
ELECT CAUTERY BLADE 6.4 (BLADE) ×2 IMPLANT
ELECT REM PT RETURN 9FT ADLT (ELECTROSURGICAL) ×2
ELECTRODE REM PT RTRN 9FT ADLT (ELECTROSURGICAL) ×1 IMPLANT
GAUZE SPONGE 4X4 12PLY STRL (GAUZE/BANDAGES/DRESSINGS) ×2 IMPLANT
GAUZE XEROFORM 1X8 LF (GAUZE/BANDAGES/DRESSINGS) ×2 IMPLANT
GLOVE SRG 8 PF TXTR STRL LF DI (GLOVE) ×1 IMPLANT
GLOVE SURG ENC MOIS LTX SZ7.5 (GLOVE) ×4 IMPLANT
GLOVE SURG ENC MOIS LTX SZ8 (GLOVE) ×4 IMPLANT
GLOVE SURG UNDER LTX SZ8 (GLOVE) ×2 IMPLANT
GLOVE SURG UNDER POLY LF SZ8 (GLOVE) ×2
GOWN STRL REUS W/ TWL LRG LVL3 (GOWN DISPOSABLE) ×1 IMPLANT
GOWN STRL REUS W/ TWL XL LVL3 (GOWN DISPOSABLE) ×1 IMPLANT
GOWN STRL REUS W/TWL LRG LVL3 (GOWN DISPOSABLE) ×2
GOWN STRL REUS W/TWL XL LVL3 (GOWN DISPOSABLE) ×2
GRASPER SUT 15 45D LOW PRO (SUTURE) IMPLANT
IV LACTATED RINGER IRRG 3000ML (IV SOLUTION) ×4
IV LR IRRIG 3000ML ARTHROMATIC (IV SOLUTION) ×2 IMPLANT
KIT CANNULA 8X76-LX IN CANNULA (CANNULA) IMPLANT
MANIFOLD NEPTUNE II (INSTRUMENTS) ×4 IMPLANT
MASK FACE SPIDER DISP (MASK) ×2 IMPLANT
MAT ABSORB  FLUID 56X50 GRAY (MISCELLANEOUS) ×2
MAT ABSORB FLUID 56X50 GRAY (MISCELLANEOUS) ×1 IMPLANT
PACK ARTHROSCOPY SHOULDER (MISCELLANEOUS) ×2 IMPLANT
PAD ABD DERMACEA PRESS 5X9 (GAUZE/BANDAGES/DRESSINGS) ×4 IMPLANT
PASSER SUT FIRSTPASS SELF (INSTRUMENTS) IMPLANT
SLING ARM LRG DEEP (SOFTGOODS) ×1 IMPLANT
SLING ULTRA II LG (MISCELLANEOUS) ×2 IMPLANT
SPONGE T-LAP 18X18 ~~LOC~~+RFID (SPONGE) ×2 IMPLANT
STAPLER SKIN PROX 35W (STAPLE) ×2 IMPLANT
STRAP SAFETY 5IN WIDE (MISCELLANEOUS) ×2 IMPLANT
SUT ETHIBOND 0 MO6 C/R (SUTURE) ×2 IMPLANT
SUT ULTRABRAID 2 COBRAID 38 (SUTURE) IMPLANT
SUT VIC AB 2-0 CT1 27 (SUTURE) ×4
SUT VIC AB 2-0 CT1 TAPERPNT 27 (SUTURE) ×2 IMPLANT
TAPE MICROFOAM 4IN (TAPE) ×2 IMPLANT
TUBING CONNECTING 10 (TUBING) ×2 IMPLANT
TUBING INFLOW SET DBFLO PUMP (TUBING) ×2 IMPLANT
WAND WEREWOLF FLOW 90D (MISCELLANEOUS) ×2 IMPLANT
WATER STERILE IRR 500ML POUR (IV SOLUTION) ×2 IMPLANT

## 2021-07-12 NOTE — Discharge Instructions (Addendum)
Orthopedic discharge instructions: Keep dressing dry and intact.  May shower after dressing changed on post-op day #4 (Monday).  Cover staples with Band-Aids after drying off. Apply ice frequently to shoulder. Take ibuprofen 600-800 mg TID with meals for 7-10 days, then as necessary. Take oxycodone as prescribed when needed.  May supplement with ES Tylenol if necessary. Keep shoulder immobilizer on at all times except may remove for bathing purposes. Follow-up in 10-14 days or as scheduled.AMBULATORY SURGERY  DISCHARGE INSTRUCTIONS   The drugs that you were given will stay in your system until tomorrow so for the next 24 hours you should not:  Drive an automobile Make any legal decisions Drink any alcoholic beverage   You may resume regular meals tomorrow.  Today it is better to start with liquids and gradually work up to solid foods.  You may eat anything you prefer, but it is better to start with liquids, then soup and crackers, and gradually work up to solid foods.   Please notify your doctor immediately if you have any unusual bleeding, trouble breathing, redness and pain at the surgery site, drainage, fever, or pain not relieved by medication.    Additional Instructions: Please leave green/teal armband on for 72 hours     Please contact your physician with any problems or Same Day Surgery at [phone], Monday through Friday 6 am to 4 pm, or [place] [facility] number at [phone].      Interscalene Nerve Block with Exparel   For your surgery you have received an Interscalene Nerve Block with Exparel. Nerve Blocks affect many types of nerves, including nerves that control movement, pain and normal sensation.  You may experience feelings such as numbness, tingling, heaviness, weakness or the inability to move your arm or the feeling or sensation that your arm has "fallen asleep". A nerve block with Exparel can last up to 5 days.  Usually the weakness wears  off first.  The tingling and heaviness usually wear off next.  Finally you may start to notice pain.  Keep in mind that this may occur in any order.  Once a nerve block starts to wear off it is usually completely gone within 60 minutes. ISNB may cause mild shortness of breath, a hoarse voice, blurry vision, unequal pupils, or drooping of the face on the same side as the nerve block.  These symptoms will usually resolve with the numbness.  Very rarely the procedure itself can cause mild seizures. If needed, your surgeon will give you a prescription for pain medication.  It will take about 60 minutes for the oral pain medication to become fully effective.  So, it is recommended that you start taking this medication before the nerve block first begins to wear off, or when you first begin to feel discomfort. Take your pain medication only as prescribed.  Pain medication can cause sedation and decrease your breathing if you take more than you need for the level of pain that you have. Nausea is a common side effect of many pain medications.  You may want to eat something before taking your pain medicine to prevent nausea. After an Interscalene nerve block, you cannot feel pain, pressure or extremes in temperature in the effected arm.  Because your arm is numb it is at an increased risk for injury.  To decrease the possibility of injury, please practice the following:  While you are awake change the position of your arm frequently to prevent too much pressure on any  one area for prolonged periods of time.  If you have a cast or tight dressing, check the color or your fingers every couple of hours.  Call your surgeon with the appearance of any discoloration (white or blue). If you are given a sling to wear before you go home, please wear it  at all times until the block has completely worn off.  Do not get up at night without your sling. Please contact [facility] or your surgeon if you do not begin to regain  sensation after 7 days from the surgery.  Anesthesia may be contacted by calling the Same Day Surgery Department, Mon. through Fri., 6 am to 4 pm at [phone].   If you experience any other problems or concerns, please contact your surgeon's office. If you experience severe or prolonged shortness of breath go to the nearest emergency department.   SHOULDER SLING IMMOBILIZER   VIDEO Slingshot 2 Shoulder Brace Application - YouTube ---[email or web address]  INSTRUCTIONS While supporting the injured arm, slide the forearm into the sling. Wrap the adjustable shoulder strap around the neck and shoulders and attach the strap end to the sling using  the alligator strap tab.  Adjust the shoulder strap to the required length. Position the shoulder pad behind the neck. To secure the shoulder pad location (optional), pull the shoulder strap away from the shoulder pad, unfold the hook material on the top of the pad, then press the shoulder strap back onto the hook material to secure the pad in place. Attach the closure strap across the open top of the sling. Position the strap so that it holds the arm securely in the sling. Next, attach the thumb strap to the open end of the sling between the thumb and fingers. After sling has been fit, it may be easily removed and reapplied using the quick release buckle on shoulder strap. If a neutral pillow or 15 abduction pillow is included, place the pillow at the waistline. Attach the sling to the pillow, lining up hook material on the pillow with the loop on sling. Adjust the waist strap to fit.  If waist strap is too long, cut it to fit. Use the small piece of double sided hook material (located on top of the pillow) to secure the strap end. Place the double sided hook material on the inside of the cut strap end and secure it to the waist strap.     If no pillow is included, attach the waist strap to the sling and adjust to fit.    Washing  Instructions: Straps and sling must be removed and cleaned regularly depending on your activity level and perspiration. Hand wash straps and sling in cold water with mild detergent, rinse, air dry

## 2021-07-12 NOTE — Anesthesia Preprocedure Evaluation (Addendum)
Anesthesia Evaluation  Patient identified by MRN, date of birth, ID band Patient awake    Reviewed: Allergy & Precautions, NPO status , Patient's Chart, lab work & pertinent test results, reviewed documented beta blocker date and time   Airway Mallampati: II  TM Distance: >3 FB Neck ROM: full    Dental no notable dental hx. (+) Poor Dentition   Pulmonary neg pulmonary ROS, former smoker,    Pulmonary exam normal        Cardiovascular hypertension, Pt. on medications and Pt. on home beta blockers Normal cardiovascular exam     Neuro/Psych S/p ANTERIOR CERVICAL DECOMP/DISCECTOMY FUSION 2019  Restless leg syndrome  Neuromuscular disease (Carpal tunnel syndrome) negative psych ROS   GI/Hepatic negative GI ROS, Neg liver ROS,   Endo/Other  negative endocrine ROS  Renal/GU      Musculoskeletal  (+) Arthritis , Nontraumatic incomplete tear of left rotator cuff   Abdominal   Peds  Hematology negative hematology ROS (+)   Anesthesia Other Findings Backache, unspecified    COVID    ED (erectile dysfunction)    History of kidney stones 1995 "passed them" (04/08/2016)  Hyperlipidemia  controlled medically  Unspecified essential hypertension  controlled Unspecified personal history presenting hazards to health       Reproductive/Obstetrics negative OB ROS                            Anesthesia Physical Anesthesia Plan  ASA: 2  Anesthesia Plan: General ETT   Post-op Pain Management: Regional block   Induction: Intravenous  PONV Risk Score and Plan: 2 and Ondansetron, Dexamethasone, Midazolam and Treatment may vary due to age or medical condition  Airway Management Planned: Oral ETT  Additional Equipment:   Intra-op Plan:   Post-operative Plan:   Informed Consent: I have reviewed the patients History and Physical, chart, labs and discussed the procedure including the risks, benefits  and alternatives for the proposed anesthesia with the patient or authorized representative who has indicated his/her understanding and acceptance.       Plan Discussed with: Anesthesiologist, CRNA and Surgeon  Anesthesia Plan Comments:        Anesthesia Quick Evaluation

## 2021-07-12 NOTE — Anesthesia Procedure Notes (Signed)
Anesthesia Regional Block: Interscalene brachial plexus block   Pre-Anesthetic Checklist: , timeout performed,  Correct Patient, Correct Site, Correct Laterality,  Correct Procedure, Correct Position, site marked,  Risks and benefits discussed,  Surgical consent,  Pre-op evaluation,  At surgeon's request and post-op pain management  Laterality: Upper and Left  Prep: chloraprep       Needles:  Injection technique: Single-shot  Needle Type: Stimiplex     Needle Length: 9cm  Needle Gauge: 22   Needle insertion depth: 3 cm   Additional Needles:   Procedures:, nerve stimulator,,, ultrasound used (permanent image in chart),,     Nerve Stimulator or Paresthesia:  Response: L Forearm, 0.6 mA, 2 ms, 3 cm  Additional Responses:   Narrative:  Start time: 07/12/2021 2:50 PM End time: 07/12/2021 2:56 PM Injection made incrementally with aspirations every 5 mL.  Performed by: Personally  Anesthesiologist: Phill Mutter, MD  Additional Notes: Patient consented for risk and benefits of nerve block including but not limited to nerve damage, failed block, bleeding and infection.  Patient voiced understanding.  Functioning IV was confirmed and monitors were applied.  Timeout done prior to procedure and prior to any sedation being given to the patient.  Patient confirmed procedure site prior to any sedation given to the patient.  A 79mm 22ga Stimuplex needle was used. Sterile prep,hand hygiene and sterile gloves were used.  Minimal sedation (Versed 2mg  + Fent 154mcg) used for procedure.  No paresthesia endorsed by patient during the procedure.  Negative aspiration and negative test dose prior to incremental administration of local anesthetic. The patient tolerated the procedure well with no immediate complications.  Injectate:  0.5% Bupivacaine 33ml + Exparel 61ml

## 2021-07-12 NOTE — H&P (Signed)
History of Present Illness: Devin Stanton is a 61 y.o. male who presents today for his surgical history and physical for upcoming left shoulder arthroscopy with debridement, decompression, possible rotator cuff repair and possible biceps tenodesis. The patient denies any changes in his medical history since he was last evaluated, the patient was recently prescribed Requip. The patient denies any personal history of heart attack, stroke, asthma or COPD. No history of blood clots. The patient continues report a moderate aching and throbbing discomfort in the left shoulder. Continues to have increased discomfort when trying to reach above his head or out to the side. He does have continued pain at night as well.  Past Medical History:  Hyperlipidemia   Hypertension   Past Surgical History:  VASECTOMY 2012   anterior cervical decomp/discetomy fusion 06/2015   BACK SURGERY   cardiac cath peripheral vascular cath   TONSILLECTOMY   Past Family History:  Parkinsonism Mother   Leukemia Father   Medications:  acetaminophen (TYLENOL) 500 MG tablet Take 1,000 mg by mouth as needed for Pain   amLODIPine (NORVASC) 10 MG tablet Take 1 tablet by mouth once daily   aspirin 81 MG EC tablet Take 81 mg by mouth once daily   benazepriL (LOTENSIN) 40 MG tablet Take 40 mg by mouth once daily   clobetasoL (TEMOVATE) 0.05 % cream Apply topically Apply 1 application topically 2 (two) times daily as needed (irritation   metoprolol succinate (TOPROL-XL) 50 MG XL tablet Take 1 tablet by mouth once daily. 2   rOPINIRole (REQUIP) 0.25 MG tablet Take by mouth Take 1 tablet (0.25 mg total) by mouth at bedtime.   rosuvastatin (CRESTOR) 10 MG tablet Take 10 mg by mouth once daily.   benazepril-hydrochlorthiazide (LOTENSIN HCT) 20-25 mg tablet TAKE 1 TABLET BY MOUTH ONCE DAILY (Patient not taking: Reported on 07/02/2021)   clobetasol (TEMOVATE) 0.05 % cream (Patient not taking: Reported on 07/02/2021) 3   cyclobenzaprine  (FLEXERIL) 10 MG tablet Take 10 mg by mouth 3 (three) times daily as needed (Patient not taking: Reported on 07/02/2021)   fluticasone (CUTIVATE) 0.005 % ointment Apply topically. (Patient not taking: Reported on 07/02/2021)   hydrocortisone 2.5 % lotion (Patient not taking: Reported on 07/02/2021) 0   pregabalin (LYRICA) 50 MG capsule TAKE 1 CAPSULE BY MOUTH EVERYDAY AT BEDTIME (Patient not taking: Reported on 07/02/2021) 30 capsule 2   Allergies: No Known Allergies   Review of Systems:  A comprehensive 14 point ROS was performed, reviewed by me today, and the pertinent orthopaedic findings are documented in the HPI.  Physical Exam: BP 130/84   Ht 170.2 cm (5\' 7" )   Wt 82.6 kg (182 lb 3.2 oz)   BMI 28.54 kg/m  General/Constitutional: The patient appears to be well-nourished, well-developed, and in no acute distress. Neuro/Psych: Normal mood and affect, oriented to person, place and time. Eyes: Non-icteric. Pupils are equal, round, and reactive to light, and exhibit synchronous movement. ENT: Unremarkable. Lymphatic: No palpable adenopathy. Respiratory: Lungs clear to auscultation, Normal chest excursion, No wheezes and Non-labored breathing Cardiovascular: Regular rate and rhythm. No murmurs. and No edema, swelling or tenderness, except as noted in detailed exam. Integumentary: No impressive skin lesions present, except as noted in detailed exam. Musculoskeletal: Unremarkable, except as noted in detailed exam.  Left shoulder exam: SKIN: Normal SWELLING: None WARMTH: None LYMPH NODES: No adenopathy palpable CREPITUS: None TENDERNESS: Mildly tender to palpation along lateral acromion ROM (active):      Forward flexion: 110 degrees  Abduction: 105 degree    Internal rotation: L2 (versus T12 on the right) ROM (passive):      Forward flexion: 130 degrees    Abduction: 125 degrees       ER/IR at 90 abd: 80 degrees /35 degrees   He experiences moderate pain with all motions.    STRENGTH:   Forward flexion: 4/5                         Abduction: 4-4+/5                         External rotation: 4-4+/5                         Internal rotation: 4+/5                         Pain with RC testing: Mild-moderate pain with resisted forward flexion and abduction, and mild pain with resisted external rotation   STABILITY: Normal   SPECIAL TESTS:       Luan Pulling' test: Moderately positive                                     Speed's test: Negative                                     Capsulitis - pain w/ passive ER: No                                     Crossed arm test: Negative                                     Crank: Not evaluated                                     Anterior apprehension: Negative                                     Posterior apprehension: Not evaluated   He remains neurovascularly intact to the left upper extremity.  MRI OF THE LEFT SHOULDER WITHOUT CONTRAST:  1. Mild tendinosis of the supraspinatus tendon with fraying along  the anterior bursal surface.  2. Mild tendinosis of the infraspinatus tendon.  3. Mild tendinosis of the intra-articular portion of the long head  of the biceps tendon.  4. Mild thickening of the inferior joint capsule as can be seen with  adhesive capsulitis.   Impression: Rotator cuff tendinitis, left. Nontraumatic incomplete tear of left rotator cuff. Biceps tendinitis of left upper extremity.  Plan:  1. Treatment options were discussed today with the patient. 2. The patient is scheduled for a left shoulder arthroscopy with debridement, decompression, probable rotator cuff repair and probable biceps tenodesis. Surgery scheduled with Dr. Roland Rack on 07/12/21 3. The patient was instructed on the risk and benefits of surgery and wishes to proceed at this time.  4. This document will serve as a surgical history and physical for the patient. 5. The patient will follow-up per standard postop protocol. They can call the clinic  they have any questions, new symptoms develop or symptoms worsen.  The procedure was discussed with the patient, as were the potential risks (including bleeding, infection, nerve and/or blood vessel injury, persistent or recurrent pain, failure of the repair, progression of arthritis, need for further surgery, blood clots, strokes, heart attacks and/or arhythmias, pneumonia, etc.) and benefits. The patient states his understanding and wishes to proceed.   H&P reviewed and patient re-examined. No changes.

## 2021-07-12 NOTE — Progress Notes (Signed)
done

## 2021-07-12 NOTE — Anesthesia Procedure Notes (Signed)
Procedure Name: Intubation Date/Time: 07/12/2021 3:39 PM Performed by: Johnna Acosta, CRNA Pre-anesthesia Checklist: Patient identified, Emergency Drugs available, Suction available, Patient being monitored and Timeout performed Patient Re-evaluated:Patient Re-evaluated prior to induction Oxygen Delivery Method: Circle system utilized Preoxygenation: Pre-oxygenation with 100% oxygen Induction Type: IV induction Ventilation: Mask ventilation with difficulty and Oral airway inserted - appropriate to patient size Laryngoscope Size: McGraph and 4 Grade View: Grade I Tube type: Oral Tube size: 7.5 mm Number of attempts: 1 Airway Equipment and Method: Stylet and Video-laryngoscopy Placement Confirmation: ETT inserted through vocal cords under direct vision, positive ETCO2 and breath sounds checked- equal and bilateral Secured at: 21 cm Tube secured with: Tape Dental Injury: Teeth and Oropharynx as per pre-operative assessment

## 2021-07-12 NOTE — Op Note (Signed)
07/12/2021  4:49 PM  Patient:   Devin Stanton  Pre-Op Diagnosis:   Impingement/tendinopathy with possible rotator cuff tear and biceps tendinopathy, left shoulder.  Post-Op Diagnosis:   Impingement/tendinopathy with biceps tendinopathy, left shoulder.  Procedure:   Limited arthroscopic debridement, arthroscopic subacromial decompression, and mini-open biceps tenodesis, left shoulder.  Anesthesia:   General endotracheal with interscalene block using Exparel placed preoperatively by the anesthesiologist.  Surgeon:   Pascal Lux, MD  Assistant:   Cameron Proud, PA-C; Rocco Pauls, PA-S  Findings:   As above. There was moderate degenerative labral tearing involving the anterior, superior, and posterior superior aspects of the shoulder without frank detachment from the glenoid rim. The biceps tendon demonstrated evidence of "lip sticking" without partial or full-thickness tearing. The rotator cuff was in excellent condition, as were the articular surfaces of the glenoid and humerus.  Complications:   None  Fluids:   700 cc  Estimated blood loss:   10 cc  Tourniquet time:   None  Drains:   None  Closure:   Staples      Brief clinical note:   The patient is a 61 year old male with a history of progressive worsening left shoulder pain. The patient's symptoms have progressed despite medications, activity modification, etc. The patient's history and examination are consistent with impingement/tendinopathy with a possible rotator cuff tear. These findings were also suggested by preoperative MRI imaging. The patient presents at this time for definitive management of these shoulder symptoms.  Procedure:   The patient underwent placement of an interscalene block using Exparel by the anesthesiologist in the preoperative holding area before being brought into the operating room and lain in the supine position. The patient then underwent general endotracheal intubation and anesthesia before  being repositioned in the beach chair position using the beach chair positioner. The left shoulder and upper extremity were prepped with ChloraPrep solution before being draped sterilely. Preoperative antibiotics were administered. A timeout was performed to confirm the proper surgical site before the expected portal sites and incision site were injected with 0.5% Sensorcaine with epinephrine.   A posterior portal was created and the glenohumeral joint thoroughly inspected with the findings as described above. An anterior portal was created using an outside-in technique. The labrum and rotator cuff were further probed, again confirming the above-noted findings. The areas of labral fraying were debrided back to stable margins using the full-radius resector. Areas of synovitis also were debrided back using the full-radius resector. The ArthroCare wand was inserted and used to release the biceps tendon from its labral anchor.  It also was used to obtain hemostasis as well as to "anneal" the labrum superiorly and anteriorly. The instruments were removed from the joint after suctioning the excess fluid.  The camera was repositioned through the posterior portal into the subacromial space. A separate lateral portal was created using an outside-in technique. The 3.5 mm full-radius resector was introduced and used to perform a subtotal bursectomy. The ArthroCare wand was then inserted and used to remove the periosteal tissue off the undersurface of the anterior third of the acromion as well as to recess the coracoacromial ligament from its attachment along the anterior and lateral margins of the acromion. The 4.0 mm acromionizing bur was introduced and used to complete the decompression by removing the undersurface of the anterior third of the acromion. The full radius resector was reintroduced to remove any residual bony debris before the ArthroCare wand was reintroduced to obtain hemostasis. The instruments were then  removed from the subacromial space after suctioning the excess fluid.  An approximately 4-5 cm incision was made over the anterolateral aspect of the shoulder beginning at the anterolateral corner of the acromion and extending distally in line with the bicipital groove. This incision was carried down through the subcutaneous tissues to expose the deltoid fascia. The raphae between the anterior and middle thirds was identified and this plane developed to provide access into the subacromial space. Additional bursal tissues were debrided sharply using Metzenbaum scissors. The rotator cuff was carefully inspected from its bursal surface. There was no evidence for any partial or full-thickness tearing.  The bicipital groove was identified by palpation and opened for 1-1.5 cm. The biceps tendon stump was retrieved through this defect. The floor of the bicipital groove was roughened with a curet before a a single Biomet 2.9 mm JuggerKnot anchor was inserted. Both sets of sutures were passed through the biceps tendon and tied securely to effect the tenodesis. The bicipital sheath was reapproximated using two #0 Ethibond interrupted sutures, incorporating the biceps tendon to further reinforce the tenodesis.  The wound was copiously irrigated with sterile saline solution before the deltoid raphae was reapproximated using 2-0 Vicryl interrupted sutures. The subcutaneous tissues were closed in two layers using 2-0 Vicryl interrupted sutures before the skin was closed using staples. The portal sites also were closed using staples. A sterile bulky dressing was applied to the shoulder before the arm was placed into a shoulder immobilizer. The patient was then awakened, extubated, and returned to the recovery room in satisfactory condition after tolerating the procedure well.

## 2021-07-12 NOTE — Transfer of Care (Signed)
Immediate Anesthesia Transfer of Care Note  Patient: Devin Stanton  Procedure(s) Performed: Limited arthroscopic debridement, arthroscopic subacromial decompression, and mini-open biceps tenodesis, left shoulder. (Left: Shoulder)  Patient Location: PACU  Anesthesia Type:GA combined with regional for post-op pain  Level of Consciousness: awake, drowsy and patient cooperative  Airway & Oxygen Therapy: Patient Spontanous Breathing and Patient connected to face mask oxygen  Post-op Assessment: Report given to RN and Post -op Vital signs reviewed and stable  Post vital signs: Reviewed and stable  Last Vitals:  Vitals Value Taken Time  BP 133/81 07/12/21 1715  Temp 36.2 C 07/12/21 1714  Pulse 76 07/12/21 1720  Resp 12 07/12/21 1720  SpO2 100 % 07/12/21 1720  Vitals shown include unvalidated device data.  Last Pain:  Vitals:   07/12/21 1515  TempSrc:   PainSc: 0-No pain         Complications: No notable events documented.

## 2021-07-13 ENCOUNTER — Encounter: Payer: Self-pay | Admitting: Surgery

## 2021-07-13 NOTE — Anesthesia Postprocedure Evaluation (Signed)
Anesthesia Post Note  Patient: Devin Stanton  Procedure(s) Performed: Limited arthroscopic debridement, arthroscopic subacromial decompression, and mini-open biceps tenodesis, left shoulder. (Left: Shoulder)  Patient location during evaluation: PACU Anesthesia Type: General Level of consciousness: awake and alert Pain management: pain level controlled Vital Signs Assessment: post-procedure vital signs reviewed and stable Respiratory status: spontaneous breathing, nonlabored ventilation, respiratory function stable and patient connected to nasal cannula oxygen Cardiovascular status: blood pressure returned to baseline and stable Postop Assessment: no apparent nausea or vomiting Anesthetic complications: no   No notable events documented.   Last Vitals:  Vitals:   07/12/21 1802 07/12/21 1831  BP: (!) 153/92 (!) 146/89  Pulse: 82 84  Resp: 16 16  Temp: (!) 36.1 C   SpO2: 94% 94%    Last Pain:  Vitals:   07/13/21 0923  TempSrc:   PainSc: 0-No pain                 Martha Clan

## 2021-07-30 ENCOUNTER — Other Ambulatory Visit: Payer: Self-pay | Admitting: Family Medicine

## 2021-08-14 ENCOUNTER — Ambulatory Visit
Admission: RE | Admit: 2021-08-14 | Discharge: 2021-08-14 | Disposition: A | Discharge status: Home / Self Care | Payer: Medicare Other | Source: Ambulatory Visit | Attending: Unknown Physician Specialty | Admitting: Unknown Physician Specialty

## 2021-08-14 DIAGNOSIS — D37030 Neoplasm of uncertain behavior of the parotid salivary glands: Secondary | ICD-10-CM

## 2021-08-14 MED ORDER — GADOBENATE DIMEGLUMINE 529 MG/ML IV SOLN
15.0000 mL | Freq: Once | INTRAVENOUS | Status: AC | PRN
  Administered 2021-08-14: 15 mL via INTRAVENOUS

## 2021-08-31 ENCOUNTER — Other Ambulatory Visit: Payer: Self-pay | Admitting: Unknown Physician Specialty

## 2021-08-31 DIAGNOSIS — D37030 Neoplasm of uncertain behavior of the parotid salivary glands: Secondary | ICD-10-CM

## 2021-09-18 ENCOUNTER — Other Ambulatory Visit: Payer: Self-pay | Admitting: Thoracic Surgery (Cardiothoracic Vascular Surgery)

## 2021-09-18 DIAGNOSIS — R9389 Abnormal findings on diagnostic imaging of other specified body structures: Secondary | ICD-10-CM

## 2021-09-18 DIAGNOSIS — R079 Chest pain, unspecified: Secondary | ICD-10-CM

## 2021-09-18 DIAGNOSIS — J853 Abscess of mediastinum: Secondary | ICD-10-CM

## 2021-09-18 NOTE — Progress Notes (Signed)
mr

## 2021-09-21 ENCOUNTER — Ambulatory Visit
Admission: RE | Admit: 2021-09-21 | Discharge: 2021-09-21 | Disposition: A | Discharge status: Home / Self Care | Payer: Medicare Other | Source: Ambulatory Visit | Attending: Thoracic Surgery (Cardiothoracic Vascular Surgery) | Admitting: Thoracic Surgery (Cardiothoracic Vascular Surgery)

## 2021-09-21 DIAGNOSIS — R9389 Abnormal findings on diagnostic imaging of other specified body structures: Secondary | ICD-10-CM

## 2021-09-21 DIAGNOSIS — J853 Abscess of mediastinum: Secondary | ICD-10-CM

## 2021-09-21 MED ORDER — GADOBENATE DIMEGLUMINE 529 MG/ML IV SOLN
16.0000 mL | Freq: Once | INTRAVENOUS | Status: AC | PRN
  Administered 2021-09-21: 16 mL via INTRAVENOUS

## 2021-10-01 ENCOUNTER — Other Ambulatory Visit: Payer: Self-pay | Admitting: Family Medicine

## 2021-10-01 DIAGNOSIS — Z125 Encounter for screening for malignant neoplasm of prostate: Secondary | ICD-10-CM

## 2021-10-16 ENCOUNTER — Ambulatory Visit: Payer: Medicare Other | Admitting: Thoracic Surgery (Cardiothoracic Vascular Surgery)

## 2021-10-23 ENCOUNTER — Ambulatory Visit (INDEPENDENT_AMBULATORY_CARE_PROVIDER_SITE_OTHER): Payer: Medicare Other | Admitting: Thoracic Surgery (Cardiothoracic Vascular Surgery)

## 2021-10-23 VITALS — BP 139/85 | HR 85 | Resp 20 | Ht 67.0 in | Wt 179.0 lb

## 2021-10-23 DIAGNOSIS — J9859 Other diseases of mediastinum, not elsewhere classified: Secondary | ICD-10-CM

## 2021-10-23 NOTE — Progress Notes (Signed)
? ?   ?[address] ?      [place] [address] ?            [phone]   ? ?HPI: Mr. Devin Stanton returns for follow-up of an anterior mediastinal lesion ? ?Devin Stanton is a 61 year old male with history of hypertension, hyperlipidemia, nephrolithiasis, anterior cervical discectomy, and rotator cuff tear.  He was being evaluated for left arm numbness and paresthesias last fall.  A CT of the neck showed a possible anterior mediastinal nodule.  That was confirmed with a CT of the chest.  In retrospect the nodule is been present MR 3 years prior. ? ?In the interim since his last visit he has been feeling well.  He had left shoulder surgery.  He is recovering well from that. ? ? ?Past Medical History:  ?Diagnosis Date  ? Backache, unspecified   ? COVID   ? ED (erectile dysfunction)   ? History of kidney stones 1995  ? "passed them" (04/08/2016)  ? Hyperlipidemia   ? controlled medically  ? Unspecified essential hypertension   ? controlled  ? Unspecified personal history presenting hazards to health   ? ? ?Current Outpatient Medications  ?Medication Sig Dispense Refill  ? amLODipine (NORVASC) 10 MG tablet TAKE 1 TABLET BY MOUTH EVERY DAY (Patient not taking: Reported on 10/23/2021) 90 tablet 3  ? aspirin 81 MG tablet Take 81 mg by mouth daily.    ? benazepril (LOTENSIN) 40 MG tablet TAKE 1 TABLET BY MOUTH EVERY DAY 90 tablet 3  ? clobetasol cream (TEMOVATE) 7.16 % Apply 1 application topically 2 (two) times daily as needed (irritation).    ? metoprolol succinate (TOPROL-XL) 50 MG 24 hr tablet TAKE 1 TABLET BY MOUTH DAILY. TAKE WITH OR IMMEDIATELY FOLLOWING A MEAL. 90 tablet 3  ? oxyCODONE (ROXICODONE) 5 MG immediate release tablet Take 1-2 tablets (5-10 mg total) by mouth every 4 (four) hours as needed for moderate pain or severe pain. 40 tablet 0  ? rOPINIRole (REQUIP) 0.25 MG tablet Take 1 tablet (0.25 mg total) by mouth at bedtime. 30 tablet 11  ? rosuvastatin (CRESTOR) 10 MG tablet TAKE 1 TABLET BY  MOUTH EVERY DAY 90 tablet 3  ? ?No current facility-administered medications for this visit.  ? ? ?Physical Exam ?BP 139/85   Pulse 85   Resp 20   Ht '5\' 7"'$  (1.702 m)   Wt 179 lb (81.2 kg)   SpO2 95% Comment: RA  BMI 28.14 kg/m?  ?61 year old man in no acute distress ? ?Diagnostic Tests: ?MRI CHEST WITHOUT AND WITH CONTRAST ?  ?TECHNIQUE: ?Multisequence multiplanar MR examination of the chest was performed, ?both prior to and following the uncomplicated intravenous ?administration of contrast. ?  ?CONTRAST:  54m MULTIHANCE GADOBENATE DIMEGLUMINE 529 MG/ML IV SOLN ?  ?COMPARISON:  CT chest, 04/04/2021 ?  ?FINDINGS: ?Cardiovascular: No significant vascular findings. Normal heart size. ?No pericardial effusion. ?  ?Mediastinum/Nodes: No enlarged mediastinal, hilar, or axillary lymph ?nodes. 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 ?Thyroid gland, trachea, and esophagus demonstrate no significant ?findings. ?  ?Limited lungs and pleura: Bandlike scarring of the bilateral lung ?bases. Lungs are otherwise clear, with limited assessment of the ?lung parenchyma by MR. No pleural effusion or pneumothorax. ?  ?Upper Abdomen: No acute abnormality. ?  ?Musculoskeletal: No chest wall abnormality. No suspicious osseous ?lesions identified. ?  ?IMPRESSION: ?Stable, benign, fluid signal simple thymic cyst  of the anterior ?mediastinum. No further follow-up or characterization is required. ?  ?  ?Electronically Signed ?  By: Delanna Ahmadi M.D. ?  On: 09/21/2021 18:08 ?I personally reviewed the MRI images.  There is a simple thymic cyst measuring 2 x 1.5 cm. ? ?Impression: ?Devin Stanton is a 61 year old man who was incidentally noted to have an anterior mediastinal nodule on a CT of the neck.  That nodule was present on an MRI of the thoracic spine done back in 2019.  He returns for 46-monthfollow-up.  MR shows a simple cyst with benign features.   Consistent with a thymic cyst. ? ?There is no indication for surgery or continued follow-up. ? ?Plan: ?Mr. Devin Stanton return if he has any issues with which we can be of assistance. ? ?SMelrose Nakayama MD ?[facility] ?[phone]? ? ? ? ?

## 2021-11-08 ENCOUNTER — Encounter: Payer: Self-pay | Admitting: Family Medicine

## 2021-11-12 MED ORDER — ROPINIROLE HCL 0.5 MG PO TABS: 0.2500 mg | ORAL_TABLET | Freq: Every day | ORAL | 3 refills | Status: DC

## 2021-11-15 ENCOUNTER — Telehealth: Payer: Self-pay

## 2021-11-15 NOTE — Telephone Encounter (Signed)
We received an insurance settlement form for Devin Stanton.  I made a copy and placed both in your inbox for completion.

## 2021-11-19 NOTE — Telephone Encounter (Signed)
Spoke to patient over phone.  He does not want a copy of ins forms, he will get one online from his ins company.  I have made a copy for myself and scanning.

## 2021-11-19 NOTE — Telephone Encounter (Signed)
The paperwork was also faxed to insurance company?

## 2021-11-28 MED ORDER — ROPINIROLE HCL 0.5 MG PO TABS: 0.5000 mg | ORAL_TABLET | Freq: Every day | ORAL | 3 refills | Status: DC

## 2021-11-28 NOTE — Addendum Note (Signed)
Addended by: Carter Kitten on: 11/28/2021 02:43 PM   Modules accepted: Orders

## 2021-12-01 IMAGING — MR MR SHOULDER*L* W/O CM
4 of 5 series · 31 of 40 positions shown · non-contrast
Comparison: None.

CLINICAL DATA: Left shoulder pain for 7 months. Limited range of
motion. No known injury.

EXAM:
MRI OF THE LEFT SHOULDER WITHOUT CONTRAST
TECHNIQUE: Multiplanar, multisequence MR imaging of the shoulder was performed.
No intravenous contrast was administered.

[Series 5: T2 fat-sat · axial · left · 4.0mm · 0.44mm/px · z∈[-40,+69]mm · 8 of 26 slices shown (1 of 3)]
[im 1/26]
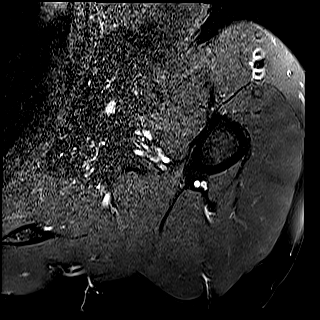
[im 4/26]
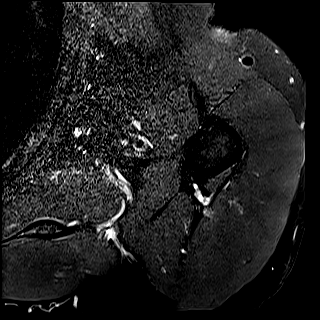
[im 8/26]
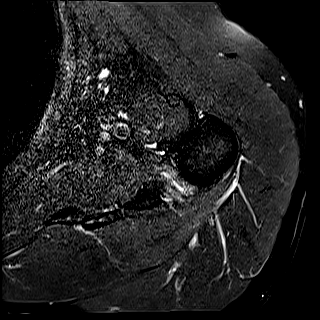
[im 11/26]
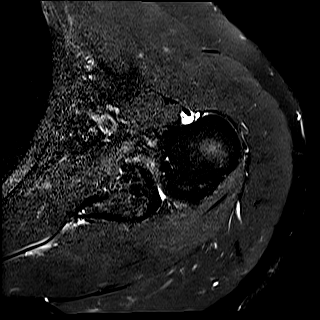
[im 15/26]
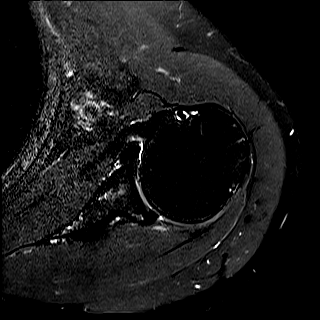
[im 18/26]
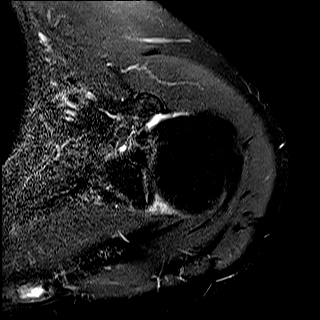
[im 22/26]
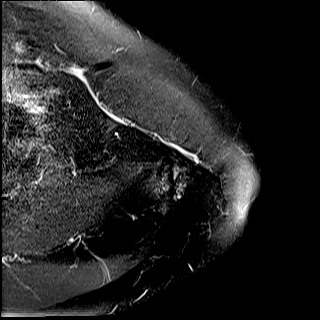
[im 26/26]
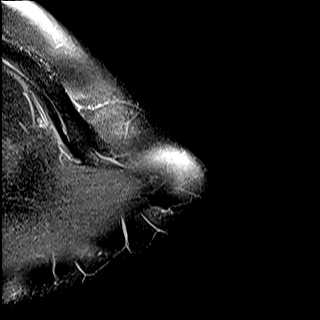

[Series 6: PD · oblique · left · 4.0mm · 0.44mm/px · 9 of 26 slices shown]
[im 1/26]
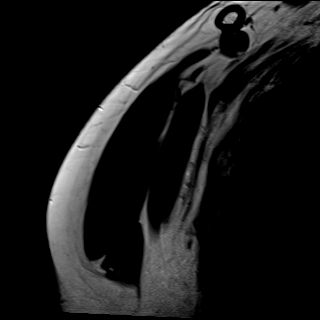
[im 4/26]
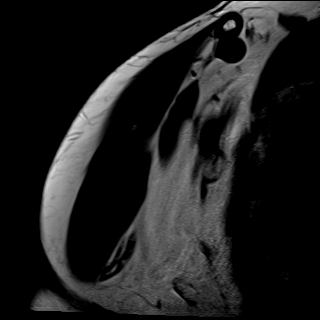
[im 7/26]
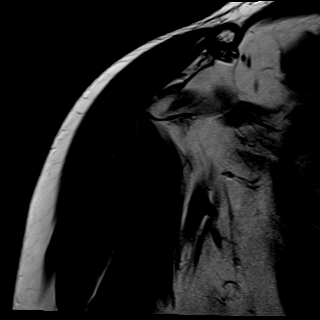
[im 10/26]
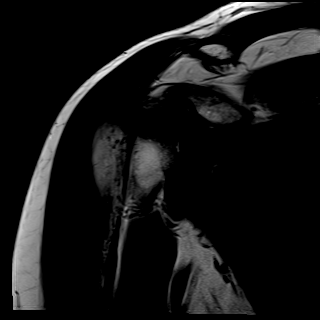
[im 13/26]
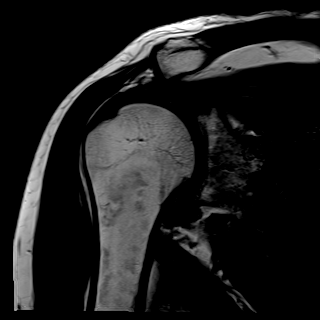
[im 16/26]
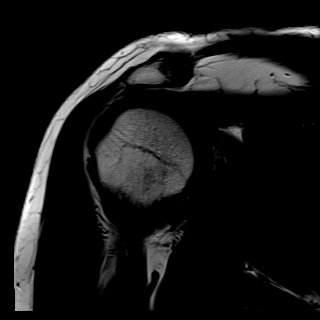
[im 19/26]
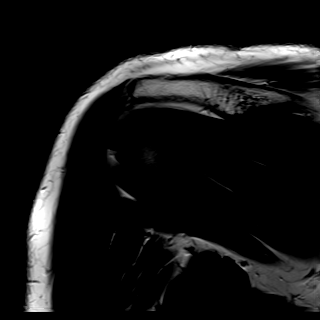
[im 22/26]
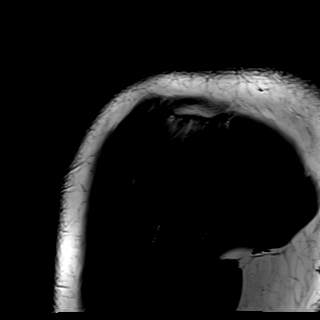
[im 26/26]
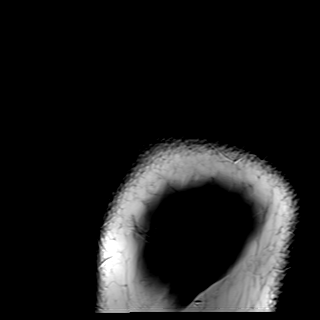

[Series 7: T2 fat-sat · oblique · left · 4.0mm · 0.44mm/px · 9 of 26 slices shown (2 of 3)]
[im 1/26]
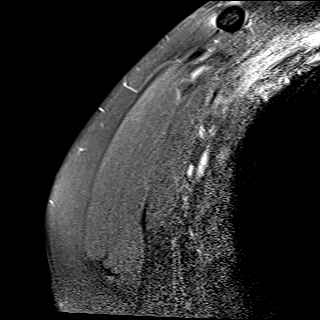
[im 4/26]
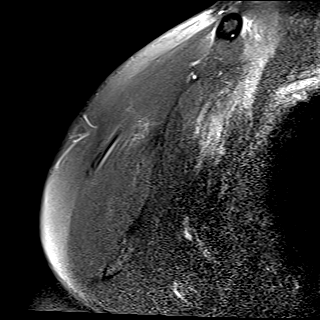
[im 7/26]
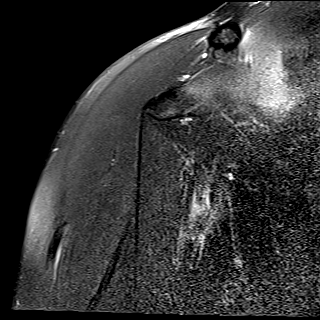
[im 10/26]
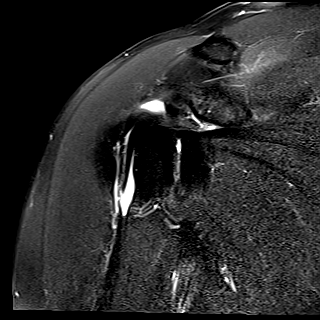
[im 13/26]
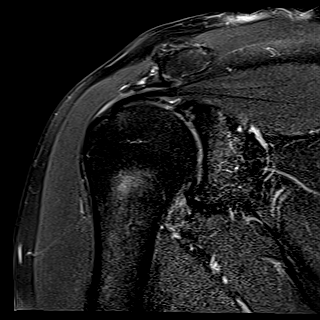
[im 16/26]
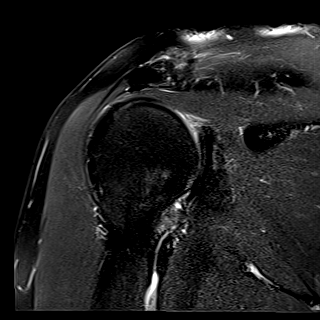
[im 19/26]
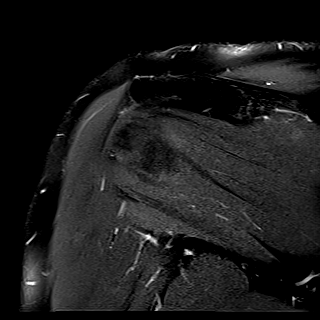
[im 22/26]
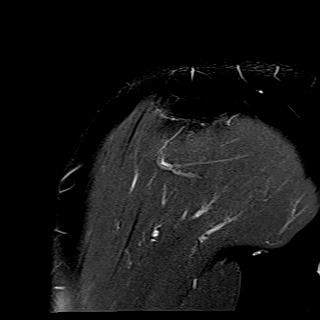
[im 26/26]
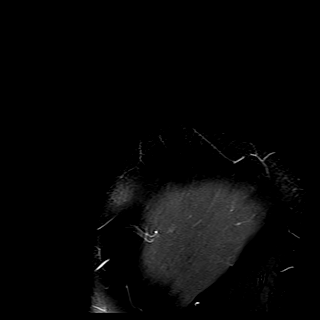

[Series 8: T2 fat-sat · oblique · left · 4.0mm · 0.22mm/px · 5 of 22 slices shown (3 of 3)]
[im 1/22]
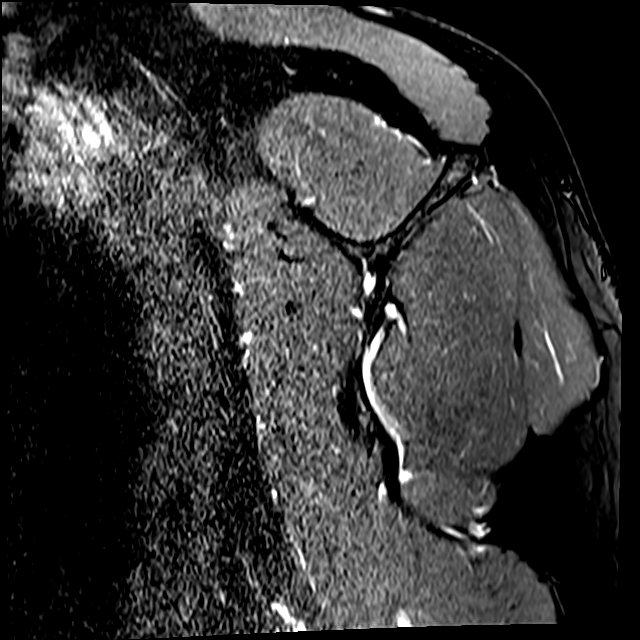
[im 4/22]
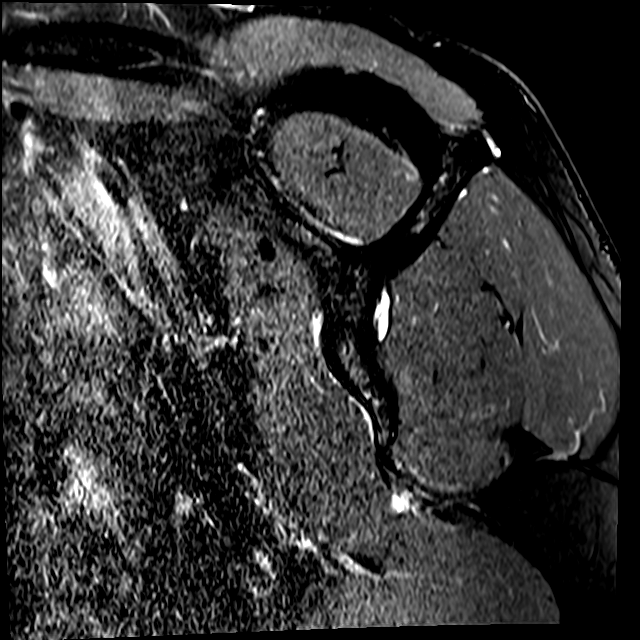
[im 8/22]
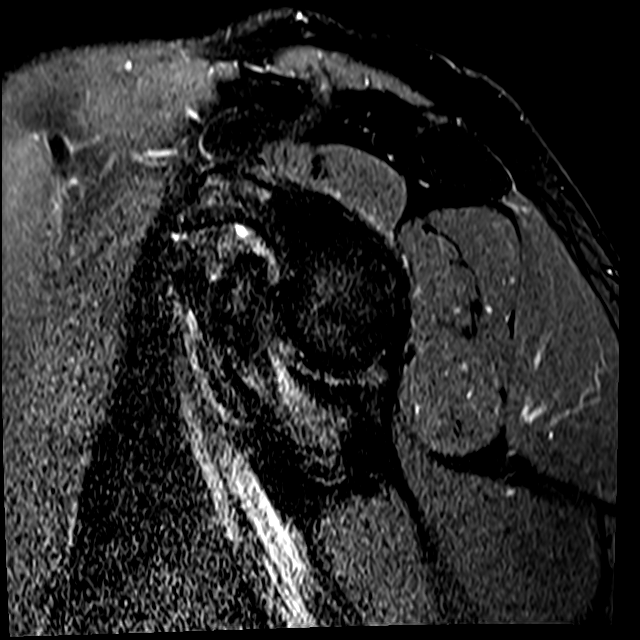
[im 11/22]
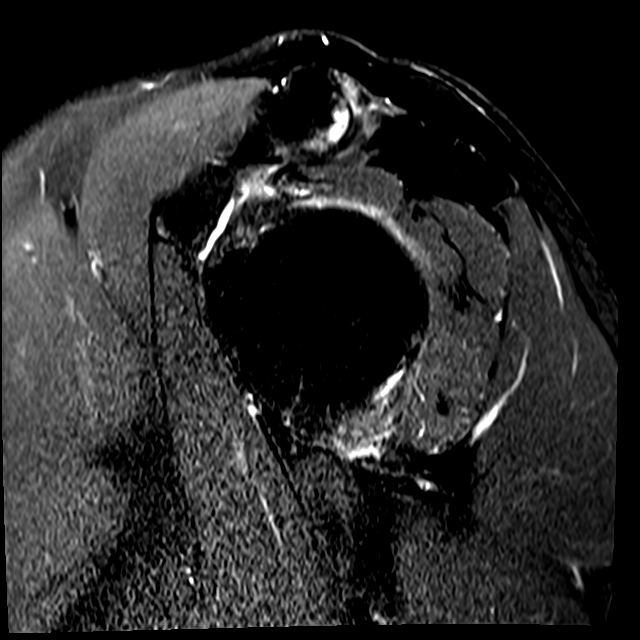
[im 18/22]
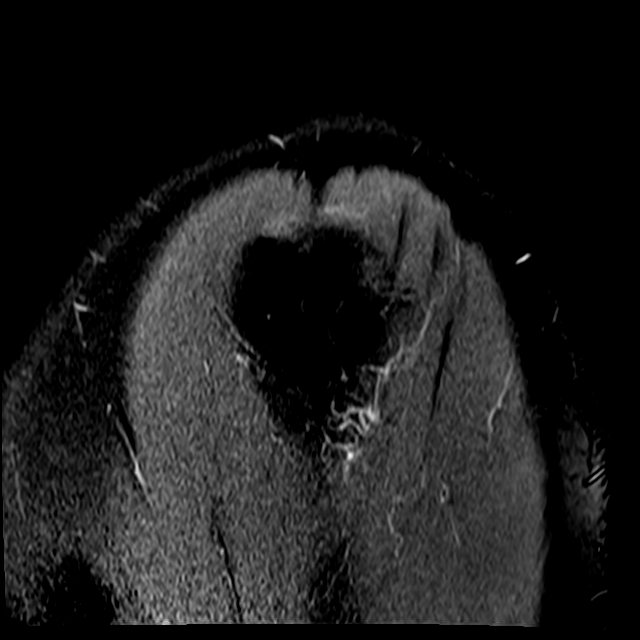

[31 of 40 positions shown; findings below may reference images not displayed]

FINDINGS: Rotator cuff: Mild tendinosis of the supraspinatus tendon with
fraying along the anterior bursal surface. Mild tendinosis of the
infraspinatus tendon. Teres minor tendon is intact. Subscapularis
tendon is intact.

Muscles: No muscle atrophy or edema. No intramuscular fluid
collection or hematoma.

Biceps Long Head: Mild tendinosis of the intra-articular portion of
the long head of the biceps tendon.

Acromioclavicular Joint: Moderate arthropathy of the
acromioclavicular joint. No subacromial/subdeltoid bursal fluid.

Glenohumeral Joint: No joint effusion. No chondral defect. Mild
thickening of the inferior joint capsule as can be seen with
adhesive capsulitis.

Labrum: Posterosuperior labral tear.

Bones: No fracture or dislocation. No aggressive osseous lesion.

Other: No fluid collection or hematoma.
IMPRESSION: 1. Mild tendinosis of the supraspinatus tendon with fraying along
the anterior bursal surface.
2. Mild tendinosis of the infraspinatus tendon.
3. Mild tendinosis of the intra-articular portion of the long head
of the biceps tendon.
4. Mild thickening of the inferior joint capsule as can be seen with
adhesive capsulitis.

## 2022-02-21 ENCOUNTER — Ambulatory Visit
Admission: RE | Admit: 2022-02-21 | Discharge: 2022-02-21 | Disposition: A | Discharge status: Home / Self Care | Payer: Medicare Other | Source: Ambulatory Visit | Attending: Unknown Physician Specialty | Admitting: Unknown Physician Specialty

## 2022-02-21 DIAGNOSIS — D37030 Neoplasm of uncertain behavior of the parotid salivary glands: Secondary | ICD-10-CM

## 2022-02-21 MED ORDER — GADOBENATE DIMEGLUMINE 529 MG/ML IV SOLN
17.0000 mL | Freq: Once | INTRAVENOUS | Status: AC | PRN
  Administered 2022-02-21: 17 mL via INTRAVENOUS

## 2022-02-26 ENCOUNTER — Other Ambulatory Visit: Payer: Self-pay | Admitting: Unknown Physician Specialty

## 2022-02-26 DIAGNOSIS — D37030 Neoplasm of uncertain behavior of the parotid salivary glands: Secondary | ICD-10-CM

## 2022-06-11 ENCOUNTER — Telehealth: Payer: Self-pay | Admitting: Family Medicine

## 2022-06-11 DIAGNOSIS — R7303 Prediabetes: Secondary | ICD-10-CM

## 2022-06-11 DIAGNOSIS — Z13220 Encounter for screening for lipoid disorders: Secondary | ICD-10-CM

## 2022-06-11 DIAGNOSIS — E785 Hyperlipidemia, unspecified: Secondary | ICD-10-CM

## 2022-06-11 DIAGNOSIS — Z125 Encounter for screening for malignant neoplasm of prostate: Secondary | ICD-10-CM

## 2022-06-11 NOTE — Telephone Encounter (Signed)
-----   Message from Velna Hatchet, RT sent at 06/04/2022  3:43 PM EST ----- Regarding: Thu 1/11 lab Patient is scheduled for cpx, please order future labs.  Thanks, Anda Kraft

## 2022-06-20 ENCOUNTER — Other Ambulatory Visit (INDEPENDENT_AMBULATORY_CARE_PROVIDER_SITE_OTHER): Payer: Medicare Other

## 2022-06-20 DIAGNOSIS — Z13220 Encounter for screening for lipoid disorders: Secondary | ICD-10-CM

## 2022-06-20 DIAGNOSIS — Z125 Encounter for screening for malignant neoplasm of prostate: Secondary | ICD-10-CM

## 2022-06-20 DIAGNOSIS — R7303 Prediabetes: Secondary | ICD-10-CM

## 2022-06-20 LAB — COMPREHENSIVE METABOLIC PANEL
ALT: 39 U/L (ref 0–53)
AST: 29 U/L (ref 0–37)
Albumin: 4.7 g/dL (ref 3.5–5.2)
Alkaline Phosphatase: 73 U/L (ref 39–117)
BUN: 18 mg/dL (ref 6–23)
CO2: 28 mEq/L (ref 19–32)
Calcium: 9.8 mg/dL (ref 8.4–10.5)
Chloride: 100 mEq/L (ref 96–112)
Creatinine, Ser: 1.22 mg/dL (ref 0.40–1.50)
GFR: 63.73 mL/min (ref >60.00)
Glucose, Bld: 92 mg/dL (ref 70–99)
Potassium: 4.4 mEq/L (ref 3.5–5.1)
Sodium: 138 mEq/L (ref 135–145)
Total Bilirubin: 0.5 mg/dL (ref 0.2–1.2)
Total Protein: 7.6 g/dL (ref 6.0–8.3)

## 2022-06-20 LAB — LIPID PANEL
Cholesterol: 180 mg/dL (ref 0–200)
HDL: 87.7 mg/dL (ref >39.00)
LDL Cholesterol: 69 mg/dL (ref 0–99)
NonHDL: 92.61
Total CHOL/HDL Ratio: 2
Triglycerides: 118 mg/dL (ref 0.0–149.0)
VLDL: 23.6 mg/dL (ref 0.0–40.0)

## 2022-06-20 LAB — HEMOGLOBIN A1C: Hgb A1c MFr Bld: 6.1 % (ref 4.6–6.5)

## 2022-06-20 LAB — PSA, MEDICARE: PSA: 0.86 ng/ml (ref 0.10–4.00)

## 2022-06-20 NOTE — Progress Notes (Signed)
No critical labs need to be addressed urgently. We will discuss labs in detail at upcoming office visit.   

## 2022-06-27 ENCOUNTER — Ambulatory Visit (INDEPENDENT_AMBULATORY_CARE_PROVIDER_SITE_OTHER): Payer: Medicare Other | Admitting: Family Medicine

## 2022-06-27 ENCOUNTER — Ambulatory Visit: Payer: Medicare Other

## 2022-06-27 ENCOUNTER — Encounter: Payer: Self-pay | Admitting: Family Medicine

## 2022-06-27 ENCOUNTER — Encounter: Payer: PRIVATE HEALTH INSURANCE | Admitting: Family Medicine

## 2022-06-27 VITALS — BP 140/76 | HR 78 | Temp 98.4°F | Ht 67.5 in | Wt 177.1 lb

## 2022-06-27 DIAGNOSIS — E785 Hyperlipidemia, unspecified: Secondary | ICD-10-CM

## 2022-06-27 DIAGNOSIS — I10 Essential (primary) hypertension: Secondary | ICD-10-CM

## 2022-06-27 DIAGNOSIS — R7303 Prediabetes: Secondary | ICD-10-CM

## 2022-06-27 NOTE — Progress Notes (Signed)
Patient ID: Devin Stanton, male    DOB: [birth year], 62 y.o.   MRN: [id number]  This visit was conducted in person.  BP (!) 140/76   Pulse 78   Temp 98.4 F (36.9 C) (Oral)   Ht 5' 7.5" (1.715 m)   Wt 177 lb 2 oz (80.3 kg)   SpO2 95%   BMI 27.33 kg/m    CC:  Chief Complaint  Patient presents with   Annual Exam    Subjective:   HPI: Devin Stanton is a 62 y.o. male presenting on 06/27/2022 for Annual Exam  The patient presents for  review of chronic health problems. He also has the following acute concerns today: none  He is schedule for AMW phone visit.  Hypertension:  Borderline control in office today on amlodipine 10 mg p.o. daily, benazepril 40 mg p.o. daily, metoprolol XL 50 mg p.o. daily... he has been off benazapril in last given pharmacy issue. BP Readings from Last 3 Encounters:  06/27/22 (!) 140/76  10/23/21 139/85  07/12/21 (!) 146/89  Using medication without problems or lightheadedness:  none Chest pain with exertion: none Edema:none Short of breath: none Average home BPs:  130/70 Other issues:  Elevated Cholesterol: Family history of premature coronary artery disease.  On aspirin 81 mg p.o. daily LDL at goal less than 70 on rosuvastatin 10 mg p.o. daily. Lab Results  Component Value Date   CHOL 180 06/20/2022   HDL 87.70 06/20/2022   LDLCALC 69 06/20/2022   LDLDIRECT 167.2 04/14/2009   TRIG 118.0 06/20/2022   CHOLHDL 2 06/20/2022  Using medications without problems: none Muscle aches:   occ Diet compliance: improved, avoiding junk food Exercise: Has bow flex.. plans to restart. Other complaints:  Prediabetes  Lab Results  Component Value Date   HGBA1C 6.1 06/20/2022       Relevant past medical, surgical, family and social history reviewed and updated as indicated. Interim medical history since our last visit reviewed. Allergies and medications reviewed and updated. Outpatient Medications Prior to Visit  Medication Sig Dispense  Refill   amLODipine (NORVASC) 10 MG tablet TAKE 1 TABLET BY MOUTH EVERY DAY 90 tablet 3   aspirin 81 MG tablet Take 81 mg by mouth daily.     benazepril (LOTENSIN) 40 MG tablet TAKE 1 TABLET BY MOUTH EVERY DAY 90 tablet 3   clobetasol cream (TEMOVATE) 8.18 % Apply 1 application topically 2 (two) times daily as needed (irritation).     meloxicam (MOBIC) 15 MG tablet Take 1 tablet by mouth daily.     metoprolol succinate (TOPROL-XL) 50 MG 24 hr tablet TAKE 1 TABLET BY MOUTH DAILY. TAKE WITH OR IMMEDIATELY FOLLOWING A MEAL. 90 tablet 3   rOPINIRole (REQUIP) 0.5 MG tablet Take 1 tablet (0.5 mg total) by mouth at bedtime. 90 tablet 3   rosuvastatin (CRESTOR) 10 MG tablet TAKE 1 TABLET BY MOUTH EVERY DAY 90 tablet 3   oxyCODONE (ROXICODONE) 5 MG immediate release tablet Take 1-2 tablets (5-10 mg total) by mouth every 4 (four) hours as needed for moderate pain or severe pain. 40 tablet 0   No facility-administered medications prior to visit.     Per HPI unless specifically indicated in ROS section below Review of Systems  Constitutional:  Negative for fatigue and fever.  HENT:  Negative for ear pain.   Eyes:  Negative for pain.  Respiratory:  Negative for cough and shortness of breath.   Cardiovascular:  Negative for chest pain,  palpitations and leg swelling.  Gastrointestinal:  Negative for abdominal pain.  Genitourinary:  Negative for dysuria.  Musculoskeletal:  Negative for arthralgias.  Neurological:  Negative for syncope, light-headedness and headaches.  Psychiatric/Behavioral:  Negative for dysphoric mood.     Objective:  BP (!) 140/76   Pulse 78   Temp 98.4 F (36.9 C) (Oral)   Ht 5' 7.5" (1.715 m)   Wt 177 lb 2 oz (80.3 kg)   SpO2 95%   BMI 27.33 kg/m   Wt Readings from Last 3 Encounters:  06/27/22 177 lb 2 oz (80.3 kg)  10/23/21 179 lb (81.2 kg)  07/12/21 175 lb 0.7 oz (79.4 kg)      Physical Exam Constitutional:      Appearance: He is well-developed.  HENT:      Head: Normocephalic.     Right Ear: Hearing normal.     Left Ear: Hearing normal.     Nose: Nose normal.  Neck:     Thyroid: No thyroid mass or thyromegaly.     Vascular: No carotid bruit.     Trachea: Trachea normal.  Cardiovascular:     Rate and Rhythm: Normal rate and regular rhythm.     Pulses: Normal pulses.     Heart sounds: Heart sounds not distant. No murmur heard.    No friction rub. No gallop.     Comments: No peripheral edema Pulmonary:     Effort: Pulmonary effort is normal. No respiratory distress.     Breath sounds: Normal breath sounds.  Skin:    General: Skin is warm and dry.     Findings: No rash.  Psychiatric:        Speech: Speech normal.        Behavior: Behavior normal.        Thought Content: Thought content normal.       Results for orders placed or performed in visit on 06/20/22  PSA, Medicare  Result Value Ref Range   PSA 0.86 0.10 - 4.00 ng/ml  Comprehensive metabolic panel  Result Value Ref Range   Sodium 138 135 - 145 mEq/L   Potassium 4.4 3.5 - 5.1 mEq/L   Chloride 100 96 - 112 mEq/L   CO2 28 19 - 32 mEq/L   Glucose, Bld 92 70 - 99 mg/dL   BUN 18 6 - 23 mg/dL   Creatinine, Ser 1.22 0.40 - 1.50 mg/dL   Total Bilirubin 0.5 0.2 - 1.2 mg/dL   Alkaline Phosphatase 73 39 - 117 U/L   AST 29 0 - 37 U/L   ALT 39 0 - 53 U/L   Total Protein 7.6 6.0 - 8.3 g/dL   Albumin 4.7 3.5 - 5.2 g/dL   GFR 63.73 >60.00 mL/min   Calcium 9.8 8.4 - 10.5 mg/dL  Hemoglobin A1c  Result Value Ref Range   Hgb A1c MFr Bld 6.1 4.6 - 6.5 %  Lipid panel  Result Value Ref Range   Cholesterol 180 0 - 200 mg/dL   Triglycerides 118.0 0.0 - 149.0 mg/dL   HDL 87.70 >39.00 mg/dL   VLDL 23.6 0.0 - 40.0 mg/dL   LDL Cholesterol 69 0 - 99 mg/dL   Total CHOL/HDL Ratio 2    NonHDL 92.61     Assessment and Plan The patient's preventative maintenance and recommended screening tests for an annual wellness exam were reviewed in full today. Brought up to date unless services  declined.  Counselled on the importance of diet, exercise, and its role  in overall health and mortality. The patient's FH and SH was reviewed, including their home life, tobacco status, and drug and alcohol status.   Refused HIV. PSA... Lab Results  Component Value Date   PSA 0.86 06/20/2022   PSA 0.90 05/12/2020   PSA 1.21 04/23/2019  Colonoscopy.Marland Kitchen Next  was due 2022 DUE NOW, Dr. Ardis Hughs.. he plans to call to set up Hep C done  Vaccine: uptodate with TDap . Recommended COVID vaccine and flu vaccine.. pt refused. Uptodate with shingrix.   Essential hypertension, benign Assessment & Plan: Stable, chronic.  Continue current medication.   Good control on amlodipine 10 mg daily, benazepril 40 mg daily, metoprolol 50 mg daily   Hyperlipidemia, unspecified hyperlipidemia type Assessment & Plan: LDL at goal on crestor 10 mg daily. Encouraged exercise, weight loss, healthy eating habits.    Prediabetes Assessment & Plan: Chronic, stable control.  Encouraged low-carb diet, regular exercise and weight loss.      Return in about 1 year (around 06/28/2023) for phone AMW,  fasting labs then 48mn CPE with me.      AEliezer Lofts MD

## 2022-06-27 NOTE — Assessment & Plan Note (Signed)
LDL at goal on crestor 10 mg daily. Encouraged exercise, weight loss, healthy eating habits.

## 2022-06-27 NOTE — Patient Instructions (Signed)
Call to set up colonoscopy: [facility]  [phone]

## 2022-06-27 NOTE — Assessment & Plan Note (Signed)
Stable, chronic.  Continue current medication.   Good control on amlodipine 10 mg daily, benazepril 40 mg daily, metoprolol 50 mg daily

## 2022-06-27 NOTE — Assessment & Plan Note (Signed)
Chronic, stable control.  Encouraged low-carb diet, regular exercise and weight loss.

## 2022-07-08 ENCOUNTER — Other Ambulatory Visit: Payer: Self-pay | Admitting: Family Medicine

## 2022-07-10 ENCOUNTER — Ambulatory Visit (INDEPENDENT_AMBULATORY_CARE_PROVIDER_SITE_OTHER): Payer: Medicare Other

## 2022-07-10 VITALS — Ht 67.5 in | Wt 177.0 lb

## 2022-07-10 DIAGNOSIS — Z0000 Encounter for general adult medical examination without abnormal findings: Secondary | ICD-10-CM | POA: Diagnosis not present

## 2022-07-10 NOTE — Progress Notes (Signed)
Virtual Visit via Telephone Note  I connected with  Devin Stanton on 07/10/22 at  8:45 AM EST by telephone and verified that I am speaking with the correct person using two identifiers.  Location: Patient: home Provider: [place] Persons participating in the virtual visit: [place]   I discussed the limitations, risks, security and privacy concerns of performing an evaluation and management service by telephone and the availability of in person appointments. The patient expressed understanding and agreed to proceed.  Interactive audio and video telecommunications were attempted between this nurse and patient, however failed, due to patient having technical difficulties OR patient did not have access to video capability.  We continued and completed visit with audio only.  Some vital signs may be absent or patient reported.   Dionisio Bacilio, LPN  Subjective:   Devin Stanton is a 62 y.o. male who presents for Medicare Annual/Subsequent preventive examination.  Review of Systems     Cardiac Risk Factors include: advanced age (>52mn, >>26women);hypertension;male gender     Objective:    There were no vitals filed for this visit. There is no height or weight on file to calculate BMI.     07/10/2022    8:50 AM 07/12/2021    2:25 PM 07/03/2021    8:24 AM 01/02/2020   11:17 AM 04/08/2016    4:40 PM 04/08/2016   11:27 AM  Advanced Directives  Does Patient Have a Medical Advance Directive? No No No No No No  Would patient like information on creating a medical advance directive? No - Patient declined No - Patient declined No - Patient declined No - Patient declined No - patient declined information     Current Medications (verified) Outpatient Encounter Medications as of 07/10/2022  Medication Sig   amLODipine (NORVASC) 10 MG tablet TAKE 1 TABLET BY MOUTH EVERY DAY   aspirin 81 MG tablet Take 81 mg by mouth daily.   benazepril (LOTENSIN) 40 MG  tablet TAKE 1 TABLET BY MOUTH EVERY DAY   clobetasol cream (TEMOVATE) 02.44% Apply 1 application topically 2 (two) times daily as needed (irritation).   meloxicam (MOBIC) 15 MG tablet Take 1 tablet by mouth daily.   metoprolol succinate (TOPROL-XL) 50 MG 24 hr tablet TAKE 1 TABLET BY MOUTH EVERY DAY WITH OR IMMEDIATELY FOLLOWING A MEAL   rOPINIRole (REQUIP) 0.5 MG tablet Take 1 tablet (0.5 mg total) by mouth at bedtime.   rosuvastatin (CRESTOR) 10 MG tablet TAKE 1 TABLET BY MOUTH EVERY DAY   No facility-administered encounter medications on file as of 07/10/2022.    Allergies (verified) Patient has no known allergies.   History: Past Medical History:  Diagnosis Date   Backache, unspecified    COVID    ED (erectile dysfunction)    History of kidney stones 1995   "passed them" (04/08/2016)   Hyperlipidemia    controlled medically   Unspecified essential hypertension    controlled   Unspecified personal history presenting hazards to health    Past Surgical History:  Procedure Laterality Date   ANTERIOR CERVICAL DECOMP/DISCECTOMY FUSION  06/2015   "C3-4-5"   BACK SURGERY     CARDIAC CATHETERIZATION N/A 04/09/2016   Procedure: Left Heart Cath and Coronary Angiography;  Surgeon: DLeonie Man MD;  [facility];  Service: Cardiovascular;  Laterality: N/A;   CYST EXCISION Left ~ 2013   "mucoid cyst; middle finger"   PERIPHERAL VASCULAR CATHETERIZATION N/A 04/09/2016  Procedure: Abdominal Aortogram;  Surgeon: Leonie Man, MD;  Location: [facility];  Service: Cardiovascular;  Laterality: N/A;   SHOULDER ARTHROSCOPY WITH BICEPS TENDON REPAIR Left 07/12/2021   Procedure: Limited arthroscopic debridement, arthroscopic subacromial decompression, and mini-open biceps tenodesis, left shoulder.;  Surgeon: Corky Mull, MD;  Location: [facility];  Service: Orthopedics;  Laterality: Left;   TONSILLECTOMY     VASECTOMY  ~ 2012   Family History  Problem Relation Age  of Onset   Parkinsonism Mother    Macular degeneration Mother    Leukemia Father    Coronary artery disease Father    Heart disease Brother 21       CABG x 5   Heart disease Brother    Stroke Brother 31   Colon cancer Neg Hx    Esophageal cancer Neg Hx    Stomach cancer Neg Hx    Social History   Socioeconomic History   Marital status: Married    Spouse name: Not on file   Number of children: 3   Years of education: 16   Highest education level: Not on file  Occupational History   Occupation: Administrator, Civil Service: [employer]  Tobacco Use   Smoking status: Former    Types: Cigars   Smokeless tobacco: Current    Types: Snuff  Vaping Use   Vaping Use: Never used  Substance and Sexual Activity   Alcohol use: Yes    Alcohol/week: 8.0 standard drinks of alcohol    Types: 8 Glasses of wine per week   Drug use: No   Sexual activity: Yes    Partners: Female  Other Topics Concern   Not on file  Social History Narrative   [employer] / 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 2nd marriage- '06-08, 3rd marriage 12/10   1 son- '78,step son '91, step daughter '93.    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   Social Determinants of Health   Financial Resource Strain: Low Risk  (07/10/2022)   Overall Financial Resource Strain (CARDIA)    Difficulty of Paying Living Expenses: Not hard at all  Food Insecurity: No Food Insecurity (07/10/2022)   Hunger Vital Sign    Worried About Running Out of Food in the Last Year: Never true    Ran Out of Food in the Last Year: Never true  Transportation Needs: No Transportation Needs (07/10/2022)   PRAPARE - Hydrologist (Medical): No    Lack of Transportation (Non-Medical): No  Physical Activity: Insufficiently Active (07/10/2022)   Exercise Vital Sign    Days of Exercise per Week: 2 days    Minutes of Exercise per Session: 30 min  Stress: No Stress  Concern Present (07/10/2022)   [place]    Feeling of Stress : Not at all  Social Connections: Moderately Isolated (07/10/2022)   Social Connection and Isolation Panel [NHANES]    Frequency of Communication with Friends and Family: More than three times a week    Frequency of Social Gatherings with Friends and Family: More than three times a week    Attends Religious Services: More than 4 times per year    Active Member of [employer] or Organizations: No    Attends Archivist Meetings: Never    Marital Status: Widowed    Tobacco Counseling Ready to quit:  Not Answered Counseling given: Not Answered   Clinical Intake:  Pre-visit preparation completed: Yes  Pain : No/denies pain     Nutritional Risks: None Diabetes: No  How often do you need to have someone help you when you read instructions, pamphlets, or other written materials from your doctor or pharmacy?: 1 - Never  Diabetic?no  Interpreter Needed?: No  Information entered by :: Kirke Shaggy, LPN   Activities of Daily Living    07/10/2022    8:51 AM  In your present state of health, do you have any difficulty performing the following activities:  Hearing? 0  Vision? 0  Difficulty concentrating or making decisions? 0  Walking or climbing stairs? 0  Dressing or bathing? 0  Doing errands, shopping? 0  Preparing Food and eating ? N  Using the Toilet? N  In the past six months, have you accidently leaked urine? N  Do you have problems with loss of bowel control? N  Managing your Medications? N  Managing your Finances? N  Housekeeping or managing your Housekeeping? N    Patient Care Team: Jinny Sanders, MD as PCP - General (Family Medicine)  Indicate any recent Medical Services you may have received from other than [facility] providers in the past year (date may be approximate).     Assessment:   This is a routine wellness examination for  Devin Stanton.  Hearing/Vision screen Hearing Screening - Comments:: Wears aids Vision Screening - Comments:: Wears glasses- Dr.Scott  Dietary issues and exercise activities discussed: Current Exercise Habits: Home exercise routine, Type of exercise: walking, Time (Minutes): 30, Frequency (Times/Week): 2, Weekly Exercise (Minutes/Week): 60, Intensity: Mild   Goals Addressed             This Visit's Progress    DIET - EAT MORE FRUITS AND VEGETABLES         Depression Screen    07/10/2022    8:49 AM 06/27/2022   10:40 AM 06/26/2021    2:53 PM 05/23/2020   12:32 PM 04/23/2019   12:14 PM 04/14/2018    9:56 AM  PHQ 2/9 Scores  PHQ - 2 Score 0 0 0 0 0 0  PHQ- 9 Score 0         Fall Risk    07/10/2022    8:51 AM 06/27/2022   10:40 AM 06/26/2021    3:45 PM 05/23/2020   12:32 PM  [place] in the past year? 0 0 0 0  Number falls in past yr: 0 0    Injury with Fall? 0 0    Risk for fall due to : No Fall Risks No Fall Risks    Follow up Falls prevention discussed;Falls evaluation completed Falls evaluation completed  Falls evaluation completed    FALL RISK PREVENTION PERTAINING TO THE HOME:  Any stairs in or around the home? Yes  If so, are there any without handrails? No  Home free of loose throw rugs in walkways, pet beds, electrical cords, etc? Yes  Adequate lighting in your home to reduce risk of falls? Yes   ASSISTIVE DEVICES UTILIZED TO PREVENT FALLS:  Life alert? No  Use of a cane, walker or w/c? No  Grab bars in the bathroom? No  Shower chair or bench in shower? Yes  Elevated toilet seat or a handicapped toilet? No   Cognitive Function:        07/10/2022    8:55 AM  6CIT Screen  What Year? 0 points  What month? 0 points  What time? 0 points  Count back from 20 0 points  Months in reverse 0 points  Repeat phrase 0 points  Total Score 0 points    Immunizations Immunization History  Administered Date(s) Administered   Influenza Split 03/02/2012    Influenza Whole 03/11/2008, 04/18/2009, 04/23/2010   Influenza,inj,Quad PF,6+ Mos 05/31/2014, 03/22/2016, 03/26/2017, 04/14/2018, 05/23/2020   Influenza-Unspecified 03/07/2015, 03/25/2016   Td 11/14/2006   Tdap 05/22/2017   Zoster Recombinat (Shingrix) 05/23/2020, 09/15/2020    TDAP status: Up to date  Flu Vaccine status: Declined, Education has been provided regarding the importance of this vaccine but patient still declined. Advised may receive this vaccine at local pharmacy or Health Dept. Aware to provide a copy of the vaccination record if obtained from local pharmacy or Health Dept. Verbalized acceptance and understanding.  Pneumococcal vaccine status: Declined,  Education has been provided regarding the importance of this vaccine but patient still declined. Advised may receive this vaccine at local pharmacy or Health Dept. Aware to provide a copy of the vaccination record if obtained from local pharmacy or Health Dept. Verbalized acceptance and understanding.   Covid-19 vaccine status: Declined, Education has been provided regarding the importance of this vaccine but patient still declined. Advised may receive this vaccine at local pharmacy or Health Dept.or vaccine clinic. Aware to provide a copy of the vaccination record if obtained from local pharmacy or Health Dept. Verbalized acceptance and understanding.  Qualifies for Shingles Vaccine? Yes   Zostavax completed No   Shingrix Completed?: Yes  Screening Tests Health Maintenance  Topic Date Due   COLONOSCOPY (Pts 45-34yr Insurance coverage will need to be confirmed)  05/07/2021   COVID-19 Vaccine (1) 07/13/2022 (Originally 11/16/1961)   INFLUENZA VACCINE  09/08/2022 (Originally 01/08/2022)   HIV Screening  06/28/2023 (Originally 05/19/1976)   Medicare Annual Wellness (AWV)  07/11/2023   DTaP/Tdap/Td (3 - Td or Tdap) 05/23/2027   Hepatitis C Screening  Completed   Zoster Vaccines- Shingrix  Completed   HPV VACCINES  Aged Out     Health Maintenance  Health Maintenance Due  Topic Date Due   COLONOSCOPY (Pts 45-452yrInsurance coverage will need to be confirmed)  05/07/2021    Colorectal cancer screening: Referral to GI placed 07/10/22. Pt aware the office will call re: appt.- pt wants to schedule himself, no referral placed  Lung Cancer Screening: (Low Dose CT Chest recommended if Age 63-80ears, 30 pack-year currently smoking OR have quit w/in 15years.) does not qualify.   Additional Screening:  Hepatitis C Screening: does qualify; Completed 09/13/14  Vision Screening: Recommended annual ophthalmology exams for early detection of glaucoma and other disorders of the eye. Is the patient up to date with their annual eye exam?  Yes  Who is the provider or what is the name of the office in which the patient attends annual eye exams? Dr.Scott If pt is not established with a provider, would they like to be referred to a provider to establish care? No .   Dental Screening: Recommended annual dental exams for proper oral hygiene  Community Resource Referral / Chronic Care Management: CRR required this visit?  No   CCM required this visit?  No      Plan:     I have personally reviewed and noted the following in the patient's chart:   Medical and social history Use of alcohol, tobacco or illicit drugs  Current medications and supplements including opioid prescriptions. Patient is not currently taking  opioid prescriptions. Functional ability and status Nutritional status Physical activity Advanced directives List of other physicians Hospitalizations, surgeries, and ER visits in previous 12 months Vitals Screenings to include cognitive, depression, and falls Referrals and appointments  In addition, I have reviewed and discussed with patient certain preventive protocols, quality metrics, and best practice recommendations. A written personalized care plan for preventive services as well as general  preventive health recommendations were provided to patient.     Dionisio Zachry, LPN   10/13/3974   Nurse Notes: none

## 2022-07-10 NOTE — Patient Instructions (Signed)
Devin Stanton , Thank you for taking time to come for your Medicare Wellness Visit. I appreciate your ongoing commitment to your health goals. Please review the following plan we discussed and let me know if I can assist you in the future.   These are the goals we discussed:  Goals      DIET - EAT MORE FRUITS AND VEGETABLES        This is a list of the screening recommended for you and due dates:  Health Maintenance  Topic Date Due   Colon Cancer Screening  05/07/2021   COVID-19 Vaccine (1) 07/13/2022*   Flu Shot  09/08/2022*   HIV Screening  06/28/2023*   Medicare Annual Wellness Visit  07/11/2023   DTaP/Tdap/Td vaccine (3 - Td or Tdap) 05/23/2027   Hepatitis C Screening: USPSTF Recommendation to screen - Ages 49-79 yo.  Completed   Zoster (Shingles) Vaccine  Completed   HPV Vaccine  Aged Out  *Topic was postponed. The date shown is not the original due date.    Advanced directives: no  Conditions/risks identified: none  Next appointment: Follow up in one year for your annual wellness visit 07/14/23 @ 8 am by phone  Preventive Care 40-64 Years, Male Preventive care refers to lifestyle choices and visits with your health care provider that can promote health and wellness. What does preventive care include? A yearly physical exam. This is also called an annual well check. Dental exams once or twice a year. Routine eye exams. Ask your health care provider how often you should have your eyes checked. Personal lifestyle choices, including: Daily care of your teeth and gums. Regular physical activity. Eating a healthy diet. Avoiding tobacco and drug use. Limiting alcohol use. Practicing safe sex. Taking low-dose aspirin every day starting at age 44. What happens during an annual well check? The services and screenings done by your health care provider during your annual well check will depend on your age, overall health, lifestyle risk factors, and family history of  disease. Counseling  Your health care provider may ask you questions about your: Alcohol use. Tobacco use. Drug use. Emotional well-being. Home and relationship well-being. Sexual activity. Eating habits. Work and work Statistician. Screening  You may have the following tests or measurements: Height, weight, and BMI. Blood pressure. Lipid and cholesterol levels. These may be checked every 5 years, or more frequently if you are over 62 years old. Skin check. Lung cancer screening. You may have this screening every year starting at age 65 if you have a 30-pack-year history of smoking and currently smoke or have quit within the past 15 years. Fecal occult blood test (FOBT) of the stool. You may have this test every year starting at age 46. Flexible sigmoidoscopy or colonoscopy. You may have a sigmoidoscopy every 5 years or a colonoscopy every 10 years starting at age 48. Prostate cancer screening. Recommendations will vary depending on your family history and other risks. Hepatitis C blood test. Hepatitis B blood test. Sexually transmitted disease (STD) testing. Diabetes screening. This is done by checking your blood sugar (glucose) after you have not eaten for a while (fasting). You may have this done every 1-3 years. Discuss your test results, treatment options, and if necessary, the need for more tests with your health care provider. Vaccines  Your health care provider may recommend certain vaccines, such as: Influenza vaccine. This is recommended every year. Tetanus, diphtheria, and acellular pertussis (Tdap, Td) vaccine. You may need a Td booster every  10 years. Zoster vaccine. You may need this after age 59. Pneumococcal 13-valent conjugate (PCV13) vaccine. You may need this if you have certain conditions and have not been vaccinated. Pneumococcal polysaccharide (PPSV23) vaccine. You may need one or two doses if you smoke cigarettes or if you have certain conditions. Talk to your  health care provider about which screenings and vaccines you need and how often you need them. This information is not intended to replace advice given to you by your health care provider. Make sure you discuss any questions you have with your health care provider. Document Released: 06/23/2015 Document Revised: 02/14/2016 Document Reviewed: 03/28/2015 Elsevier Interactive Patient Education  2017 [place] Prevention in the Home Falls can cause injuries. They can happen to people of all ages. There are many things you can do to make your home safe and to help prevent falls. What can I do on the outside of my home? Regularly fix the edges of walkways and driveways and fix any cracks. Remove anything that might make you trip as you walk through a door, such as a raised step or threshold. Trim any bushes or trees on the path to your home. Use bright outdoor lighting. Clear any walking paths of anything that might make someone trip, such as rocks or tools. Regularly check to see if handrails are loose or broken. Make sure that both sides of any steps have handrails. Any raised decks and porches should have guardrails on the edges. Have any leaves, snow, or ice cleared regularly. Use sand or salt on walking paths during winter. Clean up any spills in your garage right away. This includes oil or grease spills. What can I do in the bathroom? Use night lights. Install grab bars by the toilet and in the tub and shower. Do not use towel bars as grab bars. Use non-skid mats or decals in the tub or shower. If you need to sit down in the shower, use a plastic, non-slip stool. Keep the floor dry. Clean up any water that spills on the floor as soon as it happens. Remove soap buildup in the tub or shower regularly. Attach bath mats securely with double-sided non-slip rug tape. Do not have throw rugs and other things on the floor that can make you trip. What can I do in the bedroom? Use night  lights. Make sure that you have a light by your bed that is easy to reach. Do not use any sheets or blankets that are too big for your bed. They should not hang down onto the floor. Have a firm chair that has side arms. You can use this for support while you get dressed. Do not have throw rugs and other things on the floor that can make you trip. What can I do in the kitchen? Clean up any spills right away. Avoid walking on wet floors. Keep items that you use a lot in easy-to-reach places. If you need to reach something above you, use a strong step stool that has a grab bar. Keep electrical cords out of the way. Do not use floor polish or wax that makes floors slippery. If you must use wax, use non-skid floor wax. Do not have throw rugs and other things on the floor that can make you trip. What can I do with my stairs? Do not leave any items on the stairs. Make sure that there are handrails on both sides of the stairs and use them. Fix handrails that are broken  or loose. Make sure that handrails are as long as the stairways. Check any carpeting to make sure that it is firmly attached to the stairs. Fix any carpet that is loose or worn. Avoid having throw rugs at the top or bottom of the stairs. If you do have throw rugs, attach them to the floor with carpet tape. Make sure that you have a light switch at the top of the stairs and the bottom of the stairs. If you do not have them, ask someone to add them for you. What else can I do to help prevent falls? Wear shoes that: Do not have high heels. Have rubber bottoms. Are comfortable and fit you well. Are closed at the toe. Do not wear sandals. If you use a stepladder: Make sure that it is fully opened. Do not climb a closed stepladder. Make sure that both sides of the stepladder are locked into place. Ask someone to hold it for you, if possible. Clearly mark and make sure that you can see: Any grab bars or handrails. First and last  steps. Where the edge of each step is. Use tools that help you move around (mobility aids) if they are needed. These include: Canes. Walkers. Scooters. Crutches. Turn on the lights when you go into a dark area. Replace any light bulbs as soon as they burn out. Set up your furniture so you have a clear path. Avoid moving your furniture around. If any of your floors are uneven, fix them. If there are any pets around you, be aware of where they are. Review your medicines with your doctor. Some medicines can make you feel dizzy. This can increase your chance of falling. Ask your doctor what other things that you can do to help prevent falls. This information is not intended to replace advice given to you by your health care provider. Make sure you discuss any questions you have with your health care provider. Document Released: 03/23/2009 Document Revised: 11/02/2015 Document Reviewed: 07/01/2014 Elsevier Interactive Patient Education  2017 [employer].

## 2022-08-02 ENCOUNTER — Other Ambulatory Visit: Payer: Self-pay | Admitting: Family Medicine

## 2022-08-14 ENCOUNTER — Other Ambulatory Visit: Payer: Self-pay | Admitting: Family Medicine

## 2022-08-15 ENCOUNTER — Other Ambulatory Visit: Payer: Self-pay | Admitting: Family Medicine

## 2022-09-18 ENCOUNTER — Other Ambulatory Visit: Payer: Self-pay | Admitting: Orthopedic Surgery

## 2022-09-18 DIAGNOSIS — S32010A Wedge compression fracture of first lumbar vertebra, initial encounter for closed fracture: Secondary | ICD-10-CM

## 2022-09-18 DIAGNOSIS — M546 Pain in thoracic spine: Secondary | ICD-10-CM

## 2022-09-23 ENCOUNTER — Ambulatory Visit
Admission: RE | Admit: 2022-09-23 | Discharge: 2022-09-23 | Disposition: A | Discharge status: Home / Self Care | Payer: Medicare Other | Source: Ambulatory Visit | Attending: Orthopedic Surgery | Admitting: Orthopedic Surgery

## 2022-09-23 DIAGNOSIS — S32010A Wedge compression fracture of first lumbar vertebra, initial encounter for closed fracture: Secondary | ICD-10-CM

## 2022-09-23 DIAGNOSIS — M546 Pain in thoracic spine: Secondary | ICD-10-CM

## 2022-09-27 ENCOUNTER — Other Ambulatory Visit: Payer: Self-pay | Admitting: Orthopedic Surgery

## 2022-09-27 DIAGNOSIS — S32010A Wedge compression fracture of first lumbar vertebra, initial encounter for closed fracture: Secondary | ICD-10-CM

## 2022-10-01 ENCOUNTER — Ambulatory Visit
Admission: RE | Admit: 2022-10-01 | Discharge: 2022-10-01 | Disposition: A | Discharge status: Home / Self Care | Payer: Medicare Other | Source: Ambulatory Visit | Attending: Orthopedic Surgery | Admitting: Orthopedic Surgery

## 2022-10-01 ENCOUNTER — Other Ambulatory Visit: Payer: Self-pay | Admitting: Orthopedic Surgery

## 2022-10-01 DIAGNOSIS — M8008XA Age-related osteoporosis with current pathological fracture, vertebra(e), initial encounter for fracture: Secondary | ICD-10-CM

## 2022-10-01 DIAGNOSIS — S32010A Wedge compression fracture of first lumbar vertebra, initial encounter for closed fracture: Secondary | ICD-10-CM

## 2022-10-01 HISTORY — PX: IR RADIOLOGIST EVAL & MGMT: IMG5224

## 2022-10-01 NOTE — Consult Note (Signed)
Chief Complaint: Patient was seen in consultation today for L1 compression fracture at the request of Gaines,Thomas C  Referring Physician(s): Gaines,Thomas C  History of Present Illness: Devin Stanton is a 62 y.o. male who was in his usual state of relatively good health until this past March.  He was out golfing when he tried to strike a ball with a 3 iron on an incline of the hill.  His club face hit the ground and the shock resulted in immediate pain in his back at the thoracolumbar interface.  Initially thinking he had sprained a muscle he attempted conservative therapy without success.  Ultimately, MR imaging confirmed an acute compression fracture of the L1 vertebral body with approximately 25% height loss.  A DEXA was also obtained on 09/19/2022 revealing a T-score of -2.3 at the L2 level consistent with osteopenia/low bone mass.  Unfortunately, his symptoms have remained quite severe.  His pain is an 8 out of 10 on a 10 point scale if he does not take pain medications.  Taking pain medications does help alleviate his pain down to a 2 out of 10, however this is only if he is completely resting.  Any efforts to be active or perform activities of daily living resulted reexacerbation of the pain.  Therefore, his symptoms are quite disabling.  He scored an 18 out of 24 on the [employer] disability questionnaire.  Past Medical History:  Diagnosis Date   Backache, unspecified    COVID    ED (erectile dysfunction)    History of kidney stones 1995   "passed them" (04/08/2016)   Hyperlipidemia    controlled medically   Unspecified essential hypertension    controlled   Unspecified personal history presenting hazards to health     Past Surgical History:  Procedure Laterality Date   ANTERIOR CERVICAL DECOMP/DISCECTOMY FUSION  06/2015   "C3-4-5"   BACK SURGERY     CARDIAC CATHETERIZATION N/A 04/09/2016   Procedure: Left Heart Cath and Coronary Angiography;  Surgeon: Marykay Lex, MD;  Location: [facility] INVASIVE CV LAB;  Service: Cardiovascular;  Laterality: N/A;   CYST EXCISION Left ~ 2013   "mucoid cyst; middle finger"   IR RADIOLOGIST EVAL & MGMT  10/01/2022   PERIPHERAL VASCULAR CATHETERIZATION N/A 04/09/2016   Procedure: Abdominal Aortogram;  Surgeon: Marykay Lex, MD;  Location: [facility] INVASIVE CV LAB;  Service: Cardiovascular;  Laterality: N/A;   SHOULDER ARTHROSCOPY WITH BICEPS TENDON REPAIR Left 07/12/2021   Procedure: Limited arthroscopic debridement, arthroscopic subacromial decompression, and mini-open biceps tenodesis, left shoulder.;  Surgeon: Christena Flake, MD;  Location: [facility] ORS;  Service: Orthopedics;  Laterality: Left;   TONSILLECTOMY     VASECTOMY  ~ 2012    Allergies: Patient has no known allergies.  Medications: Prior to Admission medications   Medication Sig Start Date End Date Taking? Authorizing Provider  amLODipine (NORVASC) 10 MG tablet TAKE 1 TABLET BY MOUTH EVERY DAY 08/14/22   Bedsole, Amy E, MD  aspirin 81 MG tablet Take 81 mg by mouth daily.    [provider]  benazepril (LOTENSIN) 40 MG tablet TAKE 1 TABLET BY MOUTH EVERY DAY 08/02/22   Bedsole, Amy E, MD  clobetasol cream (TEMOVATE) 0.05 % Apply 1 application topically 2 (two) times daily as needed (irritation).    [provider]  meloxicam (MOBIC) 15 MG tablet Take 1 tablet by mouth daily. 06/04/22   [provider]  metoprolol succinate (TOPROL-XL) 50 MG 24 hr  tablet TAKE 1 TABLET BY MOUTH EVERY DAY WITH OR IMMEDIATELY FOLLOWING A MEAL 07/08/22   Bedsole, Amy E, MD  rOPINIRole (REQUIP) 0.5 MG tablet Take 1 tablet (0.5 mg total) by mouth at bedtime. 11/28/21   Bedsole, Amy E, MD  rosuvastatin (CRESTOR) 10 MG tablet TAKE 1 TABLET BY MOUTH EVERY DAY 08/15/22   Excell Seltzer, MD     Family History  Problem Relation Age of Onset   Parkinsonism Mother    Macular degeneration Mother    Leukemia Father    Coronary artery disease Father    Heart disease  Brother 29       CABG x 5   Heart disease Brother    Stroke Brother 75   Colon cancer Neg Hx    Esophageal cancer Neg Hx    Stomach cancer Neg Hx     Social History   Socioeconomic History   Marital status: Married    Spouse name: Not on file   Number of children: 3   Years of education: 16   Highest education level: Not on file  Occupational History   Occupation: Public house manager: [employer]  Tobacco Use   Smoking status: Former    Types: Cigars   Smokeless tobacco: Current    Types: Snuff  Vaping Use   Vaping Use: Never used  Substance and Sexual Activity   Alcohol use: Yes    Alcohol/week: 8.0 standard drinks of alcohol    Types: 8 Glasses of wine per week   Drug use: No   Sexual activity: Yes    Partners: Female  Other Topics Concern   Not on file  Social History Narrative   [employer] / 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 2nd marriage- '06-08, 3rd marriage 12/10   1 son- '91,step son '91, step daughter '93.    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   Social Determinants of Health   Financial Resource Strain: Low Risk  (07/10/2022)   Overall Financial Resource Strain (CARDIA)    Difficulty of Paying Living Expenses: Not hard at all  Food Insecurity: No Food Insecurity (07/10/2022)   Hunger Vital Sign    Worried About Running Out of Food in the Last Year: Never true    Ran Out of Food in the Last Year: Never true  Transportation Needs: No Transportation Needs (07/10/2022)   PRAPARE - Administrator, Civil Service (Medical): No    Lack of Transportation (Non-Medical): No  Physical Activity: Insufficiently Active (07/10/2022)   Exercise Vital Sign    Days of Exercise per Week: 2 days    Minutes of Exercise per Session: 30 min  Stress: No Stress Concern Present (07/10/2022)   [employer] of Occupational Health - Occupational Stress Questionnaire    Feeling of Stress :  Not at all  Social Connections: Moderately Isolated (07/10/2022)   Social Connection and Isolation Panel [NHANES]    Frequency of Communication with Friends and Family: More than three times a week    Frequency of Social Gatherings with Friends and Family: More than three times a week    Attends Religious Services: More than 4 times per year    Active Member of [employer] or Organizations: No    Attends Banker Meetings: Never    Marital Status: Widowed     Review of Systems: A 12 point ROS discussed and pertinent  positives are indicated in the HPI above.  All other systems are negative.  Review of Systems  Vital Signs: BP 137/80   Pulse 77   Temp 98.4 F (36.9 C)   Resp 16   SpO2 97%     Physical Exam Constitutional:      General: He is not in acute distress.    Appearance: Normal appearance. He is normal weight.  HENT:     Head: Normocephalic and atraumatic.  Eyes:     General: No scleral icterus. Cardiovascular:     Rate and Rhythm: Normal rate.  Pulmonary:     Effort: Pulmonary effort is normal.  Abdominal:     General: Abdomen is flat. There is no distension.  Musculoskeletal:       Back:     Comments: TTP at the L1 spinous process  Skin:    General: Skin is warm and dry.  Neurological:     Mental Status: He is alert and oriented to person, place, and time.  Psychiatric:        Mood and Affect: Mood normal.        Behavior: Behavior normal.       Imaging: IR Radiologist Eval & Mgmt  Result Date: 10/01/2022 EXAM: NEW PATIENT OFFICE VISIT CHIEF COMPLAINT: SEE EPIC NOTE HISTORY OF PRESENT ILLNESS: SEE EPIC NOTE REVIEW OF SYSTEMS: SEE EPIC NOTE PHYSICAL EXAMINATION: SEE EPIC NOTE ASSESSMENT AND PLAN: SEE EPIC NOTE Electronically Signed   By: Malachy Moan M.D.   On: 10/01/2022 11:36   MR THORACIC SPINE WO CONTRAST  Result Date: 09/26/2022 CLINICAL DATA:  Left flank pain after playing golf. EXAM: MRI THORACIC AND LUMBAR SPINE WITHOUT CONTRAST  TECHNIQUE: Multiplanar and multiecho pulse sequences of the thoracic and lumbar spine were obtained without intravenous contrast. COMPARISON:  CT chest 04/04/2021, CT abdomen/pelvis 01/02/2020 thoracic spine MRI 04/15/2018 FINDINGS: MRI THORACIC SPINE FINDINGS Alignment:  Normal. Vertebrae: Vertebral body heights are preserved. Background marrow signal is somewhat heterogeneous with a few benign intraosseous hemangiomas. There is no suspicious marrow signal abnormality. There is no marrow edema in the thoracic spine. Cord:  Normal in signal and morphology. Paraspinal and other soft tissues: Unremarkable. Disc levels: The disc heights are preserved. There is no significant disc herniation. There is no significant spinal canal or neural foraminal stenosis. MRI LUMBAR SPINE FINDINGS Segmentation: Standard; the lowest formed disc space is designated L5-S1. Alignment:  Normal. Vertebrae: There is a 2.6 cm hemangioma in the L1 vertebral body. There is superimposed acute appearing compression fracture with up to approximately 25% loss of vertebral body height and no bony retropulsion. The other vertebral body heights are preserved. There is no other evidence of acute fracture. There are additional small benign hemangiomas in the right L2 pedicle and L5 vertebral body. There is no suspicious marrow signal abnormality. Conus medullaris and cauda equina: Conus extends to the L1 level. Conus and cauda equina appear normal. Paraspinal and other soft tissues: Unremarkable. Disc levels: T12-L1: There is a minimal disc bulge without significant spinal canal or neural foraminal stenosis L1-L2: There is a left paracentral disc protrusion and annular fissure without significant spinal canal or neural foraminal stenosis L2-L3: No significant spinal canal or neural foraminal stenosis L3-L4: No significant spinal canal or neural foraminal stenosis. L4-L5: Mild facet arthropathy with no significant spinal canal or neural foraminal  stenosis. L5-S1: No significant spinal canal or neural foraminal stenosis. IMPRESSION: 1. Acute compression fracture of the L1 vertebral body with approximately 25% loss of  vertebral body height and no bony retropulsion. This is superimposed on pre-existing 2.6 cm hemangioma within the vertebral body. 2. Otherwise, essentially unremarkable appearance of the thoracic and lumbar spine with no other acute finding or significant degenerative change. These results will be called to the ordering clinician or representative by the Radiologist Assistant, and communication documented in the PACS or [employer]. Electronically Signed   By: Lesia Hausen M.D.   On: 09/26/2022 10:28   MR LUMBAR SPINE WO CONTRAST  Result Date: 09/26/2022 CLINICAL DATA:  Left flank pain after playing golf. EXAM: MRI THORACIC AND LUMBAR SPINE WITHOUT CONTRAST TECHNIQUE: Multiplanar and multiecho pulse sequences of the thoracic and lumbar spine were obtained without intravenous contrast. COMPARISON:  CT chest 04/04/2021, CT abdomen/pelvis 01/02/2020 thoracic spine MRI 04/15/2018 FINDINGS: MRI THORACIC SPINE FINDINGS Alignment:  Normal. Vertebrae: Vertebral body heights are preserved. Background marrow signal is somewhat heterogeneous with a few benign intraosseous hemangiomas. There is no suspicious marrow signal abnormality. There is no marrow edema in the thoracic spine. Cord:  Normal in signal and morphology. Paraspinal and other soft tissues: Unremarkable. Disc levels: The disc heights are preserved. There is no significant disc herniation. There is no significant spinal canal or neural foraminal stenosis. MRI LUMBAR SPINE FINDINGS Segmentation: Standard; the lowest formed disc space is designated L5-S1. Alignment:  Normal. Vertebrae: There is a 2.6 cm hemangioma in the L1 vertebral body. There is superimposed acute appearing compression fracture with up to approximately 25% loss of vertebral body height and no bony retropulsion. The  other vertebral body heights are preserved. There is no other evidence of acute fracture. There are additional small benign hemangiomas in the right L2 pedicle and L5 vertebral body. There is no suspicious marrow signal abnormality. Conus medullaris and cauda equina: Conus extends to the L1 level. Conus and cauda equina appear normal. Paraspinal and other soft tissues: Unremarkable. Disc levels: T12-L1: There is a minimal disc bulge without significant spinal canal or neural foraminal stenosis L1-L2: There is a left paracentral disc protrusion and annular fissure without significant spinal canal or neural foraminal stenosis L2-L3: No significant spinal canal or neural foraminal stenosis L3-L4: No significant spinal canal or neural foraminal stenosis. L4-L5: Mild facet arthropathy with no significant spinal canal or neural foraminal stenosis. L5-S1: No significant spinal canal or neural foraminal stenosis. IMPRESSION: 1. Acute compression fracture of the L1 vertebral body with approximately 25% loss of vertebral body height and no bony retropulsion. This is superimposed on pre-existing 2.6 cm hemangioma within the vertebral body. 2. Otherwise, essentially unremarkable appearance of the thoracic and lumbar spine with no other acute finding or significant degenerative change. These results will be called to the ordering clinician or representative by the Radiologist Assistant, and communication documented in the PACS or [employer]. Electronically Signed   By: Lesia Hausen M.D.   On: 09/26/2022 10:28    Labs:  CBC: No results for input(s): "WBC", "HGB", "HCT", "PLT" in the last 8760 hours.  COAGS: No results for input(s): "INR", "APTT" in the last 8760 hours.  BMP: Recent Labs    06/20/22 0804  NA 138  K 4.4  CL 100  CO2 28  GLUCOSE 92  BUN 18  CALCIUM 9.8  CREATININE 1.22    LIVER FUNCTION TESTS: Recent Labs    06/20/22 0804  BILITOT 0.5  AST 29  ALT 39  ALKPHOS 73  PROT 7.6   ALBUMIN 4.7    TUMOR MARKERS: No results for input(s): "AFPTM", "CEA", "CA199", "  CHROMGRNA" in the last 8760 hours.  Assessment & Plan:   Patient has suffered subacute osteoporotic fracture of the L1 vertebra.   History and exam have demonstrated the following:  Acute/Subacute fracture by imaging dated 4/18/20274, Pain on exam concordant with level of fracture, Failure of conservative therapy and pain refractory to narcotic pain mediation, and Significant disability on the [employer] Disability Questionnaire with 18/24 positive symptoms, reflecting significant impact/impairment of (ADLs)   ICD-10-CM Codes that Support Medical Necessity ([email or web address]?[email or web address])  M80.08XA    Age-related osteoporosis with current pathological fracture, vertebra(e), initial encounter for fracture and S32.010A    Wedge compression fracture of first lumbar vertebra, initial encounter for closed fracture    Plan:  L1 vertebral body augmentation with balloon kyphoplasty  Post-procedure disposition: outpatient at [facility] Medication holds: aspirin 5 days  The patient has suffered a fracture of the L1 vertebral body. It is recommended that patients aged 13 years or older be evaluated for possible testing or treatment of osteoporosis. A copy of this consult report is sent to the patient's referring physician.    Total time spent on today's visit was over  40 Minutes , including both face-to-face time and non face-to-face time, personally spent on review of chart (including labs and relevant imaging), discussing further workup and treatment options, referral to specialist if needed, reviewing outside records if pertinent, answering patient questions, and coordinating care regarding osteoporotic L1 compression fracture as well as management strategy.    Electronically Signed: Kandis Cocking Lenzie Sandler 10/01/2022, 12:11 PM

## 2022-10-07 MED ORDER — FENTANYL CITRATE PF 50 MCG/ML IJ SOSY
25.0000 ug | PREFILLED_SYRINGE | INTRAMUSCULAR | Status: DC | PRN
  Administered 2022-10-08 (×3): 50 ug via INTRAVENOUS

## 2022-10-07 MED ORDER — MIDAZOLAM HCL 2 MG/2ML IJ SOLN
1.0000 mg | INTRAMUSCULAR | Status: DC | PRN
  Administered 2022-10-08 (×3): 1 mg via INTRAVENOUS

## 2022-10-07 NOTE — Discharge Instructions (Signed)
Kyphoplasty Post Procedure Discharge Instructions  May resume a regular diet and any medications that you routinely take (including pain medications). However, if you are taking Aspirin or an anticoagulant/blood thinner you will be told when you can resume taking these by the healthcare provider. No driving day of procedure. The day of your procedure take it easy. You may use an ice pack as needed to injection sites on back.  Ice to back 30 minutes on and 30 minutes off, as needed. May remove bandaids tomorrow after taking a shower. Replace daily with a clean bandaid until healed.  Do not lift anything heavier than a milk jug for 1-2 weeks or determined by your physician.  Follow up with your physician in 2 weeks.    Please contact our office at [phone] for the following symptoms or if you have any questions:  Fever greater than 100 degrees Increased swelling, pain, or redness at injection site. Increased back and/or leg pain New numbness or change in symptoms from before the procedure.    Thank you for visiting  Imaging.   May resume aspirin immediately after procedure! 

## 2022-10-08 ENCOUNTER — Ambulatory Visit
Admission: RE | Admit: 2022-10-08 | Discharge: 2022-10-08 | Disposition: A | Discharge status: Home / Self Care | Payer: Medicare Other | Source: Ambulatory Visit | Attending: Orthopedic Surgery | Admitting: Orthopedic Surgery

## 2022-10-08 DIAGNOSIS — M8008XA Age-related osteoporosis with current pathological fracture, vertebra(e), initial encounter for fracture: Secondary | ICD-10-CM

## 2022-10-08 HISTORY — PX: IR KYPHO LUMBAR INC FX REDUCE BONE BX UNI/BIL CANNULATION INC/IMAGING: IMG5519

## 2022-10-08 MED ORDER — NALOXONE HCL 0.4 MG/ML IJ SOLN
0.4000 mg | INTRAMUSCULAR | Status: DC | PRN
  Administered 2022-10-08: 0.08 mg via INTRAVENOUS

## 2022-10-08 MED ORDER — SODIUM CHLORIDE 0.9 % IV SOLN: INTRAVENOUS | Status: DC

## 2022-10-08 MED ORDER — KETOROLAC TROMETHAMINE 30 MG/ML IJ SOLN
30.0000 mg | Freq: Once | INTRAMUSCULAR | Status: AC
  Administered 2022-10-08: 30 mg via INTRAVENOUS

## 2022-10-08 MED ORDER — DIPHENHYDRAMINE HCL 50 MG/ML IJ SOLN
25.0000 mg | Freq: Once | INTRAMUSCULAR | Status: AC
  Administered 2022-10-08: 25 mg via INTRAVENOUS

## 2022-10-08 MED ORDER — FLUMAZENIL 0.5 MG/5ML IV SOLN
0.5000 mg | INTRAVENOUS | Status: DC | PRN
  Administered 2022-10-08: 0.2 mg via INTRAVENOUS

## 2022-10-08 MED ORDER — CEFAZOLIN SODIUM-DEXTROSE 2-4 GM/100ML-% IV SOLN
2.0000 g | INTRAVENOUS | Status: AC
  Administered 2022-10-08: 2 g via INTRAVENOUS

## 2022-10-08 NOTE — Progress Notes (Signed)
Pt back in nursing recovery area. Pt still drowsy from procedure but will wake up when spoken to. Pt follows commands, talks in complete sentences and has no complaints at this time. Pt will remain in nursing station until discharge.  ?

## 2022-10-08 NOTE — Progress Notes (Signed)
Pt. Was given 1mg  of Versed and of Fentanyl at 0950 prior to the start of the procedure. Dr. Bryn Gulling began the procedure and administered lidocaine prior to inserting trocar to gain access for L1 kyphoplasty. Pt. Immediately started kicking his legs, yelling, and moving on the table. Dr. Bryn Gulling asked how much was given RN reported and then was told to to give another 1mg  of Versed and of Fentanyl. RN ensure that it had been 5 minutes in between the last administration. RN Alphonsine Minium gave 1mg  Versed and Fentanyl at 0955. The pt. Remained agitated. RN then gave another 1mg  Versed and 50 mcg Fentanyl at MD request at 1000. Pt. Was still actively talking and yelling out when any attempt to continue to the procedure was made. Dr. Bryn Gulling then requested that the pt. Receive 25mg  benadryl. RN Dashayla Theissen administered 25mg  benadryl at 1003. Pt. Then became unresponsive to pain. Pt. Vitals remained stable throughout but Dr. Bryn Gulling felt the pt. Was too sedated. Dr. Bryn Gulling then asked RN Nikash Mortensen to reverse the pt. RN Destinee Taber then gave flumazenil 0.2mg  at 1005. Pt. Remained unresponsive to voice but was responding to pain. Dr. Bryn Gulling wanted to continue with Narcan. RN Ilma Achee administered .08mg  of narcan. The pt. Was arousable at this point and comfortable proceeding with the procedure. Dr. Bryn Gulling continued the procedure. The pt. Remained stable throughout the procedure without any further sedation.

## 2022-10-16 ENCOUNTER — Telehealth: Payer: Self-pay

## 2022-10-16 NOTE — Telephone Encounter (Signed)
Phone call to pt to follow up from her kyphoplasty on 10/08/22. Pt reports her pain is completely gone post procedure. Pt reports she is able to move around a little better. Pt denies any signs of infection, redness at the site, draining or fever. Pt has no complaints at this time and will be scheduled for a telephone follow up with Dr. Juliette Alcide next week. Pt advised to call back if anything were to change or any concerns arise and we will arrange an in person appointment. Pt verbalized understanding.

## 2022-10-17 ENCOUNTER — Other Ambulatory Visit: Payer: Self-pay | Admitting: Interventional Radiology

## 2022-10-22 ENCOUNTER — Ambulatory Visit
Admission: RE | Admit: 2022-10-22 | Discharge: 2022-10-22 | Disposition: A | Discharge status: Home / Self Care | Payer: Medicare Other | Source: Ambulatory Visit | Attending: Interventional Radiology | Admitting: Interventional Radiology

## 2022-10-23 ENCOUNTER — Other Ambulatory Visit: Payer: Self-pay | Admitting: Interventional Radiology

## 2022-10-23 DIAGNOSIS — Z09 Encounter for follow-up examination after completed treatment for conditions other than malignant neoplasm: Secondary | ICD-10-CM

## 2022-10-25 ENCOUNTER — Ambulatory Visit
Admission: RE | Admit: 2022-10-25 | Discharge: 2022-10-25 | Disposition: A | Discharge status: Home / Self Care | Payer: Medicare Other | Source: Ambulatory Visit | Attending: Interventional Radiology | Admitting: Interventional Radiology

## 2022-10-25 DIAGNOSIS — Z09 Encounter for follow-up examination after completed treatment for conditions other than malignant neoplasm: Secondary | ICD-10-CM

## 2022-10-25 HISTORY — PX: IR RADIOLOGIST EVAL & MGMT: IMG5224

## 2022-10-28 ENCOUNTER — Other Ambulatory Visit: Payer: Self-pay | Admitting: Family Medicine

## 2023-01-08 ENCOUNTER — Encounter: Payer: Self-pay | Admitting: Family Medicine

## 2023-01-09 NOTE — Telephone Encounter (Signed)
Noted and agree with urgent care er precautions.  Pt to monitor bruising site and ensure is not increasing in size as he does take a baby aspirin.

## 2023-01-09 NOTE — Telephone Encounter (Signed)
Pt notified as instructed and pt voiced understanding. 

## 2023-01-09 NOTE — Telephone Encounter (Signed)
I spoke with pt; three days ago started with fever 101.3 , H/A and muscular pain from back to knees. Also bruising on back mid thorax to lumbar area with no known injury. Home covid test neg. Pt said last took tylenol and ibuprofen last night and no fever this morning and pt is feeling better; no h/a. Pt still has bruising and not sure why. Offered pt appt today but pt wants to wait and see Dr Ermalene Searing and scheduled appt on 01/14/23 at 3:40 with UC & ED precautions given and pt voiced understanding. Sending note to Dr Ermalene Searing who is out of office and TDugal FNP who is in office.

## 2023-01-10 NOTE — Telephone Encounter (Signed)
Noted  

## 2023-01-14 ENCOUNTER — Ambulatory Visit (INDEPENDENT_AMBULATORY_CARE_PROVIDER_SITE_OTHER): Payer: Medicare Other | Admitting: Family Medicine

## 2023-01-14 ENCOUNTER — Encounter: Payer: Self-pay | Admitting: Family Medicine

## 2023-01-14 VITALS — BP 120/70 | HR 83 | Temp 98.0°F | Ht 67.5 in | Wt 180.4 lb

## 2023-01-14 DIAGNOSIS — R233 Spontaneous ecchymoses: Secondary | ICD-10-CM | POA: Insufficient documentation

## 2023-01-14 NOTE — Assessment & Plan Note (Signed)
Acute, likely secondary to viral syndrome.  Given unusual nature of bruising on back and family history of leukemia will evaluate with CBC and complete metabolic panel.  Patient reports all other viral symptoms have resolved entirely.

## 2023-01-14 NOTE — Progress Notes (Signed)
Patient ID: Devin Stanton, male    DOB: 04-15-1961, 62 y.o.   MRN: [id number]  This visit was conducted in person.  BP 120/70 (BP Location: Left Arm, Patient Position: Sitting, Cuff Size: Normal)   Pulse 83   Temp 98 F (36.7 C) (Temporal)   Ht 5' 7.5" (1.715 m)   Wt 180 lb 6 oz (81.8 kg)   SpO2 96%   BMI 27.83 kg/m    CC:  Chief Complaint  Patient presents with   Bleeding/Bruising    On Back.  Patient thinks he had Covid at the time this occurred.    Subjective:   HPI: Devin Stanton is a 62 y.o. male presenting on 01/14/2023 for Bleeding/Bruising (On Back.  Patient thinks he had Covid at the time this occurred.)  7/28 pt noted high fever  101.3 F  Headache and body ache.  Mild congestion, no cough  He thinks it was COVID.   Noted bruising on back bilaterally... resolved with time.  No no other bruising.   He feels better overall.   No blood in stool, urine. No nose bleeding.  Wt Readings from Last 3 Encounters:  01/14/23 180 lb 6 oz (81.8 kg)  07/10/22 177 lb (80.3 kg)  06/27/22 177 lb 2 oz (80.3 kg)      Father with leukemia, likely acute.       Relevant past medical, surgical, family and social history reviewed and updated as indicated. Interim medical history since our last visit reviewed. Allergies and medications reviewed and updated. Outpatient Medications Prior to Visit  Medication Sig Dispense Refill   amLODipine (NORVASC) 10 MG tablet TAKE 1 TABLET BY MOUTH EVERY DAY 30 tablet 11   aspirin 81 MG tablet Take 81 mg by mouth daily.     benazepril (LOTENSIN) 40 MG tablet TAKE 1 TABLET BY MOUTH EVERY DAY 30 tablet 11   clobetasol cream (TEMOVATE) 0.05 % Apply 1 application topically 2 (two) times daily as needed (irritation).     meloxicam (MOBIC) 15 MG tablet Take 1 tablet by mouth daily.     metoprolol succinate (TOPROL-XL) 50 MG 24 hr tablet TAKE 1 TABLET BY MOUTH EVERY DAY WITH OR IMMEDIATELY FOLLOWING A MEAL 90 tablet 3   rOPINIRole (REQUIP)  0.5 MG tablet TAKE 1 TABLET BY MOUTH AT BEDTIME. 90 tablet 1   rosuvastatin (CRESTOR) 10 MG tablet TAKE 1 TABLET BY MOUTH EVERY DAY 30 tablet 11   No facility-administered medications prior to visit.     Per HPI unless specifically indicated in ROS section below Review of Systems  Constitutional:  Negative for fatigue and fever.  HENT:  Negative for ear pain and nosebleeds.   Eyes:  Negative for pain.  Respiratory:  Negative for cough and shortness of breath.   Cardiovascular:  Negative for chest pain, palpitations and leg swelling.  Gastrointestinal:  Negative for abdominal pain and nausea.  Genitourinary:  Negative for dysuria.  Musculoskeletal:  Negative for arthralgias.  Skin:        Easy bruising  Neurological:  Negative for syncope, light-headedness and headaches.  Psychiatric/Behavioral:  Negative for dysphoric mood.    Objective:  BP 120/70 (BP Location: Left Arm, Patient Position: Sitting, Cuff Size: Normal)   Pulse 83   Temp 98 F (36.7 C) (Temporal)   Ht 5' 7.5" (1.715 m)   Wt 180 lb 6 oz (81.8 kg)   SpO2 96%   BMI 27.83 kg/m   Wt Readings from Last 3  Encounters:  01/14/23 180 lb 6 oz (81.8 kg)  07/10/22 177 lb (80.3 kg)  06/27/22 177 lb 2 oz (80.3 kg)      Physical Exam Constitutional:      Appearance: He is well-developed.  HENT:     Head: Normocephalic.     Right Ear: Hearing normal.     Left Ear: Hearing normal.     Nose: Nose normal.     Mouth/Throat:     Mouth: Oropharynx is clear and moist and mucous membranes are normal.  Neck:     Thyroid: No thyroid mass or thyromegaly.     Vascular: No carotid bruit.     Trachea: Trachea normal.  Cardiovascular:     Rate and Rhythm: Normal rate and regular rhythm.     Pulses: Normal pulses.     Heart sounds: Heart sounds not distant. No murmur heard.    No friction rub. No gallop.     Comments: No peripheral edema Pulmonary:     Effort: Pulmonary effort is normal. No respiratory distress.     Breath  sounds: Normal breath sounds.  Skin:    General: Skin is warm, dry and intact.     Findings: Rash present.  Psychiatric:        Mood and Affect: Mood and affect normal.        Speech: Speech normal.        Behavior: Behavior normal.        Thought Content: Thought content normal.       Results for orders placed or performed in visit on 06/20/22  PSA, Medicare  Result Value Ref Range   PSA 0.86 0.10 - 4.00 ng/ml  Comprehensive metabolic panel  Result Value Ref Range   Sodium 138 135 - 145 mEq/L   Potassium 4.4 3.5 - 5.1 mEq/L   Chloride 100 96 - 112 mEq/L   CO2 28 19 - 32 mEq/L   Glucose, Bld 92 70 - 99 mg/dL   BUN 18 6 - 23 mg/dL   Creatinine, Ser 1.61 0.40 - 1.50 mg/dL   Total Bilirubin 0.5 0.2 - 1.2 mg/dL   Alkaline Phosphatase 73 39 - 117 U/L   AST 29 0 - 37 U/L   ALT 39 0 - 53 U/L   Total Protein 7.6 6.0 - 8.3 g/dL   Albumin 4.7 3.5 - 5.2 g/dL   GFR 09.60 >45.40 mL/min   Calcium 9.8 8.4 - 10.5 mg/dL  Hemoglobin J8J  Result Value Ref Range   Hgb A1c MFr Bld 6.1 4.6 - 6.5 %  Lipid panel  Result Value Ref Range   Cholesterol 180 0 - 200 mg/dL   Triglycerides 191.4 0.0 - 149.0 mg/dL   HDL 78.29 >56.21 mg/dL   VLDL 30.8 0.0 - 65.7 mg/dL   LDL Cholesterol 69 0 - 99 mg/dL   Total CHOL/HDL Ratio 2    NonHDL 92.61     Assessment and Plan  Easy bruising Assessment & Plan: Acute, likely secondary to viral syndrome.  Given unusual nature of bruising on back and family history of leukemia will evaluate with CBC and complete metabolic panel.  Patient reports all other viral symptoms have resolved entirely.  Orders: -     CBC with Differential/Platelet -     Comprehensive metabolic panel    No follow-ups on file.   Kerby Nora, MD

## 2023-01-31 ENCOUNTER — Encounter: Payer: Self-pay | Admitting: Family Medicine

## 2023-02-05 ENCOUNTER — Other Ambulatory Visit: Payer: Self-pay | Admitting: Family Medicine

## 2023-02-05 MED ORDER — TADALAFIL 5 MG PO TABS
5.0000 mg | ORAL_TABLET | Freq: Every day | ORAL | 11 refills | Status: DC
Start: 2023-02-05 — End: 2023-05-12

## 2023-03-28 ENCOUNTER — Other Ambulatory Visit: Payer: Self-pay | Admitting: Unknown Physician Specialty

## 2023-03-28 DIAGNOSIS — D37030 Neoplasm of uncertain behavior of the parotid salivary glands: Secondary | ICD-10-CM

## 2023-04-01 ENCOUNTER — Inpatient Hospital Stay: Admission: RE | Admit: 2023-04-01 | Payer: Medicare Other | Source: Ambulatory Visit

## 2023-04-25 ENCOUNTER — Other Ambulatory Visit: Payer: Self-pay | Admitting: Family Medicine

## 2023-05-12 ENCOUNTER — Other Ambulatory Visit: Payer: Self-pay | Admitting: *Deleted

## 2023-05-12 MED ORDER — TADALAFIL 5 MG PO TABS: 5.0000 mg | ORAL_TABLET | Freq: Every day | ORAL | 0 refills | Status: DC

## 2023-05-12 MED ORDER — METOPROLOL SUCCINATE ER 50 MG PO TB24: 50.0000 mg | ORAL_TABLET | Freq: Every day | ORAL | 0 refills | Status: DC

## 2023-05-12 MED ORDER — ROSUVASTATIN CALCIUM 10 MG PO TABS: 10.0000 mg | ORAL_TABLET | Freq: Every day | ORAL | 0 refills | Status: DC

## 2023-05-12 MED ORDER — ROPINIROLE HCL 0.5 MG PO TABS: 0.5000 mg | ORAL_TABLET | Freq: Every day | ORAL | 0 refills | Status: DC

## 2023-05-12 MED ORDER — BENAZEPRIL HCL 40 MG PO TABS: 40.0000 mg | ORAL_TABLET | Freq: Every day | ORAL | 0 refills | Status: DC

## 2023-05-12 MED ORDER — AMLODIPINE BESYLATE 10 MG PO TABS: 10.0000 mg | ORAL_TABLET | Freq: Every day | ORAL | 0 refills | Status: DC

## 2023-05-12 NOTE — Telephone Encounter (Signed)
Please schedule CPE with fasting labs prior after 07/14/23 with Dr. Ermalene Searing.

## 2023-05-12 NOTE — Telephone Encounter (Signed)
LVM for patient tcb and schedule 

## 2023-06-13 ENCOUNTER — Encounter: Payer: Self-pay | Admitting: Family Medicine

## 2023-06-13 DIAGNOSIS — Z1211 Encounter for screening for malignant neoplasm of colon: Secondary | ICD-10-CM

## 2023-06-17 ENCOUNTER — Telehealth: Payer: Self-pay

## 2023-06-17 ENCOUNTER — Other Ambulatory Visit: Payer: Self-pay

## 2023-06-17 DIAGNOSIS — Z1211 Encounter for screening for malignant neoplasm of colon: Secondary | ICD-10-CM

## 2023-06-17 MED ORDER — GOLYTELY 236 G PO SOLR: 4000.0000 mL | Freq: Once | ORAL | 0 refills | Status: AC

## 2023-06-17 NOTE — Telephone Encounter (Signed)
 Gastroenterology Pre-Procedure Review  Request Date: 07/23/23 Requesting Physician: Dr. Unk  PATIENT REVIEW QUESTIONS: The patient responded to the following health history questions as indicated:    1. Are you having any GI issues? no 2. Do you have a personal history of Polyps? no 3. Do you have a family history of Colon Cancer or Polyps? no 4. Diabetes Mellitus? no 5. Joint replacements in the past 12 months?no 6. Major health problems in the past 3 months?no 7. Any artificial heart valves, MVP, or defibrillator?no    MEDICATIONS & ALLERGIES:    Patient reports the following regarding taking any anticoagulation/antiplatelet therapy:   Plavix, Coumadin, Eliquis, Xarelto, Lovenox , Pradaxa, Brilinta, or Effient? no Aspirin ? no  Patient confirms/reports the following medications:  Current Outpatient Medications  Medication Sig Dispense Refill   amLODipine  (NORVASC ) 10 MG tablet Take 1 tablet (10 mg total) by mouth daily. 90 tablet 0   aspirin  81 MG tablet Take 81 mg by mouth daily.     benazepril  (LOTENSIN ) 40 MG tablet Take 1 tablet (40 mg total) by mouth daily. 90 tablet 0   clobetasol cream (TEMOVATE) 0.05 % Apply 1 application topically 2 (two) times daily as needed (irritation).     meloxicam (MOBIC) 15 MG tablet Take 1 tablet by mouth daily.     metoprolol  succinate (TOPROL -XL) 50 MG 24 hr tablet Take 1 tablet (50 mg total) by mouth daily. Take with or immediately following a meal. 90 tablet 0   rOPINIRole  (REQUIP ) 0.5 MG tablet Take 1 tablet (0.5 mg total) by mouth at bedtime. 90 tablet 0   rosuvastatin  (CRESTOR ) 10 MG tablet Take 1 tablet (10 mg total) by mouth daily. 90 tablet 0   tadalafil  (CIALIS ) 5 MG tablet Take 1 tablet (5 mg total) by mouth daily. 90 tablet 0   No current facility-administered medications for this visit.    Patient confirms/reports the following allergies:  No Known Allergies  No orders of the defined types were placed in this  encounter.   AUTHORIZATION INFORMATION Primary Insurance: 1D#: Group #:  Secondary Insurance: 1D#: Group #:  SCHEDULE INFORMATION: Date: 07/23/23 Time: Location: [facility]

## 2023-07-01 ENCOUNTER — Ambulatory Visit (INDEPENDENT_AMBULATORY_CARE_PROVIDER_SITE_OTHER): Payer: Medicare Other | Admitting: Family Medicine

## 2023-07-01 VITALS — BP 110/64 | HR 67 | Temp 97.6°F | Ht 67.25 in | Wt 182.1 lb

## 2023-07-01 DIAGNOSIS — Z125 Encounter for screening for malignant neoplasm of prostate: Secondary | ICD-10-CM

## 2023-07-01 DIAGNOSIS — R7303 Prediabetes: Secondary | ICD-10-CM | POA: Diagnosis not present

## 2023-07-01 DIAGNOSIS — E785 Hyperlipidemia, unspecified: Secondary | ICD-10-CM | POA: Diagnosis not present

## 2023-07-01 DIAGNOSIS — I10 Essential (primary) hypertension: Secondary | ICD-10-CM

## 2023-07-01 DIAGNOSIS — Z0000 Encounter for general adult medical examination without abnormal findings: Secondary | ICD-10-CM | POA: Diagnosis not present

## 2023-07-01 LAB — COMPREHENSIVE METABOLIC PANEL
ALT: 57 U/L — ABNORMAL HIGH (ref 0–53)
AST: 39 U/L — ABNORMAL HIGH (ref 0–37)
Albumin: 5 g/dL (ref 3.5–5.2)
Alkaline Phosphatase: 54 U/L (ref 39–117)
BUN: 22 mg/dL (ref 6–23)
CO2: 27 meq/L (ref 19–32)
Calcium: 10.8 mg/dL — ABNORMAL HIGH (ref 8.4–10.5)
Chloride: 96 meq/L (ref 96–112)
Creatinine, Ser: 1.26 mg/dL (ref 0.40–1.50)
GFR: 60.87 mL/min (ref >60.00)
Glucose, Bld: 113 mg/dL — ABNORMAL HIGH (ref 70–99)
Potassium: 5.2 meq/L — ABNORMAL HIGH (ref 3.5–5.1)
Sodium: 132 meq/L — ABNORMAL LOW (ref 135–145)
Total Bilirubin: 0.8 mg/dL (ref 0.2–1.2)
Total Protein: 8 g/dL (ref 6.0–8.3)

## 2023-07-01 LAB — LIPID PANEL
Cholesterol: 189 mg/dL (ref 0–200)
HDL: 84.6 mg/dL (ref >39.00)
LDL Cholesterol: 91 mg/dL (ref 0–99)
NonHDL: 104.71
Total CHOL/HDL Ratio: 2
Triglycerides: 68 mg/dL (ref 0.0–149.0)
VLDL: 13.6 mg/dL (ref 0.0–40.0)

## 2023-07-01 LAB — HEMOGLOBIN A1C: Hgb A1c MFr Bld: 5.9 % (ref 4.6–6.5)

## 2023-07-01 LAB — PSA, MEDICARE: PSA: 1.69 ng/mL (ref 0.10–4.00)

## 2023-07-01 NOTE — Assessment & Plan Note (Signed)
Stable, chronic.  Continue current medication.   Good control on amlodipine 10 mg daily, benazepril 40 mg daily, metoprolol 50 mg daily 

## 2023-07-01 NOTE — Assessment & Plan Note (Signed)
Due for re-eval on crestor 10 mg daily. Encouraged exercise, weight loss, healthy eating habits.

## 2023-07-01 NOTE — Progress Notes (Signed)
Patient ID: Devin Stanton, male    DOB: 07-27-1960, 63 y.o.   MRN: [id number]  This visit was conducted in person.  BP 110/64 (BP Location: Left Arm, Patient Position: Sitting, Cuff Size: Large)   Pulse 67   Temp 97.6 F (36.4 C) (Temporal)   Ht 5' 7.25" (1.708 m)   Wt 182 lb 2 oz (82.6 kg)   SpO2 98%   BMI 28.31 kg/m    Chief Complaint  Patient presents with   Annual Exam      Subjective:   HPI: Devin Stanton is a 63 y.o. male presenting on 07/01/2023 for    complete physical  The patient presents for complete physical and review of chronic health problems. He/She also has the following acute concerns today: none  The patient will see a LPN or RN for medicare wellness visit. 07/2023  Prevention and wellness was reviewed in detail. Note reviewed and important notes copied below.   Flowsheet Row Office Visit from 07/01/2023 in [facility] [place]  PHQ-2 Total Score 0      Hypertension:    Good control on amlodipine 10 mg daily, benazepril 40 mg daily, metoprolol 50 mg daily BP Readings from Last 3 Encounters:  07/01/23 110/64  01/14/23 120/70  10/08/22 135/73  Using medication without problems or lightheadedness:  Chest pain with exertion: none Edema:none Short of breath:none Average home BPs: Other issues:  Elevated Cholesterol: Due for re-eval. Crestor 10 mg daily Using medications without problems: Muscle aches:  Diet compliance: moderate Exercise: minimal given back issue.Marland Kitchen trying to get back to it. Other complaints:   Prediabetes Due for re-eval.    Wt Readings from Last 3 Encounters:  07/01/23 182 lb 2 oz (82.6 kg)  01/14/23 180 lb 6 oz (81.8 kg)  07/10/22 177 lb (80.3 kg)    Relevant past medical, surgical, family and social history reviewed and updated as indicated. Interim medical history since our last visit reviewed. Allergies and medications reviewed and updated. Outpatient Medications Prior to Visit   Medication Sig Dispense Refill   amLODipine (NORVASC) 10 MG tablet Take 1 tablet (10 mg total) by mouth daily. 90 tablet 0   aspirin 81 MG tablet Take 81 mg by mouth daily.     benazepril (LOTENSIN) 40 MG tablet Take 1 tablet (40 mg total) by mouth daily. 90 tablet 0   calcitonin, salmon, (MIACALCIN/FORTICAL) 200 UNIT/ACT nasal spray Place 1 spray into alternate nostrils daily.     clobetasol cream (TEMOVATE) 0.05 % Apply 1 application topically 2 (two) times daily as needed (irritation).     meloxicam (MOBIC) 15 MG tablet Take 1 tablet by mouth daily.     metoprolol succinate (TOPROL-XL) 50 MG 24 hr tablet Take 1 tablet (50 mg total) by mouth daily. Take with or immediately following a meal. 90 tablet 0   rOPINIRole (REQUIP) 0.5 MG tablet Take 1 tablet (0.5 mg total) by mouth at bedtime. 90 tablet 0   rosuvastatin (CRESTOR) 10 MG tablet Take 1 tablet (10 mg total) by mouth daily. 90 tablet 0   tadalafil (CIALIS) 5 MG tablet Take 1 tablet (5 mg total) by mouth daily. 90 tablet 0   No facility-administered medications prior to visit.     Per HPI unless specifically indicated in ROS section below Review of Systems  Constitutional:  Negative for fatigue and fever.  HENT:  Negative for ear pain.   Eyes:  Negative for pain.  Respiratory:  Negative for cough and shortness of breath.   Cardiovascular:  Negative for chest pain, palpitations and leg swelling.  Gastrointestinal:  Negative for abdominal pain.  Genitourinary:  Negative for dysuria.  Musculoskeletal:  Negative for arthralgias.  Neurological:  Negative for syncope, light-headedness and headaches.  Psychiatric/Behavioral:  Negative for dysphoric mood.    Objective:  BP 110/64 (BP Location: Left Arm, Patient Position: Sitting, Cuff Size: Large)   Pulse 67   Temp 97.6 F (36.4 C) (Temporal)   Ht 5' 7.25" (1.708 m)   Wt 182 lb 2 oz (82.6 kg)   SpO2 98%   BMI 28.31 kg/m   Wt Readings from Last 3 Encounters:  07/01/23 182 lb 2  oz (82.6 kg)  01/14/23 180 lb 6 oz (81.8 kg)  07/10/22 177 lb (80.3 kg)      Physical Exam Constitutional:      Appearance: He is well-developed.  HENT:     Head: Normocephalic.     Right Ear: Hearing normal.     Left Ear: Hearing normal.     Nose: Nose normal.  Neck:     Thyroid: No thyroid mass or thyromegaly.     Vascular: No carotid bruit.     Trachea: Trachea normal.  Cardiovascular:     Rate and Rhythm: Normal rate and regular rhythm.     Pulses: Normal pulses.     Heart sounds: Heart sounds not distant. No murmur heard.    No friction rub. No gallop.     Comments: No peripheral edema Pulmonary:     Effort: Pulmonary effort is normal. No respiratory distress.     Breath sounds: Normal breath sounds.  Skin:    General: Skin is warm and dry.     Findings: No rash.  Psychiatric:        Speech: Speech normal.        Behavior: Behavior normal.        Thought Content: Thought content normal.       Results for orders placed or performed in visit on 01/14/23  CBC with Differential/Platelet   Collection Time: 01/14/23  4:13 PM  Result Value Ref Range   WBC 13.5 (H) 4.0 - 10.5 K/uL   RBC 4.47 4.22 - 5.81 Mil/uL   Hemoglobin 14.1 13.0 - 17.0 g/dL   HCT 32.4 40.1 - 02.7 %   MCV 95.4 78.0 - 100.0 fl   MCHC 33.0 30.0 - 36.0 g/dL   RDW 25.3 66.4 - 40.3 %   Platelets 375.0 150.0 - 400.0 K/uL   Neutrophils Relative % 64.7 43.0 - 77.0 %   Lymphocytes Relative 26.0 12.0 - 46.0 %   Monocytes Relative 7.3 3.0 - 12.0 %   Eosinophils Relative 1.5 0.0 - 5.0 %   Basophils Relative 0.5 0.0 - 3.0 %   Neutro Abs 8.7 (H) 1.4 - 7.7 K/uL   Lymphs Abs 3.5 0.7 - 4.0 K/uL   Monocytes Absolute 1.0 0.1 - 1.0 K/uL   Eosinophils Absolute 0.2 0.0 - 0.7 K/uL   Basophils Absolute 0.1 0.0 - 0.1 K/uL  Comprehensive metabolic panel   Collection Time: 01/14/23  4:13 PM  Result Value Ref Range   Sodium 130 (L) 135 - 145 mEq/L   Potassium 5.3 Hemolysis seen.. (H) 3.5 - 5.1 mEq/L   Chloride 94  (L) 96 - 112 mEq/L   CO2 24 19 - 32 mEq/L   Glucose, Bld 102 (H) 70 - 99 mg/dL   BUN 19 6 - 23  mg/dL   Creatinine, Ser 0.98 0.40 - 1.50 mg/dL   Total Bilirubin 0.7 0.2 - 1.2 mg/dL   Alkaline Phosphatase 60 39 - 117 U/L   AST 52 (H) 0 - 37 U/L   ALT 53 0 - 53 U/L   Total Protein 7.7 6.0 - 8.3 g/dL   Albumin 4.5 3.5 - 5.2 g/dL   GFR 11.91 (L) >47.82 mL/min   Calcium 10.3 8.4 - 10.5 mg/dL    This visit occurred during the SARS-CoV-2 public health emergency.  Safety protocols were in place, including screening questions prior to the visit, additional usage of staff PPE, and extensive cleaning of exam room while observing appropriate contact time as indicated for disinfecting solutions.   COVID 19 screen:  No recent travel or known exposure to COVID19 The patient denies respiratory symptoms of COVID 19 at this time. The importance of social distancing was discussed today.   Assessment and Plan   The patient's preventative maintenance and recommended screening tests for an annual wellness exam were reviewed in full today. Brought up to date unless services declined.  Counselled on the importance of diet, exercise, and its role in overall health and mortality. The patient's FH and SH was reviewed, including their home life, tobacco status, and drug and alcohol status.   Refused HIV. Check PSA.today  Uptodate with colonoscopy.. scheduled 07/2023 Hep C done  Vaccine: uptodate with TDap . Recommended COVID vaccine and flu vaccine.. pt refused. Uptodate with shingrix.  Problem List Items Addressed This Visit     Essential hypertension, benign (Chronic)   Stable, chronic.  Continue current medication.   Good control on amlodipine 10 mg daily, benazepril 40 mg daily, metoprolol 50 mg daily      Hyperlipidemia (Chronic)    Due for re-eval.  on crestor 10 mg daily. Encouraged exercise, weight loss, healthy eating habits.       Relevant Orders   Comprehensive metabolic panel    Lipid panel   Prediabetes    Due for re-eval.      Relevant Orders   Hemoglobin A1c   Other Visit Diagnoses       Routine general medical examination at a health care facility    -  Primary     Prostate cancer screening       Relevant Orders   PSA, Medicare       Kerby Nora, MD

## 2023-07-01 NOTE — Assessment & Plan Note (Addendum)
Due for re-eval. 

## 2023-07-02 ENCOUNTER — Ambulatory Visit
Admission: RE | Admit: 2023-07-02 | Discharge: 2023-07-02 | Disposition: A | Discharge status: Home / Self Care | Payer: Medicare Other | Source: Ambulatory Visit | Attending: Unknown Physician Specialty | Admitting: Unknown Physician Specialty

## 2023-07-02 ENCOUNTER — Other Ambulatory Visit: Payer: Self-pay | Admitting: Family Medicine

## 2023-07-02 DIAGNOSIS — E871 Hypo-osmolality and hyponatremia: Secondary | ICD-10-CM

## 2023-07-02 DIAGNOSIS — D37030 Neoplasm of uncertain behavior of the parotid salivary glands: Secondary | ICD-10-CM

## 2023-07-02 DIAGNOSIS — E875 Hyperkalemia: Secondary | ICD-10-CM

## 2023-07-02 MED ORDER — GADOPICLENOL 0.5 MMOL/ML IV SOLN
10.0000 mL | Freq: Once | INTRAVENOUS | Status: AC | PRN
  Administered 2023-07-02: 8 mL via INTRAVENOUS

## 2023-07-02 MED ORDER — FUROSEMIDE 20 MG PO TABS: 20.0000 mg | ORAL_TABLET | Freq: Every day | ORAL | 0 refills | Status: DC

## 2023-07-02 NOTE — Addendum Note (Signed)
Addended by: Damita Lack on: 07/02/2023 11:20 AM   Modules accepted: Orders

## 2023-07-10 ENCOUNTER — Encounter: Payer: Self-pay | Admitting: Family Medicine

## 2023-07-10 ENCOUNTER — Other Ambulatory Visit (INDEPENDENT_AMBULATORY_CARE_PROVIDER_SITE_OTHER): Payer: Medicare Other

## 2023-07-10 DIAGNOSIS — E875 Hyperkalemia: Secondary | ICD-10-CM

## 2023-07-10 DIAGNOSIS — E871 Hypo-osmolality and hyponatremia: Secondary | ICD-10-CM

## 2023-07-10 LAB — COMPREHENSIVE METABOLIC PANEL
ALT: 55 U/L — ABNORMAL HIGH (ref 0–53)
AST: 39 U/L — ABNORMAL HIGH (ref 0–37)
Albumin: 4.5 g/dL (ref 3.5–5.2)
Alkaline Phosphatase: 45 U/L (ref 39–117)
BUN: 13 mg/dL (ref 6–23)
CO2: 27 meq/L (ref 19–32)
Calcium: 9.8 mg/dL (ref 8.4–10.5)
Chloride: 99 meq/L (ref 96–112)
Creatinine, Ser: 1.17 mg/dL (ref 0.40–1.50)
GFR: 66.52 mL/min (ref >60.00)
Glucose, Bld: 98 mg/dL (ref 70–99)
Potassium: 4.4 meq/L (ref 3.5–5.1)
Sodium: 135 meq/L (ref 135–145)
Total Bilirubin: 0.8 mg/dL (ref 0.2–1.2)
Total Protein: 7 g/dL (ref 6.0–8.3)

## 2023-07-14 ENCOUNTER — Ambulatory Visit (INDEPENDENT_AMBULATORY_CARE_PROVIDER_SITE_OTHER): Payer: Medicare Other

## 2023-07-14 VITALS — Ht 67.0 in | Wt 177.0 lb

## 2023-07-14 DIAGNOSIS — Z0000 Encounter for general adult medical examination without abnormal findings: Secondary | ICD-10-CM

## 2023-07-14 NOTE — Patient Instructions (Signed)
Devin Stanton , Thank you for taking time to come for your Medicare Wellness Visit. I appreciate your ongoing commitment to your health goals. Please review the following plan we discussed and let me know if I can assist you in the future.   Referrals/Orders/Follow-Ups/Clinician Recommendations: none  This is a list of the screening recommended for you and due dates:  Health Maintenance  Topic Date Due   HIV Screening  Never done   Colon Cancer Screening  05/07/2021   COVID-19 Vaccine (1 - 2024-25 season) 07/17/2023*   Flu Shot  09/08/2023*   Medicare Annual Wellness Visit  07/13/2024   DTaP/Tdap/Td vaccine (3 - Td or Tdap) 05/23/2027   Hepatitis C Screening  Completed   Zoster (Shingles) Vaccine  Completed   HPV Vaccine  Aged Out  *Topic was postponed. The date shown is not the original due date.    Advanced directives: (Copy Requested) Please bring a copy of your health care power of attorney and living will to the office to be added to your chart at your convenience.  Next Medicare Annual Wellness Visit scheduled for next year: Yes 07/14/2024 @ 8:10am televisit

## 2023-07-14 NOTE — Progress Notes (Signed)
Subjective:   Devin Stanton is a 63 y.o. male who presents for Medicare Annual/Subsequent preventive examination.  Visit Complete: Virtual I connected with  Sharlot Gowda on 07/14/23 by a audio enabled telemedicine application and verified that I am speaking with the correct person using two identifiers.  Patient Location: Home  Provider Location: Home Office  I discussed the limitations of evaluation and management by telemedicine. The patient expressed understanding and agreed to proceed.  Vital Signs: Because this visit was a virtual/telehealth visit, some criteria may be missing or patient reported. Any vitals not documented were not able to be obtained and vitals that have been documented are patient reported.  Patient Medicare AWV questionnaire was completed by the patient on (not done); I have confirmed that all information answered by patient is correct and no changes since this date.  Cardiac Risk Factors include: advanced age (>60men, >44 women);dyslipidemia;hypertension;male gender    Objective:    Today's Vitals   07/14/23 0814  Weight: 177 lb (80.3 kg)  Height: 5\' 7"  (1.702 m)   Body mass index is 27.72 kg/m.     07/14/2023    8:26 AM 07/10/2022    8:50 AM 07/12/2021    2:25 PM 07/03/2021    8:24 AM 01/02/2020   11:17 AM 04/08/2016    4:40 PM 04/08/2016   11:27 AM  Advanced Directives  Does Patient Have a Medical Advance Directive? Yes No No No No No No  Type of Estate agent of [place];Living will        Copy of Healthcare Power of Attorney in Chart? Yes - validated most recent copy scanned in chart (See row information)        Would patient like information on creating a medical advance directive?  No - Patient declined No - Patient declined No - Patient declined No - Patient declined No - patient declined information     Current Medications (verified) Outpatient Encounter Medications as of 07/14/2023  Medication Sig   amLODipine  (NORVASC) 10 MG tablet Take 1 tablet (10 mg total) by mouth daily.   aspirin 81 MG tablet Take 81 mg by mouth daily.   benazepril (LOTENSIN) 40 MG tablet Take 1 tablet (40 mg total) by mouth daily.   calcitonin, salmon, (MIACALCIN/FORTICAL) 200 UNIT/ACT nasal spray Place 1 spray into alternate nostrils daily.   clobetasol cream (TEMOVATE) 0.05 % Apply 1 application topically 2 (two) times daily as needed (irritation).   furosemide (LASIX) 20 MG tablet Take 1 tablet (20 mg total) by mouth daily. X 2 days only   meloxicam (MOBIC) 15 MG tablet Take 1 tablet by mouth daily.   metoprolol succinate (TOPROL-XL) 50 MG 24 hr tablet Take 1 tablet (50 mg total) by mouth daily. Take with or immediately following a meal.   rOPINIRole (REQUIP) 0.5 MG tablet Take 1 tablet (0.5 mg total) by mouth at bedtime.   rosuvastatin (CRESTOR) 10 MG tablet Take 1 tablet (10 mg total) by mouth daily.   tadalafil (CIALIS) 5 MG tablet Take 1 tablet (5 mg total) by mouth daily.   No facility-administered encounter medications on file as of 07/14/2023.   Allergies (verified) Patient has no known allergies.   History: Past Medical History:  Diagnosis Date   Backache, unspecified    COVID    ED (erectile dysfunction)    History of kidney stones 1995   "passed them" (04/08/2016)   Hyperlipidemia    controlled medically   Osteoporosis 03 2024  Fractured vertebrae   Unspecified essential hypertension    controlled   Unspecified personal history presenting hazards to health    Past Surgical History:  Procedure Laterality Date   ANTERIOR CERVICAL DECOMP/DISCECTOMY FUSION  06/2015   "C3-4-5"   BACK SURGERY     CARDIAC CATHETERIZATION N/A 04/09/2016   Procedure: Left Heart Cath and Coronary Angiography;  Surgeon: Marykay Lex, MD;  Location: [facility] INVASIVE CV LAB;  Service: Cardiovascular;  Laterality: N/A;   CYST EXCISION Left ~ 2013   "mucoid cyst; middle finger"   FRACTURE SURGERY  May 2024   kyphoplasty L4    IR KYPHO LUMBAR INC FX REDUCE BONE BX UNI/BIL CANNULATION INC/IMAGING  10/08/2022   IR RADIOLOGIST EVAL & MGMT  10/01/2022   IR RADIOLOGIST EVAL & MGMT  10/25/2022   PERIPHERAL VASCULAR CATHETERIZATION N/A 04/09/2016   Procedure: Abdominal Aortogram;  Surgeon: Marykay Lex, MD;  Location: [facility] INVASIVE CV LAB;  Service: Cardiovascular;  Laterality: N/A;   SHOULDER ARTHROSCOPY WITH BICEPS TENDON REPAIR Left 07/12/2021   Procedure: Limited arthroscopic debridement, arthroscopic subacromial decompression, and mini-open biceps tenodesis, left shoulder.;  Surgeon: Christena Flake, MD;  Location: [facility] ORS;  Service: Orthopedics;  Laterality: Left;   SPINE SURGERY  Jan 2019   C5-7 ACDF   TONSILLECTOMY     VASECTOMY  ~ 2012   Family History  Problem Relation Age of Onset   Parkinsonism Mother    Macular degeneration Mother    Leukemia Father    Coronary artery disease Father    Heart disease Brother 56       CABG x 5   Heart disease Brother    Stroke Brother 71   Colon cancer Neg Hx    Esophageal cancer Neg Hx    Stomach cancer Neg Hx    Social History   Socioeconomic History   Marital status: Married    Spouse name: Not on file   Number of children: 3   Years of education: 16   Highest education level: Some college, no degree  Occupational History   Occupation: Public house manager: [employer]  Tobacco Use   Smoking status: Former    Types: Cigars   Smokeless tobacco: Current    Types: Snuff  Vaping Use   Vaping status: Never Used  Substance and Sexual Activity   Alcohol use: Yes    Alcohol/week: 8.0 standard drinks of alcohol    Types: 8 Glasses of wine per week   Drug use: No   Sexual activity: Yes    Partners: Female  Other Topics Concern   Not on file  Social History Narrative   [place] / 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 2nd marriage- '06-08, 3rd marriage 12/10   1 son- '91,step son '91, step daughter '93.    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   Social Drivers of Corporate investment banker Strain: Low Risk  (07/14/2023)   Overall Financial Resource Strain (CARDIA)    Difficulty of Paying Living Expenses: Not hard at all  Food Insecurity: No Food Insecurity (07/14/2023)   Hunger Vital Sign    Worried About Running Out of Food in the Last Year: Never true    Ran Out of Food in the Last Year: Never true  Transportation Needs: No Transportation Needs (07/14/2023)   PRAPARE - Administrator, Civil Service (Medical): No  Lack of Transportation (Non-Medical): No  Physical Activity: Sufficiently Active (07/14/2023)   Exercise Vital Sign    Days of Exercise per Week: 3 days    Minutes of Exercise per Session: 60 min  Stress: No Stress Concern Present (07/14/2023)   [employer] of Occupational Health - Occupational Stress Questionnaire    Feeling of Stress : Only a little  Social Connections: Socially Integrated (07/14/2023)   Social Connection and Isolation Panel [NHANES]    Frequency of Communication with Friends and Family: More than three times a week    Frequency of Social Gatherings with Friends and Family: Once a week    Attends Religious Services: More than 4 times per year    Active Member of [employer] or Organizations: Yes    Attends Engineer, structural: More than 4 times per year    Marital Status: Married    Tobacco Counseling Ready to quit: Not Answered Counseling given: Not Answered  Clinical Intake:  Pre-visit preparation completed: No  Pain : No/denies pain   BMI - recorded: 27.72 Nutritional Status: BMI 25 -29 Overweight Nutritional Risks: None Diabetes: No  How often do you need to have someone help you when you read instructions, pamphlets, or other written materials from your doctor or pharmacy?: 1 - Never  Interpreter Needed?: No  Comments: lives with wife Information entered by :: B.Pollyanna Levay,LPN   Activities of  Daily Living    07/14/2023    8:27 AM  In your present state of health, do you have any difficulty performing the following activities:  Hearing? 0  Vision? 0  Difficulty concentrating or making decisions? 0  Walking or climbing stairs? 0  Dressing or bathing? 0  Doing errands, shopping? 0  Preparing Food and eating ? N  Using the Toilet? N  In the past six months, have you accidently leaked urine? N  Do you have problems with loss of bowel control? N  Managing your Medications? N  Managing your Finances? N  Housekeeping or managing your Housekeeping? N    Patient Care Team: Excell Seltzer, MD as PCP - General (Family Medicine)  Indicate any recent Medical Services you may have received from other than [facility] providers in the past year (date may be approximate).     Assessment:   This is a routine wellness examination for Bandon.  Hearing/Vision screen Hearing Screening - Comments:: Pt says has had hearing problems for decades: sufficient with hearing aids Vision Screening - Comments:: Pt says he has pretty good hearing w/glasses Dr Lorin Picket   Goals Addressed             This Visit's Progress    COMPLETED: DIET - EAT MORE FRUITS AND VEGETABLES   On track    Patient Stated       Pt says he wants to lose 20lbs by changing his diet.       Depression Screen    07/14/2023    8:20 AM 07/01/2023    9:18 AM 07/10/2022    8:49 AM 06/27/2022   10:40 AM 06/26/2021    2:53 PM 05/23/2020   12:32 PM 04/23/2019   12:14 PM  PHQ 2/9 Scores  PHQ - 2 Score 0 0 0 0 0 0 0  PHQ- 9 Score   0        Fall Risk    07/14/2023    8:16 AM 07/01/2023    9:18 AM 07/10/2022    8:51 AM 06/27/2022   10:40 AM  06/26/2021    3:45 PM  Fall Risk   Falls in the past year? 0 0 0 0 0  Number falls in past yr: 1 0 0 0   Injury with Fall? 0 0 0 0   Risk for fall due to : No Fall Risks No Fall Risks No Fall Risks No Fall Risks   Follow up Education provided;Falls prevention discussed Falls evaluation  completed Falls prevention discussed;Falls evaluation completed Falls evaluation completed     MEDICARE RISK AT HOME: Medicare Risk at Home Any stairs in or around the home?: Yes If so, are there any without handrails?: Yes Home free of loose throw rugs in walkways, pet beds, electrical cords, etc?: Yes Adequate lighting in your home to reduce risk of falls?: Yes Life alert?: No Use of a cane, walker or w/c?: No Grab bars in the bathroom?: No Shower chair or bench in shower?: Yes Elevated toilet seat or a handicapped toilet?: No  TIMED UP AND GO:  Was the test performed?  No    Cognitive Function:        07/14/2023    8:28 AM 07/10/2022    8:55 AM  6CIT Screen  What Year? 0 points 0 points  What month? 0 points 0 points  What time? 0 points 0 points  Count back from 20 0 points 0 points  Months in reverse 0 points 0 points  Repeat phrase 0 points 0 points  Total Score 0 points 0 points   Immunizations Immunization History  Administered Date(s) Administered   Influenza Split 03/02/2012   Influenza Whole 03/11/2008, 04/18/2009, 04/23/2010   Influenza,inj,Quad PF,6+ Mos 05/31/2014, 03/22/2016, 03/26/2017, 04/14/2018, 05/23/2020   Influenza-Unspecified 03/07/2015, 03/25/2016   Td 11/14/2006   Tdap 05/22/2017   Zoster Recombinant(Shingrix) 05/23/2020, 09/15/2020    TDAP status: Up to date  Flu Vaccine status: Declined, Education has been provided regarding the importance of this vaccine but patient still declined. Advised may receive this vaccine at local pharmacy or Health Dept. Aware to provide a copy of the vaccination record if obtained from local pharmacy or Health Dept. Verbalized acceptance and understanding.   Covid-19 vaccine status: Declined, Education has been provided regarding the importance of this vaccine but patient still declined. Advised may receive this vaccine at local pharmacy or Health Dept.or vaccine clinic. Aware to provide a copy of the vaccination  record if obtained from local pharmacy or Health Dept. Verbalized acceptance and understanding.  Qualifies for Shingles Vaccine? Yes   Zostavax completed Yes   Shingrix Completed?: Yes  Screening Tests Health Maintenance  Topic Date Due   HIV Screening  Never done   Colonoscopy  05/07/2021   COVID-19 Vaccine (1 - 2024-25 season) 07/17/2023 (Originally 02/09/2023)   INFLUENZA VACCINE  09/08/2023 (Originally 01/09/2023)   Medicare Annual Wellness (AWV)  07/13/2024   DTaP/Tdap/Td (3 - Td or Tdap) 05/23/2027   Hepatitis C Screening  Completed   Zoster Vaccines- Shingrix  Completed   HPV VACCINES  Aged Out    Health Maintenance  Health Maintenance Due  Topic Date Due   HIV Screening  Never done   Colonoscopy  05/07/2021    Colorectal cancer screening: Type of screening: Colonoscopy. Completed yes. Repeat every 10 years scheduled for 07/23/2023  Lung Cancer Screening: (Low Dose CT Chest recommended if Age 21-80 years, 20 pack-year currently smoking OR have quit w/in 15years.) does not qualify.   Lung Cancer Screening Referral: no  Additional Screening:  Hepatitis C Screening: does not qualify;  Completed 09/13/2014  Vision Screening: Recommended annual ophthalmology exams for early detection of glaucoma and other disorders of the eye. Is the patient up to date with their annual eye exam?  Yes  Who is the provider or what is the name of the office in which the patient attends annual eye exams? Dr Fredrich Birks If pt is not established with a provider, would they like to be referred to a provider to establish care? No .   Dental Screening: Recommended annual dental exams for proper oral hygiene  Diabetic Foot Exam: n/a  Community Resource Referral / Chronic Care Management: CRR required this visit?  No   CCM required this visit?  No    Plan:     I have personally reviewed and noted the following in the patient's chart:   Medical and social history Use of alcohol, tobacco or  illicit drugs  Current medications and supplements including opioid prescriptions. Patient is not currently taking opioid prescriptions. Functional ability and status Nutritional status Physical activity Advanced directives List of other physicians Hospitalizations, surgeries, and ER visits in previous 12 months Vitals Screenings to include cognitive, depression, and falls Referrals and appointments  In addition, I have reviewed and discussed with patient certain preventive protocols, quality metrics, and best practice recommendations. A written personalized care plan for preventive services as well as general preventive health recommendations were provided to patient.    Sue Lush, LPN   10/12/979   After Visit Summary: (MyChart) Due to this being a telephonic visit, the after visit summary with patients personalized plan was offered to patient via MyChart   Nurse Notes: The patient states he is doing well and has no concerns or questions at this time.

## 2023-07-16 ENCOUNTER — Encounter: Payer: Self-pay | Admitting: Gastroenterology

## 2023-07-23 ENCOUNTER — Ambulatory Visit: Payer: Medicare Other | Admitting: Anesthesiology

## 2023-07-23 ENCOUNTER — Other Ambulatory Visit: Payer: Self-pay

## 2023-07-23 ENCOUNTER — Encounter
Admission: RE | Disposition: A | Discharge status: Home / Self Care | Payer: Self-pay | Source: Home / Self Care | Attending: Gastroenterology

## 2023-07-23 ENCOUNTER — Encounter: Payer: Self-pay | Admitting: Gastroenterology

## 2023-07-23 ENCOUNTER — Ambulatory Visit
Admission: RE | Admit: 2023-07-23 | Discharge: 2023-07-23 | Disposition: A | Discharge status: Home / Self Care | Payer: Medicare Other | Attending: Gastroenterology | Admitting: Gastroenterology

## 2023-07-23 DIAGNOSIS — D122 Benign neoplasm of ascending colon: Secondary | ICD-10-CM | POA: Insufficient documentation

## 2023-07-23 DIAGNOSIS — Z1211 Encounter for screening for malignant neoplasm of colon: Secondary | ICD-10-CM

## 2023-07-23 DIAGNOSIS — K635 Polyp of colon: Secondary | ICD-10-CM

## 2023-07-23 HISTORY — PX: COLONOSCOPY WITH PROPOFOL: SHX5780

## 2023-07-23 HISTORY — PX: POLYPECTOMY: SHX5525

## 2023-07-23 SURGERY — COLONOSCOPY WITH PROPOFOL
Anesthesia: General

## 2023-07-23 MED ORDER — SODIUM CHLORIDE 0.9 % IV SOLN: INTRAVENOUS | Status: DC

## 2023-07-23 MED ORDER — PROPOFOL 500 MG/50ML IV EMUL
INTRAVENOUS | Status: DC | PRN
  Administered 2023-07-23: 75 ug/kg/min via INTRAVENOUS

## 2023-07-23 MED ORDER — DEXMEDETOMIDINE HCL IN NACL 80 MCG/20ML IV SOLN
INTRAVENOUS | Status: DC | PRN
Start: 2023-07-23 — End: 2023-07-23
  Administered 2023-07-23: 20 ug via INTRAVENOUS

## 2023-07-23 MED ORDER — LIDOCAINE HCL (CARDIAC) PF 100 MG/5ML IV SOSY
PREFILLED_SYRINGE | INTRAVENOUS | Status: DC | PRN
  Administered 2023-07-23: 60 mg via INTRAVENOUS

## 2023-07-23 MED ORDER — PROPOFOL 10 MG/ML IV BOLUS
INTRAVENOUS | Status: DC | PRN
  Administered 2023-07-23 (×2): 50 mg via INTRAVENOUS

## 2023-07-23 NOTE — H&P (Signed)
Arlyss Repress, MD [address]  [address]  [place], [place] [address]  Main: [phone]  Fax: [phone] Pager: [phone]  Primary Care Physician:  Excell Seltzer, MD Primary Gastroenterologist:  Dr. Arlyss Repress  Pre-Procedure History & Physical: HPI:  Devin Stanton is a 63 y.o. male is here for an colonoscopy.   Past Medical History:  Diagnosis Date   Backache, unspecified    COVID    ED (erectile dysfunction)    History of kidney stones 1995   "passed them" (04/08/2016)   Hyperlipidemia    controlled medically   Osteoporosis 03 2024   Fractured vertebrae   Unspecified essential hypertension    controlled   Unspecified personal history presenting hazards to health     Past Surgical History:  Procedure Laterality Date   ANTERIOR CERVICAL DECOMP/DISCECTOMY FUSION  06/2015   "C3-4-5"   BACK SURGERY     CARDIAC CATHETERIZATION N/A 04/09/2016   Procedure: Left Heart Cath and Coronary Angiography;  Surgeon: Marykay Lex, MD;  Location: [facility] INVASIVE CV LAB;  Service: Cardiovascular;  Laterality: N/A;   CYST EXCISION Left ~ 2013   "mucoid cyst; middle finger"   FRACTURE SURGERY  May 2024   kyphoplasty L4   IR KYPHO LUMBAR INC FX REDUCE BONE BX UNI/BIL CANNULATION INC/IMAGING  10/08/2022   IR RADIOLOGIST EVAL & MGMT  10/01/2022   IR RADIOLOGIST EVAL & MGMT  10/25/2022   PERIPHERAL VASCULAR CATHETERIZATION N/A 04/09/2016   Procedure: Abdominal Aortogram;  Surgeon: Marykay Lex, MD;  Location: [facility] INVASIVE CV LAB;  Service: Cardiovascular;  Laterality: N/A;   SHOULDER ARTHROSCOPY WITH BICEPS TENDON REPAIR Left 07/12/2021   Procedure: Limited arthroscopic debridement, arthroscopic subacromial decompression, and mini-open biceps tenodesis, left shoulder.;  Surgeon: Christena Flake, MD;  Location: [facility] ORS;  Service: Orthopedics;  Laterality: Left;   SPINE SURGERY  Jan 2019   C5-7 ACDF   TONSILLECTOMY     VASECTOMY  ~ 2012    Prior to Admission  medications   Medication Sig Start Date End Date Taking? Authorizing Provider  aspirin 81 MG tablet Take 81 mg by mouth daily.   Yes [provider]  clobetasol cream (TEMOVATE) 0.05 % Apply 1 application topically 2 (two) times daily as needed (irritation).   Yes [provider]  amLODipine (NORVASC) 10 MG tablet Take 1 tablet (10 mg total) by mouth daily. 05/12/23   Bedsole, Amy E, MD  benazepril (LOTENSIN) 40 MG tablet Take 1 tablet (40 mg total) by mouth daily. 05/12/23   Bedsole, Amy E, MD  calcitonin, salmon, (MIACALCIN/FORTICAL) 200 UNIT/ACT nasal spray Place 1 spray into alternate nostrils daily. Patient not taking: Reported on 07/23/2023 05/09/23   [provider]  furosemide (LASIX) 20 MG tablet Take 1 tablet (20 mg total) by mouth daily. X 2 days only Patient not taking: Reported on 07/23/2023 07/02/23   Excell Seltzer, MD  meloxicam (MOBIC) 15 MG tablet Take 1 tablet by mouth daily. 06/04/22   [provider]  metoprolol succinate (TOPROL-XL) 50 MG 24 hr tablet Take 1 tablet (50 mg total) by mouth daily. Take with or immediately following a meal. 05/12/23   Bedsole, Amy E, MD  rOPINIRole (REQUIP) 0.5 MG tablet Take 1 tablet (0.5 mg total) by mouth at bedtime. 05/12/23   Bedsole, Amy E, MD  rosuvastatin (CRESTOR) 10 MG tablet Take 1 tablet (10 mg total) by mouth daily. 05/12/23   Excell Seltzer, MD  tadalafil (CIALIS)  5 MG tablet Take 1 tablet (5 mg total) by mouth daily. 05/12/23   Excell Seltzer, MD    Allergies as of 06/17/2023   (No Known Allergies)    Family History  Problem Relation Age of Onset   Parkinsonism Mother    Macular degeneration Mother    Leukemia Father    Coronary artery disease Father    Heart disease Brother 70       CABG x 5   Heart disease Brother    Stroke Brother 49   Colon cancer Neg Hx    Esophageal cancer Neg Hx    Stomach cancer Neg Hx     Social History   Socioeconomic History   Marital status: Married     Spouse name: Not on file   Number of children: 3   Years of education: 16   Highest education level: Some college, no degree  Occupational History   Occupation: Public house manager: [employer]  Tobacco Use   Smoking status: Former    Types: Cigars   Smokeless tobacco: Current    Types: Snuff  Vaping Use   Vaping status: Never Used  Substance and Sexual Activity   Alcohol use: Yes    Alcohol/week: 8.0 standard drinks of alcohol    Types: 8 Glasses of wine per week   Drug use: No   Sexual activity: Yes    Partners: Female  Other Topics Concern   Not on file  Social History Narrative   [place] / 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 2nd marriage- '06-08, 3rd marriage 12/10   1 son- '91,step son '91, step daughter '93.    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   Social Drivers of Corporate investment banker Strain: Low Risk  (07/14/2023)   Overall Financial Resource Strain (CARDIA)    Difficulty of Paying Living Expenses: Not hard at all  Food Insecurity: No Food Insecurity (07/14/2023)   Hunger Vital Sign    Worried About Running Out of Food in the Last Year: Never true    Ran Out of Food in the Last Year: Never true  Transportation Needs: No Transportation Needs (07/14/2023)   PRAPARE - Administrator, Civil Service (Medical): No    Lack of Transportation (Non-Medical): No  Physical Activity: Sufficiently Active (07/14/2023)   Exercise Vital Sign    Days of Exercise per Week: 3 days    Minutes of Exercise per Session: 60 min  Stress: No Stress Concern Present (07/14/2023)   [employer] of Occupational Health - Occupational Stress Questionnaire    Feeling of Stress : Only a little  Social Connections: Socially Integrated (07/14/2023)   Social Connection and Isolation Panel [NHANES]    Frequency of Communication with Friends and Family: More than three times a week    Frequency of Social  Gatherings with Friends and Family: Once a week    Attends Religious Services: More than 4 times per year    Active Member of [employer] or Organizations: Yes    Attends Banker Meetings: More than 4 times per year    Marital Status: Married  Catering manager Violence: Not At Risk (07/14/2023)   Humiliation, Afraid, Rape, and Kick questionnaire    Fear of Current or Ex-Partner: No    Emotionally Abused: No    Physically Abused: No    Sexually Abused: No  Review of Systems: See HPI, otherwise negative ROS  Physical Exam: BP (!) 161/89   Pulse (!) 102   Temp (!) 96.7 F (35.9 C) (Temporal)   Resp 16   Ht 5\' 7"  (1.702 m)   Wt 81.6 kg   SpO2 99%   BMI 28.16 kg/m  General:   Alert,  pleasant and cooperative in NAD Head:  Normocephalic and atraumatic. Neck:  Supple; no masses or thyromegaly. Lungs:  Clear throughout to auscultation.    Heart:  Regular rate and rhythm. Abdomen:  Soft, nontender and nondistended. Normal bowel sounds, without guarding, and without rebound.   Neurologic:  Alert and  oriented x4;  grossly normal neurologically.  Impression/Plan: Devin Stanton is here for an colonoscopy to be performed for colon cancer screening  Risks, benefits, limitations, and alternatives regarding  colonoscopy have been reviewed with the patient.  Questions have been answered.  All parties agreeable.   Lannette Donath, MD  07/23/2023, 8:18 AM

## 2023-07-23 NOTE — Transfer of Care (Signed)
Immediate Anesthesia Transfer of Care Note  Patient: Devin Stanton  Procedure(s) Performed: COLONOSCOPY WITH PROPOFOL POLYPECTOMY  Patient Location: PACU  Anesthesia Type:General  Level of Consciousness: sedated  Airway & Oxygen Therapy: Patient Spontanous Breathing  Post-op Assessment: Report given to RN and Post -op Vital signs reviewed and stable  Post vital signs: Reviewed and stable  Last Vitals:  Vitals Value Taken Time  BP 107/72 07/23/23 0854  Temp    Pulse 81 07/23/23 0854  Resp 19 07/23/23 0854  SpO2 97 % 07/23/23 0854    Last Pain:  Vitals:   07/23/23 0854  TempSrc:   PainSc: 0-No pain         Complications: No notable events documented.

## 2023-07-23 NOTE — Op Note (Signed)
[facility] Gastroenterology Patient Name: Devin Stanton Procedure Date: 07/23/2023 8:23 AM MRN: [id number] Account #: [id number] Date of Birth: 1960-07-03 Admit Type: Outpatient Age: 63 Room: [facility] ENDO ROOM 4 Gender: Male Note Status: Finalized Instrument Name: Nelda Marseille [id number] Procedure:             Colonoscopy Indications:           Screening for colorectal malignant neoplasm Providers:             Toney Reil MD, MD Referring MD:          Excell Seltzer MD, MD (Referring MD) Medicines:             General Anesthesia Complications:         No immediate complications. Estimated blood loss: None. Procedure:             Pre-Anesthesia Assessment:                        - Prior to the procedure, a History and Physical was                         performed, and patient medications and allergies were                         reviewed. The patient is competent. The risks and                         benefits of the procedure and the sedation options and                         risks were discussed with the patient. All questions                         were answered and informed consent was obtained.                         Patient identification and proposed procedure were                         verified by the physician, the nurse, the                         anesthesiologist, the anesthetist and the technician                         in the pre-procedure area in the procedure room in the                         endoscopy suite. Mental Status Examination: alert and                         oriented. Airway Examination: normal oropharyngeal                         airway and neck mobility. Respiratory Examination:                         clear to auscultation. CV Examination: normal.  Prophylactic Antibiotics: The patient does not require                         prophylactic antibiotics. Prior Anticoagulants: The                          patient has taken no anticoagulant or antiplatelet                         agents. ASA Grade Assessment: II - A patient with mild                         systemic disease. After reviewing the risks and                         benefits, the patient was deemed in satisfactory                         condition to undergo the procedure. The anesthesia                         plan was to use general anesthesia. Immediately prior                         to administration of medications, the patient was                         re-assessed for adequacy to receive sedatives. The                         heart rate, respiratory rate, oxygen saturations,                         blood pressure, adequacy of pulmonary ventilation, and                         response to care were monitored throughout the                         procedure. The physical status of the patient was                         re-assessed after the procedure.                        After obtaining informed consent, the colonoscope was                         passed under direct vision. Throughout the procedure,                         the patient's blood pressure, pulse, and oxygen                         saturations were monitored continuously. The                         Colonoscope was introduced through the anus and  advanced to the the cecum, identified by appendiceal                         orifice and ileocecal valve. The colonoscopy was                         performed without difficulty. The patient tolerated                         the procedure well. The quality of the bowel                         preparation was evaluated using the BBPS [facility] Bowel                         Preparation Scale) with scores of: Right Colon = 3,                         Transverse Colon = 3 and Left Colon = 3 (entire mucosa                         seen well with no residual staining, small fragments                          of stool or opaque liquid). The total BBPS score                         equals 9. The ileocecal valve, appendiceal orifice,                         and rectum were photographed. Findings:      The perianal and digital rectal examinations were normal. Pertinent       negatives include normal sphincter tone and no palpable rectal lesions.      A diminutive polyp was found in the ascending colon. The polyp was       sessile. The polyp was removed with a jumbo cold forceps. Resection and       retrieval were complete.      A 5 mm polyp was found in the proximal ascending colon. The polyp was       sessile. The polyp was removed with a cold snare. Resection and       retrieval were complete. Estimated blood loss: none.      The retroflexed view of the distal rectum and anal verge was normal and       showed no anal or rectal abnormalities.      The exam was otherwise without abnormality. Impression:            - One diminutive polyp in the ascending colon, removed                         with a jumbo cold forceps. Resected and retrieved.                        - One 5 mm polyp in the proximal ascending colon,  removed with a cold snare. Resected and retrieved.                        - The distal rectum and anal verge are normal on                         retroflexion view.                        - The examination was otherwise normal. Recommendation:        - Discharge patient to home (with escort).                        - Resume previous diet today.                        - Continue present medications.                        - Await pathology results.                        - Repeat colonoscopy in 5 to 7 years for surveillance                         based on pathology results. Procedure Code(s):     --- Professional ---                        [phone], Colonoscopy, flexible; with removal of                         tumor(s), polyp(s), or other lesion(s) by snare                          technique                        45380, 59, Colonoscopy, flexible; with biopsy, single                         or multiple Diagnosis Code(s):     --- Professional ---                        Z12.11, Encounter for screening for malignant neoplasm                         of colon                        D12.2, Benign neoplasm of ascending colon CPT copyright 2022 American Medical Association. All rights reserved. The codes documented in this report are preliminary and upon coder review may  be revised to meet current compliance requirements. Dr. Libby Maw Toney Reil MD, MD 07/23/2023 8:51:41 AM This report has been signed electronically. Number of Addenda: 0 Note Initiated On: 07/23/2023 8:23 AM Scope Withdrawal Time: 0 hours 9 minutes 36 seconds  Total Procedure Duration: 0 hours 16 minutes 58 seconds  Estimated Blood Loss:  Estimated blood loss: none.      [facility]

## 2023-07-23 NOTE — Anesthesia Postprocedure Evaluation (Signed)
Anesthesia Post Note  Patient: Devin Stanton  Procedure(s) Performed: COLONOSCOPY WITH PROPOFOL POLYPECTOMY  Patient location during evaluation: Endoscopy Anesthesia Type: General Level of consciousness: awake and alert Pain management: pain level controlled Vital Signs Assessment: post-procedure vital signs reviewed and stable Respiratory status: spontaneous breathing, nonlabored ventilation, respiratory function stable and patient connected to nasal cannula oxygen Cardiovascular status: blood pressure returned to baseline and stable Postop Assessment: no apparent nausea or vomiting Anesthetic complications: no   No notable events documented.   Last Vitals:  Vitals:   07/23/23 0854 07/23/23 0904  BP: 107/72 104/70  Pulse: 81 77  Resp: 19 18  Temp:    SpO2: 97% 95%    Last Pain:  Vitals:   07/23/23 0904  TempSrc:   PainSc: 0-No pain                 Corinda Gubler

## 2023-07-23 NOTE — Anesthesia Preprocedure Evaluation (Signed)
Anesthesia Evaluation  Patient identified by MRN, date of birth, ID band Patient awake    Reviewed: Allergy & Precautions, NPO status , Patient's Chart, lab work & pertinent test results  History of Anesthesia Complications Negative for: history of anesthetic complications  Airway Mallampati: II  TM Distance: >3 FB Neck ROM: Full    Dental no notable dental hx. (+) Teeth Intact   Pulmonary neg pulmonary ROS, neg sleep apnea, neg COPD, Patient abstained from smoking.Not current smoker, former smoker   Pulmonary exam normal breath sounds clear to auscultation       Cardiovascular Exercise Tolerance: Good METShypertension, Pt. on medications (-) CAD and (-) Past MI (-) dysrhythmias  Rhythm:Regular Rate:Normal - Systolic murmurs    Neuro/Psych negative neurological ROS  negative psych ROS   GI/Hepatic ,neg GERD  ,,(+)     (-) substance abuse    Endo/Other  neg diabetes    Renal/GU negative Renal ROS     Musculoskeletal   Abdominal   Peds  Hematology   Anesthesia Other Findings Past Medical History: No date: Backache, unspecified No date: COVID No date: ED (erectile dysfunction) 1995: History of kidney stones     Comment:  "passed them" (04/08/2016) No date: Hyperlipidemia     Comment:  controlled medically 03 2024: Osteoporosis     Comment:  Fractured vertebrae No date: Unspecified essential hypertension     Comment:  controlled No date: Unspecified personal history presenting hazards to health  Reproductive/Obstetrics                             Anesthesia Physical Anesthesia Plan  ASA: 2  Anesthesia Plan: General   Post-op Pain Management: Minimal or no pain anticipated   Induction: Intravenous  PONV Risk Score and Plan: 2 and Propofol infusion, TIVA and Ondansetron  Airway Management Planned: Nasal Cannula  Additional Equipment: None  Intra-op Plan:    Post-operative Plan:   Informed Consent: I have reviewed the patients History and Physical, chart, labs and discussed the procedure including the risks, benefits and alternatives for the proposed anesthesia with the patient or authorized representative who has indicated his/her understanding and acceptance.     Dental advisory given  Plan Discussed with: CRNA and Surgeon  Anesthesia Plan Comments: (Discussed risks of anesthesia with patient, including possibility of difficulty with spontaneous ventilation under anesthesia necessitating airway intervention, PONV, and rare risks such as cardiac or respiratory or neurological events, and allergic reactions. Discussed the role of CRNA in patient's perioperative care. Patient understands.)       Anesthesia Quick Evaluation

## 2023-07-24 ENCOUNTER — Encounter: Payer: Self-pay | Admitting: Gastroenterology

## 2023-07-24 LAB — SURGICAL PATHOLOGY

## 2023-07-25 ENCOUNTER — Other Ambulatory Visit: Payer: Self-pay | Admitting: Family Medicine

## 2023-07-27 ENCOUNTER — Other Ambulatory Visit: Payer: Self-pay | Admitting: Family Medicine

## 2023-09-25 ENCOUNTER — Other Ambulatory Visit: Payer: Self-pay | Admitting: Family Medicine

## 2023-11-27 ENCOUNTER — Other Ambulatory Visit: Payer: Self-pay | Admitting: Family Medicine

## 2024-05-20 ENCOUNTER — Other Ambulatory Visit: Payer: Self-pay | Admitting: Medical Genetics

## 2024-05-26 ENCOUNTER — Telehealth: Payer: Self-pay | Admitting: Family Medicine

## 2024-05-26 NOTE — Telephone Encounter (Signed)
 If pt is wanting a physical, needs to reschedule after 06/30/24. Last physical was 07/01/23. If he needs medication refills until then, let us  know and we can fill to hold him until his physical.

## 2024-05-27 ENCOUNTER — Ambulatory Visit: Admitting: Family Medicine

## 2024-06-07 ENCOUNTER — Other Ambulatory Visit
Admission: RE | Admit: 2024-06-07 | Discharge: 2024-06-07 | Disposition: A | Discharge status: Home / Self Care | Payer: Self-pay | Source: Ambulatory Visit | Attending: Medical Genetics | Admitting: Medical Genetics

## 2024-06-14 ENCOUNTER — Other Ambulatory Visit: Payer: Self-pay | Admitting: Unknown Physician Specialty

## 2024-06-14 DIAGNOSIS — D37030 Neoplasm of uncertain behavior of the parotid salivary glands: Secondary | ICD-10-CM

## 2024-06-16 ENCOUNTER — Ambulatory Visit
Admission: RE | Admit: 2024-06-16 | Discharge: 2024-06-16 | Disposition: A | Discharge status: Home / Self Care | Source: Ambulatory Visit | Attending: Unknown Physician Specialty | Admitting: Unknown Physician Specialty

## 2024-06-16 DIAGNOSIS — D37030 Neoplasm of uncertain behavior of the parotid salivary glands: Secondary | ICD-10-CM

## 2024-06-16 MED ORDER — GADOPICLENOL 0.5 MMOL/ML IV SOLN
10.0000 mL | Freq: Once | INTRAVENOUS | Status: AC | PRN
  Administered 2024-06-16: 8 mL via INTRAVENOUS

## 2024-06-23 ENCOUNTER — Ambulatory Visit
Admission: RE | Admit: 2024-06-23 | Discharge: 2024-06-23 | Disposition: A | Discharge status: Home / Self Care | Source: Ambulatory Visit | Attending: Orthopedic Surgery | Admitting: Orthopedic Surgery

## 2024-06-23 ENCOUNTER — Other Ambulatory Visit: Payer: Self-pay | Admitting: Orthopedic Surgery

## 2024-06-23 DIAGNOSIS — M75101 Unspecified rotator cuff tear or rupture of right shoulder, not specified as traumatic: Secondary | ICD-10-CM | POA: Diagnosis present

## 2024-06-23 DIAGNOSIS — S4991XA Unspecified injury of right shoulder and upper arm, initial encounter: Secondary | ICD-10-CM

## 2024-06-23 DIAGNOSIS — R29898 Other symptoms and signs involving the musculoskeletal system: Secondary | ICD-10-CM | POA: Diagnosis present

## 2024-07-02 ENCOUNTER — Ambulatory Visit: Admitting: Family Medicine

## 2024-07-02 ENCOUNTER — Encounter: Payer: Self-pay | Admitting: Family Medicine

## 2024-07-02 ENCOUNTER — Ambulatory Visit: Payer: Self-pay | Admitting: Family Medicine

## 2024-07-02 VITALS — BP 130/80 | HR 66 | Temp 97.7°F | Ht 67.25 in | Wt 173.5 lb

## 2024-07-02 DIAGNOSIS — I10 Essential (primary) hypertension: Secondary | ICD-10-CM | POA: Diagnosis not present

## 2024-07-02 DIAGNOSIS — R7303 Prediabetes: Secondary | ICD-10-CM

## 2024-07-02 DIAGNOSIS — E785 Hyperlipidemia, unspecified: Secondary | ICD-10-CM

## 2024-07-02 DIAGNOSIS — Z23 Encounter for immunization: Secondary | ICD-10-CM | POA: Diagnosis not present

## 2024-07-02 DIAGNOSIS — R7989 Other specified abnormal findings of blood chemistry: Secondary | ICD-10-CM | POA: Insufficient documentation

## 2024-07-02 DIAGNOSIS — Z8249 Family history of ischemic heart disease and other diseases of the circulatory system: Secondary | ICD-10-CM | POA: Diagnosis not present

## 2024-07-02 DIAGNOSIS — Z125 Encounter for screening for malignant neoplasm of prostate: Secondary | ICD-10-CM | POA: Diagnosis not present

## 2024-07-02 LAB — LIPID PANEL
Cholesterol: 183 mg/dL (ref 28–200)
HDL: 90 mg/dL
LDL Cholesterol: 81 mg/dL (ref 10–99)
NonHDL: 93.13
Total CHOL/HDL Ratio: 2
Triglycerides: 60 mg/dL (ref 10.0–149.0)
VLDL: 12 mg/dL (ref 0.0–40.0)

## 2024-07-02 LAB — COMPREHENSIVE METABOLIC PANEL WITH GFR
ALT: 71 U/L — ABNORMAL HIGH (ref 3–53)
AST: 45 U/L — ABNORMAL HIGH (ref 5–37)
Albumin: 4.8 g/dL (ref 3.5–5.2)
Alkaline Phosphatase: 51 U/L (ref 39–117)
BUN: 22 mg/dL (ref 6–23)
CO2: 26 meq/L (ref 19–32)
Calcium: 10 mg/dL (ref 8.4–10.5)
Chloride: 98 meq/L (ref 96–112)
Creatinine, Ser: 1.28 mg/dL (ref 0.40–1.50)
GFR: 59.31 mL/min — ABNORMAL LOW
Glucose, Bld: 104 mg/dL — ABNORMAL HIGH (ref 70–99)
Potassium: 4.5 meq/L (ref 3.5–5.1)
Sodium: 132 meq/L — ABNORMAL LOW (ref 135–145)
Total Bilirubin: 0.6 mg/dL (ref 0.2–1.2)
Total Protein: 7.7 g/dL (ref 6.0–8.3)

## 2024-07-02 LAB — HEMOGLOBIN A1C: Hgb A1c MFr Bld: 5.7 % (ref 4.6–6.5)

## 2024-07-02 LAB — PSA, MEDICARE: PSA: 1.29 ng/mL (ref 0.10–4.00)

## 2024-07-02 NOTE — Assessment & Plan Note (Signed)
 Due for re-eval.

## 2024-07-02 NOTE — Assessment & Plan Note (Signed)
 Followed by cardiology. On statin medication.

## 2024-07-02 NOTE — Addendum Note (Signed)
 Addended by: WENDELL ARLAND RAMAN on: 07/02/2024 09:42 AM   Modules accepted: Orders

## 2024-07-02 NOTE — Assessment & Plan Note (Signed)
Due for re-eval on crestor 10 mg daily. Encouraged exercise, weight loss, healthy eating habits.

## 2024-07-02 NOTE — Progress Notes (Signed)
 "   Patient ID: Devin Stanton, male    DOB: 1961-04-03, 64 y.o.   MRN: [id number]  This visit was conducted in person.  BP 130/80   Pulse 66   Temp 97.7 F (36.5 C) (Temporal)   Ht 5' 7.25 (1.708 m)   Wt 173 lb 8 oz (78.7 kg)   SpO2 99%   BMI 26.97 kg/m    Chief Complaint  Patient presents with   Medical Management of Chronic Issues    M[facility] scheduled 08/03/2024      Subjective:   HPI: Devin Stanton is a 63 y.o. male presenting on 07/02/2024 for    complete physical  The patient presents for  review of chronic health problems. He/She also has the following acute concerns today: none  The patient will see a LPN or RN for medicare wellness visit.  Scheduled 07/14/2024  Prevention and wellness was reviewed in detail.   Flowsheet Row Office Visit from 07/02/2024 in [facility]  PHQ-2 Total Score 0   Hypertension:    Good control on amlodipine  10 mg daily, benazepril  40 mg daily, metoprolol  50 mg daily BP Readings from Last 3 Encounters:  07/02/24 130/80  07/23/23 117/76  07/01/23 110/64  Using medication without problems or lightheadedness:  Chest pain with exertion: none Edema: off and on.. mainly on long trip. Short of breath:none Average home BPs: Other issues:  Elevated Cholesterol: Due for re-eval. Crestor  10 mg daily Lab Results  Component Value Date   CHOL 189 07/01/2023   HDL 84.60 07/01/2023   LDLCALC 91 07/01/2023   LDLDIRECT 167.2 04/14/2009   TRIG 68.0 07/01/2023   CHOLHDL 2 07/01/2023  Using medications without problems: Muscle aches:  Diet compliance: moderate Exercise: very active physical Other complaints:   Prediabetes Due for re-eval.    Wt Readings from Last 3 Encounters:  07/02/24 173 lb 8 oz (78.7 kg)  07/23/23 179 lb 12.8 oz (81.6 kg)  07/14/23 177 lb (80.3 kg)    Relevant past medical, surgical, family and social history reviewed and updated as indicated. Interim medical history since our  last visit reviewed. Allergies and medications reviewed and updated. Outpatient Medications Prior to Visit  Medication Sig Dispense Refill   amLODipine  (NORVASC ) 10 MG tablet TAKE 1 TABLET BY MOUTH EVERY DAY 90 tablet 3   benazepril  (LOTENSIN ) 40 MG tablet TAKE 1 TABLET BY MOUTH EVERY DAY 90 tablet 3   clobetasol cream (TEMOVATE) 0.05 % Apply 1 application topically 2 (two) times daily as needed (irritation).     meloxicam (MOBIC) 15 MG tablet Take 1 tablet by mouth daily.     metoprolol  succinate (TOPROL -XL) 50 MG 24 hr tablet TAKE 1 TABLET BY MOUTH EVERY DAY WITH OR IMMEDIATELY FOLLOWING A MEAL 90 tablet 3   rOPINIRole  (REQUIP ) 0.5 MG tablet TAKE 1 TABLET AT BEDTIME 90 tablet 3   rosuvastatin  (CRESTOR ) 10 MG tablet TAKE 1 TABLET BY MOUTH EVERY DAY 90 tablet 3   tadalafil  (CIALIS ) 5 MG tablet TAKE 1 TABLET DAILY 90 tablet 3   aspirin  81 MG tablet Take 81 mg by mouth daily.     No facility-administered medications prior to visit.     Per HPI unless specifically indicated in ROS section below Review of Systems  Constitutional:  Negative for fatigue and fever.  HENT:  Negative for ear pain.   Eyes:  Negative for pain.  Respiratory:  Negative for cough and shortness of breath.   Cardiovascular:  Negative for chest pain, palpitations and leg swelling.  Gastrointestinal:  Negative for abdominal pain.  Genitourinary:  Negative for dysuria.  Musculoskeletal:  Negative for arthralgias.  Neurological:  Negative for syncope, light-headedness and headaches.  Psychiatric/Behavioral:  Negative for dysphoric mood.    Objective:  BP 130/80   Pulse 66   Temp 97.7 F (36.5 C) (Temporal)   Ht 5' 7.25 (1.708 m)   Wt 173 lb 8 oz (78.7 kg)   SpO2 99%   BMI 26.97 kg/m   Wt Readings from Last 3 Encounters:  07/02/24 173 lb 8 oz (78.7 kg)  07/23/23 179 lb 12.8 oz (81.6 kg)  07/14/23 177 lb (80.3 kg)      Physical Exam Constitutional:      Appearance: He is well-developed.  HENT:     Head:  Normocephalic.     Right Ear: Hearing normal.     Left Ear: Hearing normal.     Nose: Nose normal.  Neck:     Thyroid: No thyroid mass or thyromegaly.     Vascular: No carotid bruit.     Trachea: Trachea normal.  Cardiovascular:     Rate and Rhythm: Normal rate and regular rhythm.     Pulses: Normal pulses.     Heart sounds: Heart sounds not distant. No murmur heard.    No friction rub. No gallop.     Comments: No peripheral edema Pulmonary:     Effort: Pulmonary effort is normal. No respiratory distress.     Breath sounds: Normal breath sounds.  Skin:    General: Skin is warm and dry.     Findings: No rash.  Psychiatric:        Speech: Speech normal.        Behavior: Behavior normal.        Thought Content: Thought content normal.       Results for orders placed or performed during the hospital encounter of 07/23/23  Surgical pathology   Collection Time: 07/23/23 12:00 AM  Result Value Ref Range   SURGICAL PATHOLOGY      SURGICAL PATHOLOGY [facility] [address], [address] [place], [place] [address] Telephone [phone] or [phone] Fax [phone]  REPORT OF SURGICAL PATHOLOGY   Accession #: [id number] Patient Name: Devin Stanton Visit # : [id number]  MRN: [id number] Physician: Unk Cotton DOB/Age 05-24-1961 (Age: 9) Gender: M Collected Date: 07/23/2023 Received Date: 07/23/2023  FINAL DIAGNOSIS       1. Ascending  Colon Polyp, x1 cbx, x1 cold snare :       -  TUBULAR ADENOMA.       DATE SIGNED OUT: 07/24/2023 ELECTRONIC SIGNATURE : Legolvan Do, Mark, Pathologist, Electronic Signature  MICROSCOPIC DESCRIPTION  CASE COMMENTS STAINS USED IN DIAGNOSIS: H&E    CLINICAL HISTORY  SPECIMEN(S) OBTAINED 1. Ascending  Colon Polyp, X1 Cbx, X1 Cold Snare  SPECIMEN COMMENTS: SPECIMEN CLINICAL INFORMATION: 1. Screening colonoscopy, polyps    Gross Description 1. Ascending colon polyp CBX x1 cold snare x1,  received in formalin is  a 1.5 x 1.0 x 0.2 cm aggregate of multiple tan-pink tissue fragments. The specimen is filtered and submitted in toto in 1 block (1A).      [facility] 07/23/2023        Report signed out from the following location(s) [place] [address] [place], [place] [address] CLIA #: [id number]  [facility] [address] [place], [place] [address] CLIA #:  [id number]     This visit occurred during the SARS-CoV-2 public health emergency.  Safety protocols were in place, including screening questions prior to the visit, additional usage of staff PPE, and extensive cleaning of exam room while observing appropriate contact time as indicated for disinfecting solutions.   COVID 19 screen:  No recent travel or known exposure to COVID19 The patient denies respiratory symptoms of COVID 19 at this time. The importance of social distancing was discussed today.   Assessment and Plan   The patient's preventative maintenance and recommended screening tests for an annual wellness exam were reviewed in full today. Brought up to date unless services declined.  Counselled on the importance of diet, exercise, and its role in overall health and mortality. The patient's FH and SH was reviewed, including their home life, tobacco status, and drug and alcohol status.    Check PSA.today Lab Results  Component Value Date   PSA 1.69 07/01/2023   PSA 0.86 06/20/2022   PSA 0.90 05/12/2020   Colonoscopy: 07/2023 Dr. Unk, polyps, repeat in 7 years Hep C done  Vaccine: uptodate with TDap . Recommended Prevnar.. given today. COVID vaccine and flu vaccine.. pt refused. Uptodate with shingrix.  Declined HIV testing.  Problem List Items Addressed This Visit     Essential hypertension, benign - Primary (Chronic)   Stable, chronic.  Continue current medication.   Good control on amlodipine  10 mg daily, benazepril  40 mg daily, metoprolol  50 mg daily       Family history of premature CAD (Chronic)   Followed by cardiology. On statin medication.      Hyperlipidemia (Chronic)    Due for re-eval.  on crestor  10 mg daily. Encouraged exercise, weight loss, healthy eating habits.       Relevant Orders   Lipid panel   Comprehensive metabolic panel with GFR   Prediabetes    Due for re-eval.      Relevant Orders   Hemoglobin A1c   Other Visit Diagnoses       Prostate cancer screening       Relevant Orders   PSA, Medicare        Greig Ring, MD   "

## 2024-07-02 NOTE — Assessment & Plan Note (Signed)
Stable, chronic.  Continue current medication.   Good control on amlodipine 10 mg daily, benazepril 40 mg daily, metoprolol 50 mg daily 

## 2024-07-05 ENCOUNTER — Telehealth: Payer: Self-pay | Admitting: Medical Genetics

## 2024-07-05 DIAGNOSIS — Z1589 Genetic susceptibility to other disease: Secondary | ICD-10-CM

## 2024-07-05 LAB — GENECONNECT MOLECULAR SCREEN: Genetic Analysis Overall Interpretation: POSITIVE — AB

## 2024-07-06 DIAGNOSIS — Z1509 Genetic susceptibility to other malignant neoplasm: Secondary | ICD-10-CM | POA: Insufficient documentation

## 2024-07-06 DIAGNOSIS — Z1589 Genetic susceptibility to other disease: Secondary | ICD-10-CM | POA: Insufficient documentation

## 2024-07-06 NOTE — Telephone Encounter (Signed)
 [facility] Positive Result Note 07/06/2024 10:42 AM  SECOND ATTEMPT: Confirmed I was speaking with Devin Stanton [id number] by using name and DOB. Informed participant the reason for this call is to provide results for the above study. Results revealed Hereditary Breast and Ovarian Syndrome. Genetic counseling was offered and participant declined. All questions were answered, and participant was thanked for their time and support of the above study. Participant was encouraged to contact [facility] if they have any further questions or concerns.

## 2024-07-07 ENCOUNTER — Encounter: Payer: Self-pay | Admitting: Family Medicine

## 2024-07-14 ENCOUNTER — Ambulatory Visit: Payer: Medicare Other

## 2024-09-23 ENCOUNTER — Ambulatory Visit
